# Patient Record
Sex: Female | Born: 1935 | Race: White | Hispanic: No | Marital: Married | Smoking: Never smoker
Health system: Southern US, Community
[De-identification: ages and names within clinical notes are randomized; demographics above are authoritative.]

## PROBLEM LIST (undated history)

## (undated) DIAGNOSIS — I4891 Unspecified atrial fibrillation: Secondary | ICD-10-CM

## (undated) DIAGNOSIS — I10 Essential (primary) hypertension: Secondary | ICD-10-CM

## (undated) DIAGNOSIS — C50919 Malignant neoplasm of unspecified site of unspecified female breast: Secondary | ICD-10-CM

## (undated) DIAGNOSIS — S32010A Wedge compression fracture of first lumbar vertebra, initial encounter for closed fracture: Secondary | ICD-10-CM

## (undated) DIAGNOSIS — G8929 Other chronic pain: Secondary | ICD-10-CM

## (undated) DIAGNOSIS — M5430 Sciatica, unspecified side: Secondary | ICD-10-CM

## (undated) DIAGNOSIS — M1990 Unspecified osteoarthritis, unspecified site: Secondary | ICD-10-CM

## (undated) DIAGNOSIS — M549 Dorsalgia, unspecified: Secondary | ICD-10-CM

## (undated) HISTORY — DX: Malignant neoplasm of unspecified site of unspecified female breast: C50.919

## (undated) HISTORY — PX: APPENDECTOMY: SHX54

## (undated) HISTORY — DX: Unspecified osteoarthritis, unspecified site: M19.90

## (undated) HISTORY — DX: Essential (primary) hypertension: I10

---

## 2002-11-17 DIAGNOSIS — S32010A Wedge compression fracture of first lumbar vertebra, initial encounter for closed fracture: Secondary | ICD-10-CM

## 2002-11-17 HISTORY — DX: Wedge compression fracture of first lumbar vertebra, initial encounter for closed fracture: S32.010A

## 2003-06-13 ENCOUNTER — Emergency Department (HOSPITAL_COMMUNITY): Admission: AD | Admit: 2003-06-13 | Discharge: 2003-06-13 | Payer: Self-pay | Admitting: Emergency Medicine

## 2003-06-13 ENCOUNTER — Encounter: Payer: Self-pay | Admitting: Emergency Medicine

## 2003-08-14 ENCOUNTER — Ambulatory Visit (HOSPITAL_COMMUNITY): Admission: RE | Admit: 2003-08-14 | Discharge: 2003-08-14 | Payer: Self-pay | Admitting: Unknown Physician Specialty

## 2003-08-14 ENCOUNTER — Encounter: Payer: Self-pay | Admitting: Unknown Physician Specialty

## 2004-09-26 ENCOUNTER — Ambulatory Visit: Payer: Self-pay | Admitting: Family Medicine

## 2005-01-07 ENCOUNTER — Encounter: Admission: RE | Admit: 2005-01-07 | Discharge: 2005-01-07 | Payer: Self-pay | Admitting: Rheumatology

## 2005-01-28 ENCOUNTER — Ambulatory Visit: Payer: Self-pay | Admitting: Family Medicine

## 2005-04-09 ENCOUNTER — Ambulatory Visit: Payer: Self-pay | Admitting: Family Medicine

## 2005-08-22 ENCOUNTER — Ambulatory Visit: Payer: Self-pay | Admitting: Family Medicine

## 2005-09-09 ENCOUNTER — Ambulatory Visit: Payer: Self-pay | Admitting: Family Medicine

## 2005-09-15 ENCOUNTER — Ambulatory Visit: Payer: Self-pay | Admitting: Family Medicine

## 2006-01-07 ENCOUNTER — Ambulatory Visit: Payer: Self-pay | Admitting: Family Medicine

## 2006-05-14 ENCOUNTER — Ambulatory Visit: Payer: Self-pay | Admitting: Family Medicine

## 2006-07-02 ENCOUNTER — Ambulatory Visit: Payer: Self-pay | Admitting: Family Medicine

## 2006-08-13 ENCOUNTER — Ambulatory Visit (HOSPITAL_COMMUNITY): Admission: RE | Admit: 2006-08-13 | Discharge: 2006-08-13 | Payer: Self-pay | Admitting: Ophthalmology

## 2006-09-11 ENCOUNTER — Ambulatory Visit: Payer: Self-pay | Admitting: Family Medicine

## 2006-11-02 ENCOUNTER — Ambulatory Visit: Payer: Self-pay | Admitting: Family Medicine

## 2007-01-22 ENCOUNTER — Ambulatory Visit: Payer: Self-pay | Admitting: Family Medicine

## 2007-01-26 ENCOUNTER — Ambulatory Visit: Payer: Self-pay | Admitting: Family Medicine

## 2007-01-29 ENCOUNTER — Ambulatory Visit: Payer: Self-pay | Admitting: Family Medicine

## 2007-02-02 ENCOUNTER — Ambulatory Visit: Payer: Self-pay | Admitting: Family Medicine

## 2007-04-15 ENCOUNTER — Ambulatory Visit (HOSPITAL_COMMUNITY): Admission: RE | Admit: 2007-04-15 | Discharge: 2007-04-15 | Payer: Self-pay | Admitting: Ophthalmology

## 2007-05-11 ENCOUNTER — Ambulatory Visit: Payer: Self-pay | Admitting: Family Medicine

## 2008-08-15 ENCOUNTER — Encounter: Admission: RE | Admit: 2008-08-15 | Discharge: 2008-08-15 | Payer: Self-pay | Admitting: Rheumatology

## 2008-09-01 ENCOUNTER — Encounter: Admission: RE | Admit: 2008-09-01 | Discharge: 2008-09-01 | Payer: Self-pay | Admitting: Rheumatology

## 2008-09-13 ENCOUNTER — Encounter: Admission: RE | Admit: 2008-09-13 | Discharge: 2008-09-13 | Payer: Self-pay | Admitting: Rheumatology

## 2008-11-08 ENCOUNTER — Ambulatory Visit (HOSPITAL_COMMUNITY): Admission: RE | Admit: 2008-11-08 | Discharge: 2008-11-08 | Payer: Self-pay | Admitting: Neurosurgery

## 2010-02-28 ENCOUNTER — Encounter: Admission: RE | Admit: 2010-02-28 | Discharge: 2010-02-28 | Payer: Self-pay | Admitting: Rheumatology

## 2010-03-04 ENCOUNTER — Encounter: Admission: RE | Admit: 2010-03-04 | Discharge: 2010-03-04 | Payer: Self-pay | Admitting: Rheumatology

## 2010-03-20 ENCOUNTER — Encounter: Admission: RE | Admit: 2010-03-20 | Discharge: 2010-03-20 | Payer: Self-pay | Admitting: Rheumatology

## 2010-04-11 ENCOUNTER — Encounter: Admission: RE | Admit: 2010-04-11 | Discharge: 2010-04-11 | Payer: Self-pay | Admitting: Rheumatology

## 2010-06-13 ENCOUNTER — Encounter (HOSPITAL_COMMUNITY): Admission: RE | Admit: 2010-06-13 | Discharge: 2010-08-08 | Payer: Self-pay | Admitting: Neurosurgery

## 2010-09-04 ENCOUNTER — Encounter: Admission: RE | Admit: 2010-09-04 | Discharge: 2010-09-04 | Payer: Self-pay | Admitting: Neurosurgery

## 2011-04-04 NOTE — Op Note (Signed)
NAME:  Betty Carpenter, Betty Carpenter                   ACCOUNT NO.:  [id number]   MEDICAL RECORD NO.:  [id number]          PATIENT TYPE:  AMB   LOCATION:  DAY                           FACILITY:  [facility]   PHYSICIAN:  Susanne Greenhouse, MD       DATE OF BIRTH:  06-14-1925   DATE OF PROCEDURE:  08/13/2006  DATE OF DISCHARGE:  08/13/2006                                 OPERATIVE REPORT   PREOPERATIVE DIAGNOSES:  Nuclear cataract left eye.   POSTOPERATIVE DIAGNOSES:  Nuclear cataract left eye.   OPERATIVE PROCEDURE:  Phacoemulsification, intraocular lens implantation  left eye.   SURGEON:  Susanne Greenhouse, MD   ANESTHESIA:  Topical with monitored anesthesia care.   OPERATIVE SUMMARY:  In the preoperative holding area, dilating drops and 2%  viscous lidocaine were placed into the left eye.  The patient was brought to  the operating room where the eye was prepped and draped.  A super sharp  blade was used to make a paracentesis port at the surgeon's 2 o'clock  position.  The anterior chamber was filled with 1% non-preserved lidocaine  solution followed by Amvisc plus.  A 2.85 mm keratome blade was used to make  a clear corneal incision at the supratemporal limbus.  A cystotome needle  was used to create a continuous tear capsulotomy.  Hydrodissection was  performed using balanced salt solution on a fine cannula.  The lens nucleus  was then removed using phacoemulsification in a cracking technique.  Residual cortex was removed with irrigation and aspiration.  The capsular  bag and anterior chamber were refilled with Amvisc plus.  A posterior  chamber intraocular lens was placed into the capsular bag using a Monarch  lens injecting system.  The Amvisc plus was then removed from the capsular  bag and anterior chamber with irrigation and aspiration.  Stromal hydration  of the main incision and paracentesis ports were performed with balanced  salt solution on a fine cannula.  The wound was tested for leak which was  negative.  No sutures were placed.  There were no operative complications.  The patient tolerated the procedure well and was returned to the recovery  area in satisfactory condition.  Prosthetic devices and Alcon AcrySof  posterior chamber intraocular lens model [id number], power [id number].           ______________________________  Susanne Greenhouse, MD     KEH/MEDQ  D:  08/13/2006  T:  08/15/2006  Job:  [id number]

## 2012-01-07 ENCOUNTER — Telehealth: Payer: Self-pay | Admitting: *Deleted

## 2012-01-07 NOTE — Telephone Encounter (Signed)
Patient's daughter called me back and I confirmed 01/28/12 appt w/ Nathanial Rancher.  Mailed before appt letter & packet to pt.

## 2012-01-28 ENCOUNTER — Ambulatory Visit: Payer: Medicare Other

## 2012-01-28 ENCOUNTER — Ambulatory Visit (HOSPITAL_BASED_OUTPATIENT_CLINIC_OR_DEPARTMENT_OTHER): Payer: Medicare Other | Admitting: Oncology

## 2012-01-28 ENCOUNTER — Encounter: Payer: Self-pay | Admitting: Oncology

## 2012-01-28 ENCOUNTER — Other Ambulatory Visit: Payer: Medicare Other

## 2012-01-28 VITALS — BP 144/70 | HR 75 | Temp 98.1°F | Ht 71.0 in | Wt 137.3 lb

## 2012-01-28 DIAGNOSIS — C50919 Malignant neoplasm of unspecified site of unspecified female breast: Secondary | ICD-10-CM

## 2012-01-28 DIAGNOSIS — Z171 Estrogen receptor negative status [ER-]: Secondary | ICD-10-CM

## 2012-01-28 NOTE — Patient Instructions (Signed)
1. You are scheduled to see Dr. Deatra Ina in [place] for discussion of breast surgery  2. You will see a medical oncologist in [place] for medical oncology services. I have discussed your case with Dr. Normand Sloop and his office will call you to set up an appointment

## 2012-01-28 NOTE — Progress Notes (Signed)
Did inform patient about financial application patient was interested so I gave patient EPP Application 01/28/12.

## 2012-01-29 ENCOUNTER — Telehealth: Payer: Self-pay | Admitting: Oncology

## 2012-01-29 NOTE — Progress Notes (Signed)
OFFICE PROGRESS NOTE  CC Dr. 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  CURRENT THERAPY:patient is undergoing evaluation and discussion of treatment options. She was seen as a new consultation.  INTERVAL HISTORY: Nathalia Wismer Buck 78 y.o. female with multiple medical problems including hypertension chronic back pain. The patient recently was seen in [place] for her annual screening mammogram. She was found to have a 1.4 cm right breast mass. She went on to have an ultrasound performed that showed a 1.4 cm hypoechoic mass. She does have a needle core biopsy performed in [place]. The pathology showed a invasive ductal carcinoma that was ER ER negative PR negative HER-2/neu negative (to triple negative). She was then referred to medical oncology for discussion of treatment options.  MEDICAL HISTORY: Past Medical History  Diagnosis Date  . Breast cancer   . Arthritis   . Hypertension     ALLERGIES:  is allergic to cymbalta.  MEDICATIONS:  Current Outpatient Prescriptions  Medication Sig Dispense Refill  . amLODipine (NORVASC) 5 MG tablet Take 5 mg by mouth.      Marland Kitchen aspirin 81 MG tablet Take 81 mg by mouth daily.      . Calcium Citrate-Vitamin D (CITRACAL + D PO) Take by mouth.      . fish oil-omega-3 fatty acids 1000 MG capsule Take 2 g by mouth daily.      Marland Kitchen lidocaine (LIDODERM) 5 % Place 1 patch onto the skin daily. Remove & Discard patch within 12 hours or as directed by MD      . LORazepam (ATIVAN) 0.5 MG tablet Take 0.5 mg by mouth every 8 (eight) hours as needed.      . magnesium 30 MG tablet Take 30 mg by mouth 3 (three) times daily.      . Multiple Vitamins-Minerals (CENTRUM SILVER PO) Take by mouth.      . oxyCODONE-acetaminophen (PERCOCET) 10-325 MG per tablet Take 1 tablet by mouth every 6 (six) hours as needed.        SURGICAL HISTORY:  Past  Surgical History  Procedure Date  . Appendectomy     REVIEW OF SYSTEMS:  Constitutional: positive for anorexia, fatigue and malaise Ears, nose, mouth, throat, and face: positive for patient has poor dentition but no difficulty in swallowing she has no hearing problems she has not noticed any sores in her mouth. Respiratory: positive for minimal shortness of breath on exertion Cardiovascular: negative Gastrointestinal: negative Integument/breast: positive for breast lump and breast tenderness Hematologic/lymphatic: negative Musculoskeletal:positive for arthralgias, back pain, bone pain, muscle weakness, myalgias and stiff joints Neurological: positive for coordination problems, gait problems, tremors and weakness   PHYSICAL EXAMINATION: General appearance: alert, cooperative, appears older than stated age, cachectic, moderate distress, pale and slowed mentation Neck: no adenopathy, no carotid bruit, no JVD, supple, symmetrical, trachea midline and thyroid not enlarged, symmetric, no tenderness/mass/nodules Lymph nodes: Cervical, supraclavicular, and axillary nodes normal. Resp: diminished breath sounds bilaterally Back: patient has back pain on percussion Cardio: regular rate and rhythm GI: soft, non-tender; bowel sounds normal; no masses,  no organomegaly Extremities: extremities normal, atraumatic, no cyanosis or edema Neurologic: Grossly normal Bilateral breast examination: right breast reveals a small palpable mass with no other associated masses no nipple discharge or inversion or retraction. ECOG PERFORMANCE STATUS: 2 - Symptomatic, <50% confined to bed  Blood pressure 144/70, pulse 75, temperature 98.1  F (36.7 C), temperature source Oral, height 5\' 11"  (1.803 m), weight 137 lb 4.8 oz (62.279 kg).  LABORATORY DATA: No results found for this basename: WBC, HGB, HCT, MCV, PLT      Chemistry   No results found for this basename: NA, K, CL, CO2, BUN, CREATININE, GLU   No results  found for this basename: CALCIUM, ALKPHOS, AST, ALT, BILITOT       RADIOGRAPHIC STUDIES:  No results found.  ASSESSMENT: 78 year old female with new diagnosis of triple-negative breast cancer. Patient has not been seen by surgery. I discussed the diagnosis with the patient and her daughter. He understands that she has a invasive ductal carcinoma the tumor is estrogen receptor progesterone receptor and HER-2/neu negative negative. They understand that she does need surgery up front. They understand that she is not a candidate for antiestrogen therapy due to the fact that her tumor is triple negative.   PLAN: I discussed extensively patient's pathology report with them. I also recommended that she be seen by a surgeon. Since patient lives in [place] my recommendation is that she be seen in [place] by one of the surgeons. An appointment with Dr. Olegario Messier was made for her for March 20. I also recommended the patient be seen and [place] at the cancer [facility]. I spoke to one of my partners regarding this patient. And he will have his office to see and call the patient in the next few days for a appointment for a full medical oncology evaluation. No recommendations for any kind of adjuvant treatments were made for the patient as I will be for this to my partners.   All questions were answered. The patient knows to call the clinic with any problems, questions or concerns. We can certainly see the patient much sooner if necessary.  I spent 40 minutes counseling the patient face to face. The total time spent in the appointment was 40 minutes.    Drue Second, MD Medical/Oncology [facility] [phone] (beeper) [phone] (Office)  01/29/2012, 6:26 PM

## 2012-01-29 NOTE — Telephone Encounter (Signed)
Please refer to dr Milta Deiters notes in epic regarding this pt transferring her care of serviice in [place] with dr Deatra Ina

## 2012-02-09 ENCOUNTER — Encounter: Payer: Self-pay | Admitting: *Deleted

## 2012-02-09 NOTE — Progress Notes (Signed)
Mailed after appt letter to pt. 

## 2012-04-01 ENCOUNTER — Encounter: Payer: Medicare Other | Admitting: Internal Medicine

## 2012-04-01 DIAGNOSIS — C50919 Malignant neoplasm of unspecified site of unspecified female breast: Secondary | ICD-10-CM

## 2012-10-28 ENCOUNTER — Encounter: Payer: Medicare Other | Admitting: Internal Medicine

## 2012-11-30 ENCOUNTER — Encounter: Payer: Medicare Other | Admitting: Internal Medicine

## 2012-11-30 DIAGNOSIS — C50919 Malignant neoplasm of unspecified site of unspecified female breast: Secondary | ICD-10-CM

## 2013-05-31 DIAGNOSIS — C50919 Malignant neoplasm of unspecified site of unspecified female breast: Secondary | ICD-10-CM

## 2014-11-28 DIAGNOSIS — N3946 Mixed incontinence: Secondary | ICD-10-CM | POA: Insufficient documentation

## 2015-02-13 DIAGNOSIS — H1132 Conjunctival hemorrhage, left eye: Secondary | ICD-10-CM | POA: Insufficient documentation

## 2015-09-17 DIAGNOSIS — Z9011 Acquired absence of right breast and nipple: Secondary | ICD-10-CM | POA: Insufficient documentation

## 2016-01-15 DIAGNOSIS — E785 Hyperlipidemia, unspecified: Secondary | ICD-10-CM | POA: Insufficient documentation

## 2016-05-13 DIAGNOSIS — D510 Vitamin B12 deficiency anemia due to intrinsic factor deficiency: Secondary | ICD-10-CM | POA: Insufficient documentation

## 2016-05-13 DIAGNOSIS — R5382 Chronic fatigue, unspecified: Secondary | ICD-10-CM | POA: Insufficient documentation

## 2016-08-18 DIAGNOSIS — D649 Anemia, unspecified: Secondary | ICD-10-CM | POA: Insufficient documentation

## 2017-10-22 ENCOUNTER — Inpatient Hospital Stay (HOSPITAL_COMMUNITY)
Admission: AD | Admit: 2017-10-22 | Discharge: 2017-10-27 | DRG: 871 | Disposition: A | Discharge status: Skilled Nursing Facility | Payer: Medicare Other | Source: Other Acute Inpatient Hospital | Attending: Internal Medicine | Admitting: Internal Medicine

## 2017-10-22 DIAGNOSIS — G9341 Metabolic encephalopathy: Secondary | ICD-10-CM | POA: Diagnosis present

## 2017-10-22 DIAGNOSIS — I10 Essential (primary) hypertension: Secondary | ICD-10-CM | POA: Diagnosis present

## 2017-10-22 DIAGNOSIS — I160 Hypertensive urgency: Secondary | ICD-10-CM | POA: Diagnosis present

## 2017-10-22 DIAGNOSIS — Z66 Do not resuscitate: Secondary | ICD-10-CM | POA: Diagnosis present

## 2017-10-22 DIAGNOSIS — Z853 Personal history of malignant neoplasm of breast: Secondary | ICD-10-CM

## 2017-10-22 DIAGNOSIS — A419 Sepsis, unspecified organism: Secondary | ICD-10-CM | POA: Diagnosis present

## 2017-10-22 DIAGNOSIS — K529 Noninfective gastroenteritis and colitis, unspecified: Secondary | ICD-10-CM | POA: Diagnosis present

## 2017-10-22 DIAGNOSIS — Z86718 Personal history of other venous thrombosis and embolism: Secondary | ICD-10-CM | POA: Diagnosis not present

## 2017-10-22 DIAGNOSIS — K8030 Calculus of bile duct with cholangitis, unspecified, without obstruction: Secondary | ICD-10-CM | POA: Diagnosis present

## 2017-10-22 DIAGNOSIS — F419 Anxiety disorder, unspecified: Secondary | ICD-10-CM | POA: Diagnosis present

## 2017-10-22 DIAGNOSIS — Z9010 Acquired absence of unspecified breast and nipple: Secondary | ICD-10-CM | POA: Diagnosis not present

## 2017-10-22 DIAGNOSIS — E876 Hypokalemia: Secondary | ICD-10-CM | POA: Diagnosis present

## 2017-10-22 DIAGNOSIS — R748 Abnormal levels of other serum enzymes: Secondary | ICD-10-CM | POA: Diagnosis present

## 2017-10-22 DIAGNOSIS — F17290 Nicotine dependence, other tobacco product, uncomplicated: Secondary | ICD-10-CM | POA: Diagnosis present

## 2017-10-22 DIAGNOSIS — Z7982 Long term (current) use of aspirin: Secondary | ICD-10-CM

## 2017-10-22 DIAGNOSIS — K8689 Other specified diseases of pancreas: Secondary | ICD-10-CM | POA: Diagnosis present

## 2017-10-22 DIAGNOSIS — M549 Dorsalgia, unspecified: Secondary | ICD-10-CM

## 2017-10-22 DIAGNOSIS — G8929 Other chronic pain: Secondary | ICD-10-CM | POA: Diagnosis present

## 2017-10-22 DIAGNOSIS — Z79891 Long term (current) use of opiate analgesic: Secondary | ICD-10-CM | POA: Diagnosis not present

## 2017-10-22 DIAGNOSIS — M545 Low back pain: Secondary | ICD-10-CM | POA: Diagnosis not present

## 2017-10-22 DIAGNOSIS — F1190 Opioid use, unspecified, uncomplicated: Secondary | ICD-10-CM | POA: Diagnosis not present

## 2017-10-22 LAB — CBC WITH DIFFERENTIAL/PLATELET
BASOS ABS: 0 10*3/uL (ref 0.0–0.1)
BASOS PCT: 0 %
EOS PCT: 0 %
Eosinophils Absolute: 0 10*3/uL (ref 0.0–0.7)
HCT: 40.3 % (ref 36.0–46.0)
Hemoglobin: 14.5 g/dL (ref 12.0–15.0)
Lymphocytes Relative: 3 %
Lymphs Abs: 0.5 10*3/uL — ABNORMAL LOW (ref 0.7–4.0)
MCH: 30 pg (ref 26.0–34.0)
MCHC: 36 g/dL (ref 30.0–36.0)
MCV: 83.3 fL (ref 78.0–100.0)
MONO ABS: 1.1 10*3/uL — AB (ref 0.1–1.0)
Monocytes Relative: 6 %
Neutro Abs: 16.5 10*3/uL — ABNORMAL HIGH (ref 1.7–7.7)
Neutrophils Relative %: 91 %
PLATELETS: 156 10*3/uL (ref 150–400)
RBC: 4.84 MIL/uL (ref 3.87–5.11)
RDW: 14 % (ref 11.5–15.5)
WBC: 18.1 10*3/uL — ABNORMAL HIGH (ref 4.0–10.5)

## 2017-10-22 LAB — COMPREHENSIVE METABOLIC PANEL
ALBUMIN: 3.5 g/dL (ref 3.5–5.0)
ALT: 175 U/L — ABNORMAL HIGH (ref 14–54)
ANION GAP: 12 (ref 5–15)
AST: 118 U/L — AB (ref 15–41)
Alkaline Phosphatase: 127 U/L — ABNORMAL HIGH (ref 38–126)
BUN: 26 mg/dL — AB (ref 6–20)
CHLORIDE: 101 mmol/L (ref 101–111)
CO2: 23 mmol/L (ref 22–32)
Calcium: 8.9 mg/dL (ref 8.9–10.3)
Creatinine, Ser: 0.64 mg/dL (ref 0.44–1.00)
GFR calc Af Amer: >60 mL/min (ref >60)
Glucose, Bld: 126 mg/dL — ABNORMAL HIGH (ref 65–99)
POTASSIUM: 2.8 mmol/L — AB (ref 3.5–5.1)
Sodium: 136 mmol/L (ref 135–145)
Total Bilirubin: 2.1 mg/dL — ABNORMAL HIGH (ref 0.3–1.2)
Total Protein: 6.8 g/dL (ref 6.5–8.1)

## 2017-10-22 LAB — LACTIC ACID, PLASMA
LACTIC ACID, VENOUS: 1.4 mmol/L (ref 0.5–1.9)
LACTIC ACID, VENOUS: 0.9 mmol/L (ref 0.5–1.9)

## 2017-10-22 LAB — TYPE AND SCREEN
ABO/RH(D): A POS
ANTIBODY SCREEN: NEGATIVE

## 2017-10-22 LAB — ABO/RH: ABO/RH(D): A POS

## 2017-10-22 MED ORDER — METRONIDAZOLE IN NACL 5-0.79 MG/ML-% IV SOLN
500.0000 mg | Freq: Three times a day (TID) | INTRAVENOUS | Status: DC
  Administered 2017-10-22 – 2017-10-23 (×2): 500 mg via INTRAVENOUS
  Filled 2017-10-22 (×3): qty 100

## 2017-10-22 MED ORDER — HYDROMORPHONE HCL 1 MG/ML IJ SOLN
0.5000 mg | INTRAMUSCULAR | Status: AC
  Administered 2017-10-22: 0.5 mg via INTRAVENOUS
  Filled 2017-10-22: qty 0.5

## 2017-10-22 MED ORDER — ACETAMINOPHEN 325 MG PO TABS
650.0000 mg | ORAL_TABLET | Freq: Four times a day (QID) | ORAL | Status: DC | PRN
  Administered 2017-10-24 – 2017-10-27 (×4): 650 mg via ORAL
  Filled 2017-10-22 (×4): qty 2

## 2017-10-22 MED ORDER — PIPERACILLIN-TAZOBACTAM 3.375 G IVPB 30 MIN
3.3750 g | Freq: Once | INTRAVENOUS | Status: AC
  Administered 2017-10-22: 3.375 g via INTRAVENOUS
  Filled 2017-10-22: qty 50

## 2017-10-22 MED ORDER — ONDANSETRON HCL 4 MG PO TABS: 4.0000 mg | ORAL_TABLET | Freq: Four times a day (QID) | ORAL | Status: DC | PRN

## 2017-10-22 MED ORDER — LABETALOL HCL 5 MG/ML IV SOLN
5.0000 mg | INTRAVENOUS | Status: DC | PRN
  Administered 2017-10-22 – 2017-10-24 (×3): 5 mg via INTRAVENOUS
  Filled 2017-10-22 (×3): qty 4

## 2017-10-22 MED ORDER — LORAZEPAM 2 MG/ML IJ SOLN
0.5000 mg | Freq: Two times a day (BID) | INTRAMUSCULAR | Status: DC | PRN
  Administered 2017-10-23: 0.5 mg via INTRAVENOUS
  Filled 2017-10-22: qty 1

## 2017-10-22 MED ORDER — ONDANSETRON HCL 4 MG/2ML IJ SOLN: 4.0000 mg | Freq: Four times a day (QID) | INTRAMUSCULAR | Status: DC | PRN

## 2017-10-22 MED ORDER — ACETAMINOPHEN 650 MG RE SUPP: 650.0000 mg | Freq: Four times a day (QID) | RECTAL | Status: DC | PRN

## 2017-10-22 MED ORDER — IPRATROPIUM-ALBUTEROL 0.5-2.5 (3) MG/3ML IN SOLN: 3.0000 mL | Freq: Four times a day (QID) | RESPIRATORY_TRACT | Status: DC | PRN

## 2017-10-22 MED ORDER — HYDROMORPHONE HCL 1 MG/ML IJ SOLN
0.5000 mg | INTRAMUSCULAR | Status: DC | PRN
  Administered 2017-10-22 – 2017-10-24 (×7): 0.5 mg via INTRAVENOUS
  Filled 2017-10-22 (×8): qty 0.5

## 2017-10-22 MED ORDER — SODIUM CHLORIDE 0.9 % IV SOLN
INTRAVENOUS | Status: DC
  Administered 2017-10-22: 21:00:00 via INTRAVENOUS

## 2017-10-22 MED ORDER — PIPERACILLIN-TAZOBACTAM 3.375 G IVPB
3.3750 g | Freq: Three times a day (TID) | INTRAVENOUS | Status: DC
  Administered 2017-10-23 – 2017-10-27 (×14): 3.375 g via INTRAVENOUS
  Filled 2017-10-22 (×17): qty 50

## 2017-10-22 NOTE — Progress Notes (Signed)
Pharmacy Antibiotic Note  Betty Carpenter is a 83 y.o. female admitted on 10/22/2017 with intra-abdominal infection.  Pharmacy has been consulted for zosyn dosing.  Plan: Zosyn 3.375mg  IV q8h EI Flagyl 500mg  IV q8h per MD F/u renal function     Temp (24hrs), Avg:98 F (36.7 C), Min:98 F (36.7 C), Max:98 F (36.7 C)  No results for input(s): WBC, CREATININE, LATICACIDVEN, VANCOTROUGH, VANCOPEAK, VANCORANDOM, GENTTROUGH, GENTPEAK, GENTRANDOM, TOBRATROUGH, TOBRAPEAK, TOBRARND, AMIKACINPEAK, AMIKACINTROU, AMIKACIN in the last 168 hours.  CrCl cannot be calculated (No order found.).    Allergies  Allergen Reactions  . Cymbalta [Duloxetine Hcl]     " couldn't make water"- unable to urinate    Antimicrobials this admission: 12/6 zosyn >> 12/6 flagyl >>  Microbiology results: 12/6 BCx: sent   Thank you for allowing pharmacy to be a part of this patient's care.  Dolly Rias RPh 10/22/2017, 9:01 PM Pager [phone]

## 2017-10-22 NOTE — H&P (Signed)
History and Physical    Betty Carpenter NGE:[id number] DOB: [birth year] DOA: 10/22/2017  Referring MD/NP/PA: Dr. Levora Angel PCP: Betty Carpenter, No Pcp Per  Betty Carpenter coming from: Assisted living facility to [facility] transfer to [facility]  Chief Complaint: Abdominal pain  I have personally briefly reviewed Betty Carpenter's old medical records and those records sent with her from [facility].   HPI: Betty Carpenter is a 83 y.o. female with medical history significant of HTN, chronic back pain, chronic opioid use, DVT on anticoagulation of Xarelto, and breast cancer s/p mastectomy; who initially presented to [facility] on Tuesday October 20, 2017 with complaints of severe abdominal pain with nausea and vomiting.  Betty Carpenter vomited several times prior to arrival and it was noted to be nonbloody in appearance.  Associated symptoms included at least one episode of loose stools, and back pain which is chronic.  Prior to onset of symptoms Betty Carpenter had been in her normal state of health, and is a resident of [facility] in and ambulates utilizing a walker.   After initial evaluation Betty Carpenter was noted to have a lab work on 12/4 showed lactic acid of 2.7 with a repeat trending upward to 3.1.White blood cell count count was initially noted to be normal at 6. CMP with GFR was noted to be relatively within normal limits, except anion gap 19, total bilirubin 2.2, AST 398.7, ALT 220, alkaline phosphatase 110, albumin 4.9.  Previous records noted that these levels have been previously normal. Urinalysis showed signs of trace protein, small amount of blood, urobilinogen high at 4, and trace leukocyte esterase.   CT scan of the abdomen showed diffuse colonic wall thickening and pericolonic fat stranding compatible with mild colitis and severe intrahepatic biliary duct dilatation with common bile duct dilatation of 15 mm and main pancreatic duct dilatation to 7 mm progressed from 2015, and a new 7 mm cystic  lesion in the pancreatic body.  Blood and urine cultures were obtained and Betty Carpenter was started on empiric antibiotics of Zosyn.   Due to persistent nausea and vomiting NG tube ultimately had to be placed to low intermittent suction.  Family notes general surgery was consulted and recommended MRCP. MRCP was obtained on hospital day 2, but did not show any clear signs of a calculus within the distal common bile duct.   there was persistent dilatation of the common bile duct and intrahepatic ducts which was mildly progressed and periampullary duodenal diverticulum noted.  Labs from 12/6 revealed WBC 19.5, hemoglobin 16, platelets 192, INR 1.3, sodium 136, potassium 3.4, chloride 96, CO2 21.5, BUN 25, creatinine 0.71, total bilirubin 4.2, direct bilirubin 3, indirect 1.2, AST 225.8, ALT 274, alkaline phosphatase 180, lipase 19.  General surgery recommended GI consultation, but those services were not available at the facility.  [facility] called to transfer the Betty [facility] to [facility] for need of ERCP and GI consultative services.   ED Course: as seen above  Review of Systems  Constitutional: Positive for chills, malaise/fatigue and weight loss.  HENT: Negative for ear discharge and nosebleeds.   Eyes: Negative for pain and discharge.  Respiratory: Negative for hemoptysis and sputum production.   Cardiovascular: Negative for chest pain and leg swelling.  Gastrointestinal: Positive for abdominal pain, diarrhea, nausea and vomiting.  Genitourinary: Negative for dysuria and urgency.  Musculoskeletal: Positive for back pain.  Skin: Negative for itching.  Neurological: Negative for focal weakness and loss of consciousness.    Past Medical History:  Diagnosis  Date  . Arthritis   . Breast cancer   . Hypertension     Past Surgical History:  Procedure Laterality Date  . APPENDECTOMY       reports that  has never smoked. she has never used smokeless tobacco. Her alcohol and drug histories are not on  file.  Allergies  Allergen Reactions  . Cymbalta [Duloxetine Hcl]     " couldn't make water"- unable to urinate    Family History  Problem Relation Age of Onset  . Cancer Maternal Grandfather     Prior to Admission medications   Medication Sig Start Date End Date Taking? Authorizing Provider  amLODipine (NORVASC) 5 MG tablet Take 5 mg by mouth.    [provider]  aspirin 81 MG tablet Take 81 mg by mouth daily.    [provider]  Calcium Citrate-Vitamin D (CITRACAL + D PO) Take by mouth.    [provider]  fish oil-omega-3 fatty acids 1000 MG capsule Take 2 g by mouth daily.    [provider]  lidocaine (LIDODERM) 5 % Place 1 patch onto the skin daily. Remove & Discard patch within 12 hours or as directed by MD    [provider]  LORazepam (ATIVAN) 0.5 MG tablet Take 0.5 mg by mouth every 8 (eight) hours as needed.    [provider]  magnesium 30 MG tablet Take 30 mg by mouth 3 (three) times daily.    [provider]  Multiple Vitamins-Minerals (CENTRUM SILVER PO) Take by mouth.    [provider]  oxyCODONE-acetaminophen (PERCOCET) 10-325 MG per tablet Take 1 tablet by mouth every 6 (six) hours as needed.    [provider]    Physical Exam:  Constitutional: frail elderly female in mild discomfort.  Vitals:   10/22/17 1915  BP: (!) (P) 183/98  Pulse: (!) (P) 115  Temp: (P) 98 F (36.7 C)  TempSrc: (P) Oral  SpO2: (P) 98%   Eyes: PERRL, lids and conjunctivae normal ENMT: Mucous membranes are dry. NGT in place. Posterior pharynx clear of any exudate or lesions. Neck: normal, supple, no masses, no thyromegaly Respiratory: clear to auscultation bilaterally, no wheezing, no crackles. Normal respiratory effort. No accessory muscle use.  Cardiovascular: Tachycardic, no murmurs / rubs / gallops. No extremity edema. 2+ pedal pulses. No carotid bruits.  Abdomen: no tenderness, no masses palpated.  No hepatosplenomegaly. Bowel sounds positive.  Musculoskeletal: no clubbing / cyanosis. No joint deformity upper and lower extremities. Good ROM, no contractures. Normal muscle tone.  Skin: no rashes, lesions, ulcers. No induration Neurologic: CN 2-12 grossly intact. Sensation intact, DTR normal. Strength 5/5 in all 4.  Psychiatric: Normal judgment and insight. Alert and oriented x 3. Normal mood.     Labs on Admission: I have personally reviewed following labs and imaging studies  CBC: No results for input(s): WBC, NEUTROABS, HGB, HCT, MCV, PLT in the last 168 hours. Basic Metabolic Panel: No results for input(s): NA, K, CL, CO2, GLUCOSE, BUN, CREATININE, CALCIUM, MG, PHOS in the last 168 hours. GFR: CrCl cannot be calculated (No order found.). Liver Function Tests: No results for input(s): AST, ALT, ALKPHOS, BILITOT, PROT, ALBUMIN in the last 168 hours. No results for input(s): LIPASE, AMYLASE in the last 168 hours. No results for input(s): AMMONIA in the last 168 hours. Coagulation Profile: No results for input(s): INR, PROTIME in the last 168 hours. Cardiac Enzymes: No results for input(s): CKTOTAL, CKMB, CKMBINDEX, TROPONINI in the last 168  hours. BNP (last 3 results) No results for input(s): PROBNP in the last 8760 hours. HbA1C: No results for input(s): HGBA1C in the last 72 hours. CBG: No results for input(s): GLUCAP in the last 168 hours. Lipid Profile: No results for input(s): CHOL, HDL, LDLCALC, TRIG, CHOLHDL, LDLDIRECT in the last 72 hours. Thyroid Function Tests: No results for input(s): TSH, T4TOTAL, FREET4, T3FREE, THYROIDAB in the last 72 hours. Anemia Panel: No results for input(s): VITAMINB12, FOLATE, FERRITIN, TIBC, IRON, RETICCTPCT in the last 72 hours. Urine analysis: No results found for: COLORURINE, APPEARANCEUR, LABSPEC, PHURINE, GLUCOSEU, HGBUR, BILIRUBINUR, KETONESUR, PROTEINUR, UROBILINOGEN, NITRITE, LEUKOCYTESUR Sepsis Labs: No results found for this  or any previous visit (from the past 240 hour(s)).   Radiological Exams on Admission: No results found.  EKG: Independently reviewed.  Normal sinus rhythm  Assessment/Plan Sepsis 2/2suspected ascending cholangitis, abdominal pain,N/V: Acute.  Betty Carpenter presented with symptoms of acute onset of abdominal pain with nausea and vomiting.    Betty Carpenter met sepsis criteria with tachycardia, WBC elevated up to 19.5, and lactic acid seen as high as 3.1 at the outside facility. Workup showed significant dilatation of the biliary and pancreatic ducts, but no stones seen on MRCP. Transferred to our facility for need of GI consultative services and possible ERCP. - Admit to telemetry bed - NPO - Continue NGT to intermittent suction - Antiemetics prn N/V - recheck blood cultures - [facility] with [facility] regarding urine/blood culture results - Stat lactic acid level  - Continue Zosyn per pharmacy  - Consulted Dr. Percell Miller of [facility], recommended addition of metronidazole antibiotics and plan for ERCP in a.m.  Acute encephalopathy: Betty Carpenter intermittently confused.  Question of symptoms secondary to acute infection. Unclear if there is baseline dementia and this could possibly be sundowning. - Neurochecks   Hypertensive urgency: Initial blood pressures noted systolics as high as 244. - IV labetalol 5 mg  prn >sbp180 or >dBP 110  Anxiety - Ativan IV prn Anxiety  Chronic back pain with chronic opioid use:  Betty Carpenter suffers from low back pain history of bulging disks.  Previously taking 30 mg of morphine sulfate twice daily along with 20 mg of oxycodone prn q4hr prn pain. - IV Dilaudid prn pain - possibly consider need of PCA pump   History of DVT: Betty Carpenter off Xarelto since 10/21/2017.  Currently holding for possible need of ERCP in a.m. - Restart anticoagulation when able  History of breast cancer: Betty Carpenter status post mastectomy and reportedly did not require chemoradiation.  Tobacco abuse:  Daughter notes that the Betty Carpenter dips snuff. - May consider nicotine patch  DVT prophylaxis: SCDs Code Status: DNR/DNI Family Communication: Discussed plan of care with Betty Carpenter and family present at bedside Disposition Plan: TBD Consults called: GI consult Admission status: Inpatient   Norval Morton MD [facility] Pager [phone]   If 7PM-7AM, please contact night-coverage [email or web address] Password [email or web address]  10/22/2017, 8:02 PM

## 2017-10-23 ENCOUNTER — Inpatient Hospital Stay (HOSPITAL_COMMUNITY): Payer: Medicare Other

## 2017-10-23 ENCOUNTER — Encounter (HOSPITAL_COMMUNITY): Payer: Self-pay

## 2017-10-23 ENCOUNTER — Other Ambulatory Visit: Payer: Self-pay

## 2017-10-23 DIAGNOSIS — I10 Essential (primary) hypertension: Secondary | ICD-10-CM | POA: Insufficient documentation

## 2017-10-23 DIAGNOSIS — Z86718 Personal history of other venous thrombosis and embolism: Secondary | ICD-10-CM

## 2017-10-23 DIAGNOSIS — Z853 Personal history of malignant neoplasm of breast: Secondary | ICD-10-CM

## 2017-10-23 DIAGNOSIS — I160 Hypertensive urgency: Secondary | ICD-10-CM | POA: Diagnosis present

## 2017-10-23 DIAGNOSIS — F1190 Opioid use, unspecified, uncomplicated: Secondary | ICD-10-CM | POA: Diagnosis present

## 2017-10-23 DIAGNOSIS — M549 Dorsalgia, unspecified: Secondary | ICD-10-CM

## 2017-10-23 DIAGNOSIS — G8929 Other chronic pain: Secondary | ICD-10-CM | POA: Diagnosis present

## 2017-10-23 LAB — CBC WITH DIFFERENTIAL/PLATELET
Basophils Absolute: 0 10*3/uL (ref 0.0–0.1)
Basophils Relative: 0 %
EOS PCT: 0 %
Eosinophils Absolute: 0 10*3/uL (ref 0.0–0.7)
HCT: 42.3 % (ref 36.0–46.0)
Hemoglobin: 14.7 g/dL (ref 12.0–15.0)
LYMPHS ABS: 0.8 10*3/uL (ref 0.7–4.0)
LYMPHS PCT: 5 %
MCH: 29.2 pg (ref 26.0–34.0)
MCHC: 34.8 g/dL (ref 30.0–36.0)
MCV: 83.9 fL (ref 78.0–100.0)
MONO ABS: 0.3 10*3/uL (ref 0.1–1.0)
MONOS PCT: 2 %
Neutro Abs: 13.9 10*3/uL — ABNORMAL HIGH (ref 1.7–7.7)
Neutrophils Relative %: 93 %
PLATELETS: 144 10*3/uL — AB (ref 150–400)
RBC: 5.04 MIL/uL (ref 3.87–5.11)
RDW: 14 % (ref 11.5–15.5)
WBC: 15 10*3/uL — AB (ref 4.0–10.5)

## 2017-10-23 LAB — MRSA PCR SCREENING: MRSA BY PCR: NEGATIVE

## 2017-10-23 LAB — COMPREHENSIVE METABOLIC PANEL
ALK PHOS: 124 U/L (ref 38–126)
ALT: 151 U/L — AB (ref 14–54)
ANION GAP: 10 (ref 5–15)
AST: 98 U/L — ABNORMAL HIGH (ref 15–41)
Albumin: 3.7 g/dL (ref 3.5–5.0)
BUN: 26 mg/dL — ABNORMAL HIGH (ref 6–20)
CALCIUM: 8.8 mg/dL — AB (ref 8.9–10.3)
CHLORIDE: 103 mmol/L (ref 101–111)
CO2: 24 mmol/L (ref 22–32)
CREATININE: 0.72 mg/dL (ref 0.44–1.00)
Glucose, Bld: 124 mg/dL — ABNORMAL HIGH (ref 65–99)
Potassium: 3.1 mmol/L — ABNORMAL LOW (ref 3.5–5.1)
Sodium: 137 mmol/L (ref 135–145)
Total Bilirubin: 1.5 mg/dL — ABNORMAL HIGH (ref 0.3–1.2)
Total Protein: 6.8 g/dL (ref 6.5–8.1)

## 2017-10-23 LAB — MAGNESIUM: Magnesium: 2.4 mg/dL (ref 1.7–2.4)

## 2017-10-23 MED ORDER — AMLODIPINE BESYLATE 10 MG PO TABS
10.0000 mg | ORAL_TABLET | Freq: Every day | ORAL | Status: DC
  Administered 2017-10-23 – 2017-10-27 (×5): 10 mg via ORAL
  Filled 2017-10-23 (×5): qty 1

## 2017-10-23 MED ORDER — ALBUMIN HUMAN 25 % IV SOLN
12.5000 g | INTRAVENOUS | Status: AC
  Administered 2017-10-23: 12.5 g via INTRAVENOUS
  Filled 2017-10-23: qty 50

## 2017-10-23 MED ORDER — POTASSIUM CHLORIDE IN NACL 40-0.9 MEQ/L-% IV SOLN
INTRAVENOUS | Status: DC
  Administered 2017-10-23 – 2017-10-24 (×2): 75 mL/h via INTRAVENOUS
  Filled 2017-10-23 (×2): qty 1000

## 2017-10-23 MED ORDER — POTASSIUM CHLORIDE 10 MEQ/100ML IV SOLN
10.0000 meq | INTRAVENOUS | Status: AC
  Administered 2017-10-23 (×3): 10 meq via INTRAVENOUS
  Filled 2017-10-23 (×3): qty 100

## 2017-10-23 MED ORDER — POTASSIUM CHLORIDE IN NACL 40-0.9 MEQ/L-% IV SOLN
INTRAVENOUS | Status: DC
  Administered 2017-10-23: 100 mL/h via INTRAVENOUS
  Filled 2017-10-23 (×2): qty 1000

## 2017-10-23 MED ORDER — MAGNESIUM SULFATE 2 GM/50ML IV SOLN
2.0000 g | Freq: Once | INTRAVENOUS | Status: AC
  Administered 2017-10-23: 2 g via INTRAVENOUS
  Filled 2017-10-23: qty 50

## 2017-10-23 MED ORDER — SODIUM CHLORIDE 0.9 % IV SOLN: INTRAVENOUS | Status: DC

## 2017-10-23 NOTE — Progress Notes (Signed)
[facility] PROGRESS NOTE  Betty Carpenter DXA:[id number] DOB: [birth year] DOA: 10/22/2017 PCP: Patient, No Pcp Per  Brief summary   83 y.o. female with medical history significant of HTN, chronic back pain, chronic opioid use, DVT on anticoagulation of Xarelto, and breast cancer s/p mastectomy; who initially presented to [facility] on Tuesday October 20, 2017 with complaints of severe abdominal pain with nausea and vomiting.  Patient vomited several times prior to arrival and it was noted to be nonbloody in appearance.    After initial evaluation patient was noted to have a lab work on 12/4 showed lactic acid of 2.7 with a repeat trending upward to 3.1.White blood cell count count was initially noted to be normal at 6. CMP with GFR was noted to be relatively within normal limits, except anion gap 19, total bilirubin 2.2, AST 398.7, ALT 220, alkaline phosphatase 110, albumin 4.9.  Previous records noted that these levels have been previously normal. Urinalysis showed signs of trace protein, small amount of blood, urobilinogen high at 4, and trace leukocyte esterase.   CT scan of the abdomen showed diffuse colonic wall thickening and pericolonic fat stranding compatible with mild colitis and severe intrahepatic biliary duct dilatation with common bile duct dilatation of 15 mm and main pancreatic duct dilatation to 7 mm progressed from 2015, and a new 7 mm cystic lesion in the pancreatic body.  Blood and urine cultures were obtained and patient was started on empiric antibiotics of Zosyn.   Due to persistent nausea and vomiting NG tube ultimately had to be placed to low intermittent suction.  Family notes general surgery was consulted and recommended MRCP. MRCP was obtained on hospital day 2, but did not show any clear signs of a calculus within the distal common bile duct.   there was persistent dilatation of the common bile duct and intrahepatic ducts which was mildly progressed and  periampullary duodenal diverticulum noted.  Labs from 12/6 revealed WBC 19.5, hemoglobin 16, platelets 192, INR 1.3, sodium 136, potassium 3.4, chloride 96, CO2 21.5, BUN 25, creatinine 0.71, total bilirubin 4.2, direct bilirubin 3, indirect 1.2, AST 225.8, ALT 274, alkaline phosphatase 180, lipase 19.  General surgery recommended GI consultation, but those services were not available at the facility.  [facility] called to transfer the patient to [facility] for need of ERCP and GI consultative services    Assessment/Plan:  Possible sepsis with cholangitis, abdominal pain, N/V: Acute. Patient presented with symptoms of acute onset of abdominal pain with nausea and vomiting. Patient met sepsis criteria with tachycardia, WBC elevated up to 19.5, and lactic acid seen as high as 3.1 at the outside facility. Workup showed significant dilatation of the biliary and pancreatic ducts, but no stones seen on MRCP.  -LFTs are improving, no abdominal pains, clinically improving with iv antibiotics, pend cultures. Cont iv fluids, supportive care,. GI plans no ERCP, recommended conservative treatment    Acute encephalopathy: possible due to infection. Neuro exam is non focal. Improving  Hypertensive urgency: Initial blood pressures noted systolics as high as 209. Resume amlodipine/increased. May need low dose BB. Cont IV labetalol 5 mg prn >sbp180 or >dBP 110   Anxiety.  Ativan IV prn Anxiety   Chronic back pain with chronic opioid use: Patient suffers from low back pain history of bulging disks. Previously taking 30 mg of morphine sulfate twice daily along with 20 mg of oxycodone prn q4hr prn pain. Cont monitor on opioids. Stable   History of DVT: Patient  off Xarelto since 10/21/2017. Family reports single episode of DVT in the past. Will recheck legs. Recommended to discuss with her PCP to discontinue  xarelto if no recurrent DVT or risks    History of breast cancer: Patient status post mastectomy and reportedly did  not require chemoradiation.   Code Status: DNR Family Communication: d/w patient, her family. RN (indicate person spoken with, relationship, and if by phone, the number) Disposition Plan:  24-48 hrs. ILF   Consultants:  GI  Procedures:  none  Antibiotics: Anti-infectives (From admission, onward)   Start     Dose/Rate Route Frequency Ordered Stop   10/23/17 0400  piperacillin-tazobactam (ZOSYN) IVPB 3.375 g     3.375 g 12.5 mL/hr over 240 Minutes Intravenous Every 8 hours 10/22/17 2102     10/22/17 2200  metroNIDAZOLE (FLAGYL) IVPB 500 mg  Status:  Discontinued     500 mg 100 mL/hr over 60 Minutes Intravenous Every 8 hours 10/22/17 2014 10/23/17 1256   10/22/17 2100  piperacillin-tazobactam (ZOSYN) IVPB 3.375 g     3.375 g 100 mL/hr over 30 Minutes Intravenous  Once 10/22/17 2014 10/22/17 2120        (indicate start date, and stop date if known)  HPI/Subjective: Alert. No distress. Reports feeling better   Objective: Vitals:   10/23/17 0715 10/23/17 0755  BP: (!) 203/116 (!) 186/95  Pulse:  72  Resp:  18  Temp:    SpO2:  98%    Intake/Output Summary (Last 24 hours) at 10/23/2017 1243 Last data filed at 10/23/2017 0645 Gross per 24 hour  Intake 1135 ml  Output 810 ml  Net 325 ml   Filed Weights   10/23/17 0700  Weight: 55.9 kg (123 lb 3.8 oz)    Exam:   General:  No distress   Cardiovascular: s1,s2 rrr  Respiratory: CTA BL  Abdomen: soft, nt, nd   Musculoskeletal: no leg edema    Data Reviewed: Basic Metabolic Panel: Recent Labs  Lab 10/22/17 2309 10/23/17 0801  NA 136 137  K 2.8* 3.1*  CL 101 103  CO2 23 24  GLUCOSE 126* 124*  BUN 26* 26*  CREATININE 0.64 0.72  CALCIUM 8.9 8.8*  MG  --  2.4   Liver Function Tests: Recent Labs  Lab 10/22/17 2309 10/23/17 0801  AST 118* 98*  ALT 175* 151*  ALKPHOS 127* 124  BILITOT 2.1* 1.5*  PROT 6.8 6.8  ALBUMIN 3.5 3.7   No results for input(s): LIPASE, AMYLASE in the last 168 hours. No  results for input(s): AMMONIA in the last 168 hours. CBC: Recent Labs  Lab 10/22/17 2309 10/23/17 0801  WBC 18.1* 15.0*  NEUTROABS 16.5* 13.9*  HGB 14.5 14.7  HCT 40.3 42.3  MCV 83.3 83.9  PLT 156 144*   Cardiac Enzymes: No results for input(s): CKTOTAL, CKMB, CKMBINDEX, TROPONINI in the last 168 hours. BNP (last 3 results) No results for input(s): BNP in the last 8760 hours.  ProBNP (last 3 results) No results for input(s): PROBNP in the last 8760 hours.  CBG: No results for input(s): GLUCAP in the last 168 hours.  No results found for this or any previous visit (from the past 240 hour(s)).   Studies: No results found.  Scheduled Meds: Continuous Infusions: . sodium chloride Stopped (10/23/17 0230)  . 0.9 % NaCl with KCl 40 mEq / L 100 mL/hr (10/23/17 0235)  . metronidazole Stopped (10/23/17 0741)  . piperacillin-tazobactam (ZOSYN)  IV 3.375 g (10/23/17 0935)      Principal Problem:   Sepsis (HCC) Active Problems:   History of breast cancer   History of DVT (deep vein thrombosis)   Chronic back pain   Hypertensive urgency   Chronic, continuous use of opioids    Time spent: >35 minutes     Suezette Lafave N  [facility] Pager [phone]. If 7PM-7AM, please contact night-coverage at [email or web address], password [email or web address] 10/23/2017, 12:43 PM  LOS: 1 day             

## 2017-10-23 NOTE — Care Management Note (Signed)
Case Management Note  Patient Details  Name: Betty Carpenter MRN: [id number] Date of Birth: 02/10/1925  Subjective/Objective:                  colitis  Action/Plan: Date: October 23, 2017 Velva Harman, BSN, [place], [place] Chart and notes review for patient progress and needs. Will follow for case management and discharge needs. Next review date: [id number] Expected Discharge Date:                  Expected Discharge Plan:  Assisted Living / Rest Home  In-House Referral:  Clinical Social Work  Discharge planning Services  CM Consult  Post Acute Care Choice:    Choice offered to:     DME Arranged:    DME Agency:     HH Arranged:    HH Agency:     Status of Service:  In process, will continue to follow  If discussed at Long Length of Stay Meetings, dates discussed:    Additional Comments:  Leeroy Cha, RN 10/23/2017, 8:34 AM

## 2017-10-23 NOTE — Progress Notes (Signed)
LE venous duplex prelim: negative for DVT. Betty Carpenter Eunice, RDMS, RVT  

## 2017-10-23 NOTE — Consult Note (Signed)
Referring Provider: Dr. Daleen Bo Primary Care Physician:  Patient, No Pcp Per Primary Gastroenterologist:  Althia Forts  Reason for Consultation:  Abdominal pain; Elevated liver enzymes  HPI: Betty Carpenter is a 83 y.o. female with multiple medical problems on Xarelto for DVT transferred from [facility] due to lack of GI coverage with abdominal pain, nausea, and vomiting with elevated LFTs and concern for possible cholangitis by the outside hospital. She has been having severe upper quadrant abdominal pain that started earlier this week. TB 2.2, ALP 110, AST 398, ALT 220, Lipase 19 at outside hospital. CT showed CBD dilation to 15 mm and pancreatic ductal dilation (to 7 mm that has progressed since 2015) and a new 7 mm cystic lesion in the pancreatic body. MRCP negative for CBD stone. Persistent dilation of CBD and intrahepatic ducts noted. Currently, patient is lying in bed in no acute distress complaining of back pain that she has chronically. Denies abdominal pain. Afebrile. WBC 18.1 yesterday. On empiric Zosyn. No family in room. Nurse at bedside.  Past Medical History:  Diagnosis Date  . Arthritis   . Breast cancer ([place])   . Hypertension     Past Surgical History:  Procedure Laterality Date  . APPENDECTOMY      Prior to Admission medications   Medication Sig Start Date End Date Taking? Authorizing Provider  acetaminophen (TYLENOL) 500 MG tablet Take 1,000 mg by mouth every 6 (six) hours as needed for pain.   Yes [provider]  celecoxib (CELEBREX) 200 MG capsule  10/06/17  Yes [provider]  docusate sodium (COLACE) 100 MG capsule Take by mouth.   Yes [provider]  LORazepam (ATIVAN) 0.5 MG tablet Take 0.5 mg by mouth every 8 (eight) hours as needed.   Yes [provider]  lubiprostone (AMITIZA) 24 MCG capsule Take 24 mcg by mouth 2 (two) times daily.   Yes [provider]  magnesium 30 MG tablet Take 30 mg by mouth 3 (three)  times daily.   Yes [provider]  morphine (MS CONTIN) 30 MG 12 hr tablet Take 1 tablet by mouth 2 (two) times daily. 03/17/17  Yes [provider]  ondansetron (ZOFRAN-ODT) 4 MG disintegrating tablet Take 4 mg by mouth every 6 (six) hours. 07/23/17  Yes [provider]  Oxycodone HCl 20 MG TABS Take 1 tablet by mouth every 6 (six) hours as needed for pain. 08/19/17  Yes [provider]  tiZANidine (ZANAFLEX) 4 MG tablet Take 4 mg by mouth 3 (three) times daily as needed. 10/06/17  Yes [provider]  Vitamin D, Ergocalciferol, (DRISDOL) 50000 units CAPS capsule Take by mouth. 08/21/16  Yes [provider]  XARELTO 20 MG TABS tablet  10/06/17  Yes [provider]  amLODipine (NORVASC) 5 MG tablet Take 5 mg by mouth.    [provider]  aspirin 81 MG tablet Take 81 mg by mouth daily.    [provider]  Calcium Citrate-Vitamin D (CITRACAL + D PO) Take by mouth.    [provider]  fish oil-omega-3 fatty acids 1000 MG capsule Take 2 g by mouth daily.    [provider]  lidocaine (LIDODERM) 5 % Place 1 patch onto the skin daily. Remove & Discard patch within 12 hours or as directed by MD    [provider]  Multiple Vitamins-Minerals (CENTRUM SILVER PO) Take by mouth.    [provider]  oxyCODONE-acetaminophen (PERCOCET) 10-325 MG per tablet Take  1 tablet by mouth every 6 (six) hours as needed.    [provider]    Scheduled Meds: Continuous Infusions: . sodium chloride Stopped (10/23/17 0230)  . 0.9 % NaCl with KCl 40 mEq / L 100 mL/hr (10/23/17 0235)  . metronidazole Stopped (10/23/17 0741)  . piperacillin-tazobactam (ZOSYN)  IV 3.375 g (10/23/17 0935)   PRN Meds:.acetaminophen **OR** acetaminophen, HYDROmorphone (DILAUDID) injection, ipratropium-albuterol, labetalol, LORazepam, ondansetron **OR** ondansetron (ZOFRAN) IV  Allergies as of 10/22/2017 - Review Complete  10/22/2017  Allergen Reaction Noted  . Cymbalta [duloxetine hcl]  01/28/2012    Family History  Problem Relation Age of Onset  . Cancer Maternal Grandfather     Social History   Socioeconomic History  . Marital status: Married    Spouse name: Not on file  . Number of children: Not on file  . Years of education: Not on file  . Highest education level: Not on file  Social Needs  . Financial resource strain: Not on file  . Food insecurity - worry: Not on file  . Food insecurity - inability: Not on file  . Transportation needs - medical: Not on file  . Transportation needs - non-medical: Not on file  Occupational History  . Not on file  Tobacco Use  . Smoking status: Never Smoker  . Smokeless tobacco: Never Used  Substance and Sexual Activity  . Alcohol use: Not on file  . Drug use: Not on file  . Sexual activity: No  Other Topics Concern  . Not on file  Social History Narrative  . Not on file    Review of Systems: Unable to obtain from patient  Physical Exam: Vital signs: Vitals:   10/23/17 0715 10/23/17 0755  BP: (!) 203/116 (!) 186/95  Pulse:  72  Resp:  18  Temp:    SpO2:  98%  T 98.5   General:   Somnolent, elderly, cachetic, frail, no acute distress  Head: normocephalic, atraumatic Eyes: anicteric sclera ENT: oropharynx clear Neck: supple, nontender Lungs:  Clear throughout to auscultation.   No wheezes, crackles, or rhonchi. No acute distress. Heart:  Regular rate and rhythm; no murmurs, clicks, rubs,  or gallops. Abdomen: diffuse tenderness with voluntary guarding, soft, nondistended, +BS   Rectal:  Deferred Ext: no edema  GI:  Lab Results: Recent Labs    10/22/17 2309 10/23/17 0801  WBC 18.1* 15.0*  HGB 14.5 14.7  HCT 40.3 42.3  PLT 156 144*   BMET Recent Labs    10/22/17 2309 10/23/17 0801  NA 136 137  K 2.8* 3.1*  CL 101 103  CO2 23 24  GLUCOSE 126* 124*  BUN 26* 26*  CREATININE 0.64 0.72  CALCIUM 8.9 8.8*   LFT Recent  Labs    10/23/17 0801  PROT 6.8  ALBUMIN 3.7  AST 98*  ALT 151*  ALKPHOS 124  BILITOT 1.5*   PT/INR No results for input(s): LABPROT, INR in the last 72 hours.   Studies/Results: No results found.  Impression/Plan: 83 yo with abdominal pain, N/V and elevated transaminases with dilated biliary tree. Her LFTs are improving and she is hemodyamically stable. Xarelto on hold (history of DVT). Clinically, I do NOT think she has ascending cholangitis and does not need an emergent/urgent ERCP. Would manage conservatively with IV Abx and IVFs. Clear liquid diet ok. If her functional status improves may need an EUS to further evaluate biliary tree but she does not need that during this hospitalization, if at all. Dr.  Magod to f/u from our group this weekend.    LOS: 1 day   [place] C.  10/23/2017, 9:45 AM  Pager [phone]  AFTER 5 pm or on weekends please call [phone]

## 2017-10-24 ENCOUNTER — Encounter (HOSPITAL_COMMUNITY): Payer: Self-pay | Admitting: *Deleted

## 2017-10-24 LAB — COMPREHENSIVE METABOLIC PANEL
ALBUMIN: 4.1 g/dL (ref 3.5–5.0)
ALK PHOS: 131 U/L — AB (ref 38–126)
ALT: 116 U/L — ABNORMAL HIGH (ref 14–54)
ANION GAP: 10 (ref 5–15)
AST: 88 U/L — ABNORMAL HIGH (ref 15–41)
BILIRUBIN TOTAL: 2.3 mg/dL — AB (ref 0.3–1.2)
BUN: 22 mg/dL — ABNORMAL HIGH (ref 6–20)
CALCIUM: 9.1 mg/dL (ref 8.9–10.3)
CO2: 27 mmol/L (ref 22–32)
Chloride: 102 mmol/L (ref 101–111)
Creatinine, Ser: 0.63 mg/dL (ref 0.44–1.00)
GFR calc Af Amer: >60 mL/min (ref >60)
GLUCOSE: 134 mg/dL — AB (ref 65–99)
Potassium: 3.2 mmol/L — ABNORMAL LOW (ref 3.5–5.1)
Sodium: 139 mmol/L (ref 135–145)
TOTAL PROTEIN: 7.1 g/dL (ref 6.5–8.1)

## 2017-10-24 MED ORDER — HYDROMORPHONE HCL 1 MG/ML IJ SOLN
0.5000 mg | INTRAMUSCULAR | Status: DC | PRN
  Administered 2017-10-24 – 2017-10-27 (×4): 0.5 mg via INTRAVENOUS
  Filled 2017-10-24 (×4): qty 0.5

## 2017-10-24 MED ORDER — MORPHINE SULFATE ER 30 MG PO TBCR
30.0000 mg | EXTENDED_RELEASE_TABLET | Freq: Two times a day (BID) | ORAL | Status: DC
  Administered 2017-10-24 – 2017-10-27 (×7): 30 mg via ORAL
  Filled 2017-10-24 (×6): qty 1

## 2017-10-24 MED ORDER — HYDRALAZINE HCL 25 MG PO TABS
25.0000 mg | ORAL_TABLET | Freq: Three times a day (TID) | ORAL | Status: DC
  Administered 2017-10-24 – 2017-10-27 (×10): 25 mg via ORAL
  Filled 2017-10-24 (×10): qty 1

## 2017-10-24 MED ORDER — POTASSIUM CHLORIDE IN NACL 40-0.9 MEQ/L-% IV SOLN
INTRAVENOUS | Status: AC
  Administered 2017-10-25: 75 mL/h via INTRAVENOUS
  Filled 2017-10-24 (×2): qty 1000

## 2017-10-24 NOTE — Progress Notes (Signed)
[facility] PROGRESS NOTE  Betty Carpenter QQV:[id number] DOB: 1925/07/20 DOA: 10/22/2017 PCP: Patient, No Pcp Per  Brief summary   83 y.o. female with medical history significant of HTN, chronic back pain, chronic opioid use, DVT on anticoagulation of Xarelto, and breast cancer s/p mastectomy; who initially presented to [facility] on Tuesday October 20, 2017 with complaints of severe abdominal pain with nausea and vomiting.  Patient vomited several times prior to arrival and it was noted to be nonbloody in appearance.    After initial evaluation patient was noted to have a lab work on 12/4 showed lactic acid of 2.7 with a repeat trending upward to 3.1.White blood cell count count was initially noted to be normal at 6. CMP with GFR was noted to be relatively within normal limits, except anion gap 19, total bilirubin 2.2, AST 398.7, ALT 220, alkaline phosphatase 110, albumin 4.9.  Previous records noted that these levels have been previously normal. Urinalysis showed signs of trace protein, small amount of blood, urobilinogen high at 4, and trace leukocyte esterase.   CT scan of the abdomen showed diffuse colonic wall thickening and pericolonic fat stranding compatible with mild colitis and severe intrahepatic biliary duct dilatation with common bile duct dilatation of 15 mm and main pancreatic duct dilatation to 7 mm progressed from 2015, and a new 7 mm cystic lesion in the pancreatic body.  Blood and urine cultures were obtained and patient was started on empiric antibiotics of Zosyn.   Due to persistent nausea and vomiting NG tube ultimately had to be placed to low intermittent suction.  Family notes general surgery was consulted and recommended MRCP. MRCP was obtained on hospital day 2, but did not show any clear signs of a calculus within the distal common bile duct.   there was persistent dilatation of the common bile duct and intrahepatic ducts which was mildly progressed and  periampullary duodenal diverticulum noted.  Labs from 12/6 revealed WBC 19.5, hemoglobin 16, platelets 192, INR 1.3, sodium 136, potassium 3.4, chloride 96, CO2 21.5, BUN 25, creatinine 0.71, total bilirubin 4.2, direct bilirubin 3, indirect 1.2, AST 225.8, ALT 274, alkaline phosphatase 180, lipase 19.  General surgery recommended GI consultation, but those services were not available at the facility.  [facility] called to transfer the patient to [facility] for need of ERCP and GI consultative services    Assessment/Plan:  Possible sepsis with cholangitis, abdominal pain on admission, N/V: Acute. Patient presented with symptoms of acute onset of abdominal pain with nausea and vomiting. Patient met sepsis criteria with tachycardia, WBC elevated up to 19.5, and lactic acid seen as high as 3.1 at the outside facility. Workup showed significant dilatation of the biliary and pancreatic ducts, but no stones seen on MRCP.  -LFTs are improving, no further abdominal pains, clinically improving with iv antibiotics, pend cultures. Cont iv fluids, supportive care. GI plans no ERCP, recommended conservative treatment at this time. F/u LFTs, bli tomorrow    Acute encephalopathy: possible due to infection. Neuro exam is non focal. Improved  Hypertensive urgency: Initial blood pressures noted systolics as high as 332. Resumed amlodipine/increased. Added hydrazine. Cont IV labetalol 5 mg prn >sbp180 or >dBP 110   Anxiety.  Ativan IV prn Anxiety   Chronic back pain with chronic opioid use: Patient suffers from low back pain history of bulging disks. Previously taking 30 mg of morphine sulfate twice daily along with 20 mg of oxycodone prn q4hr prn pain. Resumed home MS contin.  Cont monitor on opioids. Stable   History of DVT: Patient off Xarelto since 10/21/2017. Family reports single episode of DVT in the past. Will recheck legs. Recommended to discuss with her PCP to discontinue  xarelto if no recurrent DVT or risks     History of breast cancer: Patient status post mastectomy and reportedly did not require chemoradiation.   Code Status: DNR Family Communication: d/w patient, her family. RN (indicate person spoken with, relationship, and if by phone, the number) Disposition Plan:  24-48 hrs. ILF   Consultants:  GI  Procedures:  none  Antibiotics: Anti-infectives (From admission, onward)   Start     Dose/Rate Route Frequency Ordered Stop   10/23/17 0400  piperacillin-tazobactam (ZOSYN) IVPB 3.375 g     3.375 g 12.5 mL/hr over 240 Minutes Intravenous Every 8 hours 10/22/17 2102     10/22/17 2200  metroNIDAZOLE (FLAGYL) IVPB 500 mg  Status:  Discontinued     500 mg 100 mL/hr over 60 Minutes Intravenous Every 8 hours 10/22/17 2014 10/23/17 1256   10/22/17 2100  piperacillin-tazobactam (ZOSYN) IVPB 3.375 g     3.375 g 100 mL/hr over 30 Minutes Intravenous  Once 10/22/17 2014 10/22/17 2120       (indicate start date, and stop date if known)  HPI/Subjective: Alert. No distress. Reports feeling better   Objective: Vitals:   10/24/17 0749 10/24/17 0929  BP: (!) 187/114 (!) 166/97  Pulse: 80 77  Resp: 20 20  Temp:    SpO2: 99% 100%    Intake/Output Summary (Last 24 hours) at 10/24/2017 1128 Last data filed at 10/24/2017 0920 Gross per 24 hour  Intake 1763.75 ml  Output -  Net 1763.75 ml   Filed Weights   10/23/17 0700  Weight: 55.9 kg (123 lb 3.8 oz)    Exam:   General:  No distress   Cardiovascular: s1,s2 rrr  Respiratory: CTA BL  Abdomen: soft, nt, nd   Musculoskeletal: no leg edema    Data Reviewed: Basic Metabolic Panel: Recent Labs  Lab 10/22/17 2309 10/23/17 0801 10/24/17 0624  NA 136 137 139  K 2.8* 3.1* 3.2*  CL 101 103 102  CO2 _0 GLUCOSE 126* 124* 134*  BUN 26* 26* 22*  CREATININE 0.64 0.72 0.63  CALCIUM 8.9 8.8* 9.1  MG  --  2.4  --    Liver Function Tests: Recent Labs  Lab 10/22/17 2309 10/23/17 0801 10/24/17 0624  AST 118* 98*  88*  ALT 175* 151* 116*  ALKPHOS 127* 124 131*  BILITOT 2.1* 1.5* 2.3*  PROT 6.8 6.8 7.1  ALBUMIN 3.5 3.7 4.1   No results for input(s): LIPASE, AMYLASE in the last 168 hours. No results for input(s): AMMONIA in the last 168 hours. CBC: Recent Labs  Lab 10/22/17 2309 10/23/17 0801  WBC 18.1* 15.0*  NEUTROABS 16.5* 13.9*  HGB 14.5 14.7  HCT 40.3 42.3  MCV 83.3 83.9  PLT 156 144*   Cardiac Enzymes: No results for input(s): CKTOTAL, CKMB, CKMBINDEX, TROPONINI in the last 168 hours. BNP (last 3 results) No results for input(s): BNP in the last 8760 hours.  ProBNP (last 3 results) No results for input(s): PROBNP in the last 8760 hours.  CBG: No results for input(s): GLUCAP in the last 168 hours.  Recent Results (from the past 240 hour(s))  MRSA PCR Screening     Status: None   Collection Time: 10/23/17  9:39 AM  Result Value Ref Range Status  MRSA by PCR NEGATIVE NEGATIVE Final    Comment:        The GeneXpert MRSA Assay (FDA approved for NASAL specimens only), is one component of a comprehensive MRSA colonization surveillance program. It is not intended to diagnose MRSA infection nor to guide or monitor treatment for MRSA infections.      Studies: No results found.  Scheduled Meds: . amLODipine  10 mg Oral Daily  . morphine  30 mg Oral Q12H   Continuous Infusions: . 0.9 % NaCl with KCl 40 mEq / L 75 mL/hr (10/24/17 0445)  . piperacillin-tazobactam (ZOSYN)  IV Stopped (10/24/17 0900)    Principal Problem:   Sepsis ([place]) Active Problems:   History of breast cancer   History of DVT (deep vein thrombosis)   Chronic back pain   Hypertensive urgency   Chronic, continuous use of opioids    Time spent: >35 minutes     Kinnie Feil  [facility] Pager [phone]. If 7PM-7AM, please contact night-coverage at [email or web address], password [email or web address] 10/24/2017, 11:28 AM  LOS: 2 days

## 2017-10-24 NOTE — Plan of Care (Signed)
  Pain Managment: General experience of comfort will improve 10/24/2017 0806 - Progressing by Dorene Sorrow, RN   Elimination: Will not experience complications related to bowel motility 10/24/2017 0806 - Progressing by Dorene Sorrow, RN   Nutrition: Adequate nutrition will be maintained 10/24/2017 0806 - Progressing by Dorene Sorrow, RN

## 2017-10-24 NOTE — Progress Notes (Signed)
Betty Carpenter 10:07 AM  Subjective: Patient seen and examined and discussed with my partner and hospital computer chart reviewed and her main complaint is her back and she is tolerating clear liquids and she denies abdominal complaints currently  Objective: Vital signs stable afebrile no acute distress abdomen is soft nontender no new white count LFTs about the same  Assessment: Elevated LFTs questionable etiology  Plan: Will continue to follow okay to slowly advance diet probably EUS at some point  [facility] E  Pager [phone] After 5PM or if no answer call [phone]

## 2017-10-25 DIAGNOSIS — A419 Sepsis, unspecified organism: Principal | ICD-10-CM

## 2017-10-25 LAB — COMPREHENSIVE METABOLIC PANEL
ALK PHOS: 90 U/L (ref 38–126)
ALT: 77 U/L — AB (ref 14–54)
AST: 58 U/L — AB (ref 15–41)
Albumin: 3.2 g/dL — ABNORMAL LOW (ref 3.5–5.0)
Anion gap: 6 (ref 5–15)
BILIRUBIN TOTAL: 1.6 mg/dL — AB (ref 0.3–1.2)
BUN: 21 mg/dL — AB (ref 6–20)
CALCIUM: 8.5 mg/dL — AB (ref 8.9–10.3)
CO2: 26 mmol/L (ref 22–32)
CREATININE: 0.69 mg/dL (ref 0.44–1.00)
Chloride: 107 mmol/L (ref 101–111)
GFR calc Af Amer: >60 mL/min (ref >60)
Glucose, Bld: 95 mg/dL (ref 65–99)
POTASSIUM: 3.9 mmol/L (ref 3.5–5.1)
Sodium: 139 mmol/L (ref 135–145)
TOTAL PROTEIN: 5.6 g/dL — AB (ref 6.5–8.1)

## 2017-10-25 LAB — CBC
HCT: 38.3 % (ref 36.0–46.0)
Hemoglobin: 12.6 g/dL (ref 12.0–15.0)
MCH: 28.6 pg (ref 26.0–34.0)
MCHC: 32.9 g/dL (ref 30.0–36.0)
MCV: 87 fL (ref 78.0–100.0)
PLATELETS: 126 10*3/uL — AB (ref 150–400)
RBC: 4.4 MIL/uL (ref 3.87–5.11)
RDW: 14 % (ref 11.5–15.5)
WBC: 6.9 10*3/uL (ref 4.0–10.5)

## 2017-10-25 NOTE — Progress Notes (Signed)
Pharmacy Antibiotic Note  Betty Carpenter is a 83 y.o. female admitted on 10/22/2017 with intra-abdominal infection.  Pharmacy consulted for zosyn dosing.  Plan: Zosyn 3.375mg  IV q8h EI  Height: 5\' 10"  (177.8 cm) Weight: 123 lb 3.8 oz (55.9 kg) IBW/kg (Calculated) : 68.5  Temp (24hrs), Avg:98.6 F (37 C), Min:98.1 F (36.7 C), Max:99.1 F (37.3 C)  Recent Labs  Lab 10/22/17 2019 10/22/17 2309 10/23/17 0801 10/24/17 0624 10/25/17 0443  WBC  --  18.1* 15.0*  --  6.9  CREATININE  --  0.64 0.72 0.63 0.69  LATICACIDVEN 1.4 0.9  --   --   --     Estimated Creatinine Clearance: 41.2 mL/min (by C-G formula based on SCr of 0.69 mg/dL).    Allergies  Allergen Reactions  . Cymbalta [Duloxetine Hcl]     " couldn't make water"- unable to urinate   Antimicrobials this admission: 12/6 zosyn >> 12/6 flagyl >> 12/7  Microbiology results: 12/6 BCx: ngtd 12/7 MRSA PCR: neg  Thank you for allowing pharmacy to be a part of this patient's care.  Minda Ditto PharmD Pager [phone] 10/25/2017, 8:23 AM

## 2017-10-25 NOTE — Progress Notes (Signed)
[facility]  PROGRESS NOTE  Betty Carpenter QAS:[id number] DOB: 09/29/1925 DOA: 10/22/2017   PCP: Patient, No Pcp Per  Brief summary  83 y.o. female with medical history significant of HTN, chronic back pain, chronic opioid use, DVT on anticoagulation of Xarelto, and breast cancer s/p mastectomy; who initially presented to [facility] on Tuesday October 20, 2017 with complaints of severe abdominal pain with nausea and vomiting.  Patient vomited several times prior to arrival and it was noted to be nonbloody in appearance.    After initial evaluation patient was noted to have a lab work on 12/4 showed lactic acid of 2.7 with a repeat trending upward to 3.1.White blood cell count count was initially noted to be normal at 6. CMP with GFR was noted to be relatively within normal limits, except anion gap 19, total bilirubin 2.2, AST 398.7, ALT 220, alkaline phosphatase 110, albumin 4.9.  Previous records noted that these levels have been previously normal. Urinalysis showed signs of trace protein, small amount of blood, urobilinogen high at 4, and trace leukocyte esterase.   CT scan of the abdomen showed diffuse colonic wall thickening and pericolonic fat stranding compatible with mild colitis and severe intrahepatic biliary duct dilatation with common bile duct dilatation of 15 mm and main pancreatic duct dilatation to 7 mm progressed from 2015, and a new 7 mm cystic lesion in the pancreatic body.  Blood and urine cultures were obtained and patient was started on empiric antibiotics of Zosyn.   Due to persistent nausea and vomiting NG tube ultimately had to be placed to low intermittent suction.  Family notes general surgery was consulted and recommended MRCP. MRCP was obtained on hospital day 2, but did not show any clear signs of a calculus within the distal common bile duct.   there was persistent dilatation of the common bile duct and intrahepatic ducts which was mildly progressed and  periampullary duodenal diverticulum noted.  Labs from 12/6 revealed WBC 19.5, hemoglobin 16, platelets 192, INR 1.3, sodium 136, potassium 3.4, chloride 96, CO2 21.5, BUN 25, creatinine 0.71, total bilirubin 4.2, direct bilirubin 3, indirect 1.2, AST 225.8, ALT 274, alkaline phosphatase 180, lipase 19.  General surgery recommended GI consultation, but those services were not available at the facility.  [facility] called to transfer the [facility] to [facility] for need of ERCP and GI consultative services.  Assessment/Plan:  Possible sepsis with cholangitis, abdominal pain on admission, N/V: Acute. - initial work up with significant dilatation of the biliary and pancreatic ducts, no stones on MRCP - pt denies abd this AM and wants to try to eat solids, no N/V reported - GI team has been consulted, recommended slow diet advancement - pt was advised if solids not tolerated will have to go back to clears - continue analgesia as needed  - continue ABX - LFT's trending down, WBC is WNL - repeat CMET in AM  Acute encephalopathy: possible due to infection - resolved   Hypertensive urgency: Initial blood pressures noted systolics as high as 798 - no reasonably stable   Anxiety - ok to use ativan prn   Chronic back pain with chronic opioid use:  - Patient suffers from low back pain history of bulging disks. - continue home medical regimen   Hypokalemia - supplemented and WNL  History of DVT: - Patient off Xarelto since 10/21/2017. Family reports single episode of DVT in the past. Will recheck legs.  - Recommended to discuss with her PCP to  discontinue  xarelto if no recurrent DVT or risks    History of breast cancer: Patient status post mastectomy and reportedly did not require chemoradiation.  Code Status: DNR Family Communication: pt updated at bedside  Disposition Plan:  In 23-48 hours depending on clinical progress     Consultants:  GI  Procedures:  none  Antibiotics: Anti-infectives (From admission, onward)   Start     Dose/Rate Route Frequency Ordered Stop   10/23/17 0400  piperacillin-tazobactam (ZOSYN) IVPB 3.375 g     3.375 g 12.5 mL/hr over 240 Minutes Intravenous Every 8 hours 10/22/17 2102     10/22/17 2200  metroNIDAZOLE (FLAGYL) IVPB 500 mg  Status:  Discontinued     500 mg 100 mL/hr over 60 Minutes Intravenous Every 8 hours 10/22/17 2014 10/23/17 1256   10/22/17 2100  piperacillin-tazobactam (ZOSYN) IVPB 3.375 g     3.375 g 100 mL/hr over 30 Minutes Intravenous  Once 10/22/17 2014 10/22/17 2120      HPI/Subjective: Pt reports feeling better. Wants to eat.   Objective: Vitals:   10/24/17 2049 10/25/17 0619  BP: 136/70 138/89  Pulse: (!) 125 74  Resp: 20 20  Temp: 99.1 F (37.3 C) 98.1 F (36.7 C)  SpO2: 100% 99%    Intake/Output Summary (Last 24 hours) at 10/25/2017 1051 Last data filed at 10/25/2017 0510 Gross per 24 hour  Intake 2361.25 ml  Output 600 ml  Net 1761.25 ml   Filed Weights   10/23/17 0700  Weight: 55.9 kg (123 lb 3.8 oz)   Physical Exam  Constitutional: Appears calm, NAD CVS: RRR, S1/S2 +, no murmurs, no gallops, no carotid bruit.  Pulmonary: Effort and breath sounds normal, no stridor, rhonchi, wheezes, rales.  Abdominal: Soft. BS +,  no distension, tenderness, rebound or guarding.   Data Reviewed: Basic Metabolic Panel: Recent Labs  Lab 10/22/17 2309 10/23/17 0801 10/24/17 0624 10/25/17 0443  NA 136 137 139 139  K 2.8* 3.1* 3.2* 3.9  CL 101 103 102 107  CO2 23 24 27 26   GLUCOSE 126* 124* 134* 95  BUN 26* 26* 22* 21*  CREATININE 0.64 0.72 0.63 0.69  CALCIUM 8.9 8.8* 9.1 8.5*  MG  --  2.4  --   --    Liver Function Tests: Recent Labs  Lab 10/22/17 2309 10/23/17 0801 10/24/17 0624 10/25/17 0443  AST 118* 98* 88* 58*  ALT 175* 151* 116* 77*  ALKPHOS 127* 124 131* 90  BILITOT 2.1* 1.5* 2.3* 1.6*  PROT 6.8 6.8 7.1 5.6*   ALBUMIN 3.5 3.7 4.1 3.2*   CBC: Recent Labs  Lab 10/22/17 2309 10/23/17 0801 10/25/17 0443  WBC 18.1* 15.0* 6.9  NEUTROABS 16.5* 13.9*  --   HGB 14.5 14.7 12.6  HCT 40.3 42.3 38.3  MCV 83.3 83.9 87.0  PLT 156 144* 126*    Recent Results (from the past 240 hour(s))  Culture, blood (x 2)     Status: None (Preliminary result)   Collection Time: 10/22/17  8:18 PM  Result Value Ref Range Status   Specimen Description BLOOD LEFT HAND  Final   Special Requests IN PEDIATRIC BOTTLE Blood Culture adequate volume  Final   Culture   Final    NO GROWTH 1 DAY Performed at [facility], [address] [place], [place] [address]    Report Status PENDING  Incomplete  Culture, blood (x 2)     Status: None (Preliminary result)   Collection Time: 10/22/17  8:19  PM  Result Value Ref Range Status   Specimen Description BLOOD LEFT ARM  Final   Special Requests IN PEDIATRIC BOTTLE Blood Culture adequate volume  Final   Culture   Final    NO GROWTH 1 DAY Performed at [facility], [address], [place], [place] [address]    Report Status PENDING  Incomplete  MRSA PCR Screening     Status: None   Collection Time: 10/23/17  9:39 AM  Result Value Ref Range Status   MRSA by PCR NEGATIVE NEGATIVE Final    Comment:        The GeneXpert MRSA Assay (FDA approved for NASAL specimens only), is one component of a comprehensive MRSA colonization surveillance program. It is not intended to diagnose MRSA infection nor to guide or monitor treatment for MRSA infections.      Studies: No results found.  Scheduled Meds: . amLODipine  10 mg Oral Daily  . hydrALAZINE  25 mg Oral Q8H  . morphine  30 mg Oral Q12H   Continuous Infusions: . 0.9 % NaCl with KCl 40 mEq / L 75 mL/hr (10/25/17 0956)  . piperacillin-tazobactam (ZOSYN)  IV Stopped (10/25/17 6568)    Time spent: 25 minutes   [facility] Pager [phone]. If 7PM-7AM, please contact  night-coverage at [email or web address], password [facility] 10/25/2017, 10:51 AM  LOS: 3 days

## 2017-10-25 NOTE — Progress Notes (Signed)
Betty Carpenter 10:56 AM  Subjective: Patient doing better from a GI standpoint and wants to eat and still complains of back pain which is unchanged and has no new complaints  Objective: Vital signs stable afebrile no acute distress abdomen is sore occasional bowel sounds no guarding or rebound liver tests improved white count normal  Assessment: Improved probable passed CBD stone  Plan: Perfect world patient would have an EUS at some point and my partner Dr. Paulita Fujita as happy to see as an outpatient to determine whether he is willing to do this or not otherwise call him Monday if he thought and needed to be done as an inpatient and please call us back if we can be of any further assistance with this hospital stay  [facility]  Pager [phone] After 5PM or if no answer call [phone]

## 2017-10-26 LAB — CBC
HEMATOCRIT: 34.8 % — AB (ref 36.0–46.0)
HEMOGLOBIN: 11.4 g/dL — AB (ref 12.0–15.0)
MCH: 28.4 pg (ref 26.0–34.0)
MCHC: 32.8 g/dL (ref 30.0–36.0)
MCV: 86.8 fL (ref 78.0–100.0)
Platelets: 136 10*3/uL — ABNORMAL LOW (ref 150–400)
RBC: 4.01 MIL/uL (ref 3.87–5.11)
RDW: 14.1 % (ref 11.5–15.5)
WBC: 7 10*3/uL (ref 4.0–10.5)

## 2017-10-26 LAB — COMPREHENSIVE METABOLIC PANEL
ALBUMIN: 3 g/dL — AB (ref 3.5–5.0)
ALT: 76 U/L — ABNORMAL HIGH (ref 14–54)
ANION GAP: 5 (ref 5–15)
AST: 62 U/L — AB (ref 15–41)
Alkaline Phosphatase: 83 U/L (ref 38–126)
BUN: 20 mg/dL (ref 6–20)
CHLORIDE: 105 mmol/L (ref 101–111)
CO2: 26 mmol/L (ref 22–32)
Calcium: 8.4 mg/dL — ABNORMAL LOW (ref 8.9–10.3)
Creatinine, Ser: 0.73 mg/dL (ref 0.44–1.00)
GFR calc Af Amer: >60 mL/min (ref >60)
GFR calc non Af Amer: >60 mL/min (ref >60)
GLUCOSE: 99 mg/dL (ref 65–99)
POTASSIUM: 3.9 mmol/L (ref 3.5–5.1)
SODIUM: 136 mmol/L (ref 135–145)
Total Bilirubin: 1 mg/dL (ref 0.3–1.2)
Total Protein: 5.4 g/dL — ABNORMAL LOW (ref 6.5–8.1)

## 2017-10-26 NOTE — Progress Notes (Signed)
[facility]  PROGRESS NOTE  Betty Carpenter DTO:[id number] DOB: 20-Nov-1924 DOA: 10/22/2017   PCP: Patient, No Pcp Per  Brief summary  83 y.o. female with medical history significant of HTN, chronic back pain, chronic opioid use, DVT on anticoagulation of Xarelto, and breast cancer s/p mastectomy; who initially presented to [facility] on Tuesday October 20, 2017 with complaints of severe abdominal pain with nausea and vomiting.  Patient vomited several times prior to arrival and it was noted to be nonbloody in appearance.    After initial evaluation patient was noted to have a lab work on 12/4 showed lactic acid of 2.7 with a repeat trending upward to 3.1.White blood cell count count was initially noted to be normal at 6. CMP with GFR was noted to be relatively within normal limits, except anion gap 19, total bilirubin 2.2, AST 398.7, ALT 220, alkaline phosphatase 110, albumin 4.9.  Previous records noted that these levels have been previously normal. Urinalysis showed signs of trace protein, small amount of blood, urobilinogen high at 4, and trace leukocyte esterase.   CT scan of the abdomen showed diffuse colonic wall thickening and pericolonic fat stranding compatible with mild colitis and severe intrahepatic biliary duct dilatation with common bile duct dilatation of 15 mm and main pancreatic duct dilatation to 7 mm progressed from 2015, and a new 7 mm cystic lesion in the pancreatic body.  Blood and urine cultures were obtained and patient was started on empiric antibiotics of Zosyn.   Due to persistent nausea and vomiting NG tube ultimately had to be placed to low intermittent suction.  Family notes general surgery was consulted and recommended MRCP. MRCP was obtained on hospital day 2, but did not show any clear signs of a calculus within the distal common bile duct.   there was persistent dilatation of the common bile duct and intrahepatic ducts which was mildly progressed and  periampullary duodenal diverticulum noted.  Labs from 12/6 revealed WBC 19.5, hemoglobin 16, platelets 192, INR 1.3, sodium 136, potassium 3.4, chloride 96, CO2 21.5, BUN 25, creatinine 0.71, total bilirubin 4.2, direct bilirubin 3, indirect 1.2, AST 225.8, ALT 274, alkaline phosphatase 180, lipase 19.  General surgery recommended GI consultation, but those services were not available at the facility.  [facility] called to transfer the patient [facility] for need of ERCP and GI consultative services.  Assessment/Plan:  Possible sepsis with cholangitis, abdominal pain on admission, N/V: Acute. - initial work up with significant dilatation of the biliary and pancreatic ducts, no stones on MRCP - diet has been advanced and pt has tolerated well - GI team has been consulted, recommended slow diet advancement - continue analgesia as needed  - continue ABX and plan to narrow down in AM - LFT's trending down but still elevated, WBC has remained WNL - repeat CMET in AM  Acute encephalopathy: possible due to infection - resolved   Hypertensive urgency: Initial blood pressures noted systolics as high as 983 - overall reasonably stable   Anxiety - ok to use ativan prn  - stable   Chronic back pain with chronic opioid use:  - Patient suffers from low back pain history of bulging disks. - continue home medical regimen   Hypokalemia - supplemented and WNL  History of DVT: - Patient off Xarelto since 10/21/2017. Family reports single episode of DVT in the past. Will recheck legs.  - Recommended to discuss with her PCP to discontinue  xarelto if no recurrent DVT  or risks   History of breast cancer: Patient status post mastectomy and reportedly did not require chemoradiation.  Code Status: DNR Family Communication: pt updated at bedside  Disposition Plan:  In 24 hours if stable    Consultants:  GI  Procedures:  none  Antibiotics: Anti-infectives (From admission, onward)   Start      Dose/Rate Route Frequency Ordered Stop   10/23/17 0400  piperacillin-tazobactam (ZOSYN) IVPB 3.375 g     3.375 g 12.5 mL/hr over 240 Minutes Intravenous Every 8 hours 10/22/17 2102     10/22/17 2200  metroNIDAZOLE (FLAGYL) IVPB 500 mg  Status:  Discontinued     500 mg 100 mL/hr over 60 Minutes Intravenous Every 8 hours 10/22/17 2014 10/23/17 1256   10/22/17 2100  piperacillin-tazobactam (ZOSYN) IVPB 3.375 g     3.375 g 100 mL/hr over 30 Minutes Intravenous  Once 10/22/17 2014 10/22/17 2120      HPI/Subjective: No events overnight.   Objective: Vitals:   10/25/17 2254 10/26/17 0529  BP: 137/62 128/66  Pulse: 71 81  Resp: 16 17  Temp: 98.5 F (36.9 C) 98.6 F (37 C)  SpO2: 99% 99%    Intake/Output Summary (Last 24 hours) at 10/26/2017 0835 Last data filed at 10/26/2017 0749 Gross per 24 hour  Intake 410 ml  Output -  Net 410 ml   Filed Weights   10/23/17 0700  Weight: 55.9 kg (123 lb 3.8 oz)   Physical Exam  Constitutional: Appears calm, NAD CVS: RRR, S1/S2 +, no murmurs, no gallops, no carotid bruit.  Pulmonary: Effort and breath sounds normal, no stridor, rhonchi, wheezes, rales.  Abdominal: Soft. BS +,  no distension, tenderness, rebound or guarding.  Musculoskeletal: Normal range of motion. No edema and no tenderness.   Data Reviewed: Basic Metabolic Panel: Recent Labs  Lab 10/22/17 2309 10/23/17 0801 10/24/17 0624 10/25/17 0443 10/26/17 0521  NA 136 137 139 139 136  K 2.8* 3.1* 3.2* 3.9 3.9  CL 101 103 102 107 105  CO2 23 24 27 26 26   GLUCOSE 126* 124* 134* 95 99  BUN 26* 26* 22* 21* 20  CREATININE 0.64 0.72 0.63 0.69 0.73  CALCIUM 8.9 8.8* 9.1 8.5* 8.4*  MG  --  2.4  --   --   --    Liver Function Tests: Recent Labs  Lab 10/22/17 2309 10/23/17 0801 10/24/17 0624 10/25/17 0443 10/26/17 0521  AST 118* 98* 88* 58* 62*  ALT 175* 151* 116* 77* 76*  ALKPHOS 127* 124 131* 90 83  BILITOT 2.1* 1.5* 2.3* 1.6* 1.0  PROT 6.8 6.8 7.1 5.6* 5.4*   ALBUMIN 3.5 3.7 4.1 3.2* 3.0*   CBC: Recent Labs  Lab 10/22/17 2309 10/23/17 0801 10/25/17 0443 10/26/17 0521  WBC 18.1* 15.0* 6.9 7.0  NEUTROABS 16.5* 13.9*  --   --   HGB 14.5 14.7 12.6 11.4*  HCT 40.3 42.3 38.3 34.8*  MCV 83.3 83.9 87.0 86.8  PLT 156 144* 126* 136*    Recent Results (from the past 240 hour(s))  Culture, blood (x 2)     Status: None (Preliminary result)   Collection Time: 10/22/17  8:18 PM  Result Value Ref Range Status   Specimen Description BLOOD LEFT HAND  Final   Special Requests IN PEDIATRIC BOTTLE Blood Culture adequate volume  Final   Culture   Final    NO GROWTH 2 DAYS Performed at [facility], [address] [place], [place] [address]  Report Status PENDING  Incomplete  Culture, blood (x 2)     Status: None (Preliminary result)   Collection Time: 10/22/17  8:19 PM  Result Value Ref Range Status   Specimen Description BLOOD LEFT ARM  Final   Special Requests IN PEDIATRIC BOTTLE Blood Culture adequate volume  Final   Culture   Final    NO GROWTH 2 DAYS Performed at [facility], [address] [place], [place] [address]    Report Status PENDING  Incomplete  MRSA PCR Screening     Status: None   Collection Time: 10/23/17  9:39 AM  Result Value Ref Range Status   MRSA by PCR NEGATIVE NEGATIVE Final    Comment:        The GeneXpert MRSA Assay (FDA approved for NASAL specimens only), is one component of a comprehensive MRSA colonization surveillance program. It is not intended to diagnose MRSA infection nor to guide or monitor treatment for MRSA infections.      Studies: No results found.  Scheduled Meds: . amLODipine  10 mg Oral Daily  . hydrALAZINE  25 mg Oral Q8H  . morphine  30 mg Oral Q12H   Continuous Infusions: . piperacillin-tazobactam (ZOSYN)  IV 3.375 g (10/26/17 0454)   Time spent: 25 minutes   [facility] Pager [phone]. If 7PM-7AM, please contact night-coverage at  [email or web address], password [facility] 10/26/2017, 8:35 AM  LOS: 4 days

## 2017-10-26 NOTE — Evaluation (Signed)
Physical Therapy Evaluation Patient Details Name: Betty Carpenter MRN: [id number] DOB: 1925-05-15 Today's Date: 10/26/2017   History of Present Illness  83 y.o. female with medical history significant of HTN, chronic back pain, chronic opioid use, DVT on anticoagulation of Xarelto, and breast cancer s/p mastectomy; presents with complaints of severe abdominal pain with nausea and vomiting and admitted for Possible sepsis with cholangitis  Clinical Impression  Pt admitted with above diagnosis. Pt currently with functional limitations due to the deficits listed below (see PT Problem List).  Pt will benefit from skilled PT to increase their independence and safety with mobility to allow discharge to the venue listed below.   Pt assisted with ambulating short distance in hallway and requiring steadying assist.  Pt from ALF per chart, however pt reports being from home with spouse.  Nursing staff report pt confusion today, uncertain of baseline.     Follow Up Recommendations SNF;Supervision/Assistance - 24 hour    Equipment Recommendations  Rolling walker with 5" wheels    Recommendations for Other Services       Precautions / Restrictions Precautions Precautions: Fall      Mobility  Bed Mobility Overal bed mobility: Needs Assistance Bed Mobility: Supine to Sit     Supine to sit: Supervision;HOB elevated        Transfers Overall transfer level: Needs assistance Equipment used: 1 person hand held assist Transfers: Sit to/from Stand Sit to Stand: Min assist         General transfer comment: assist to rise and steady  Ambulation/Gait Ambulation/Gait assistance: Min assist Ambulation Distance (Feet): 90 Feet Assistive device: 1 person hand held assist Gait Pattern/deviations: Step-through pattern;Decreased stride length;Narrow base of support Gait velocity: decr   General Gait Details: pt pushed IV pole and provided with HHA, typically uses walker per pt, requiring steady  assist, distance to tolerance  Stairs            Wheelchair Mobility    Modified Rankin (Stroke Patients Only)       Balance Overall balance assessment: Needs assistance         Standing balance support: Bilateral upper extremity supported Standing balance-Leahy Scale: Poor                               Pertinent Vitals/Pain Pain Assessment: No/denies pain    Home Living Family/patient expects to be discharged to:: Assisted living                 Additional Comments: from ALF per chart, pt stating she is home with her husband, confused per staff    Prior Function Level of Independence: Independent with assistive device(s)         Comments: pt reports using walker however decreased cognition, uncertain of accuracy     Hand Dominance        Extremity/Trunk Assessment        Lower Extremity Assessment Lower Extremity Assessment: Generalized weakness       Communication   Communication: No difficulties  Cognition Arousal/Alertness: Awake/alert Behavior During Therapy: WFL for tasks assessed/performed Overall Cognitive Status: No family/caregiver present to determine baseline cognitive functioning                                 General Comments: pt confused per staff, no hx of dementia in chart  General Comments      Exercises     Assessment/Plan    PT Assessment Patient needs continued PT services  PT Problem List Decreased strength;Decreased mobility;Decreased activity tolerance;Decreased balance;Decreased knowledge of use of DME;Decreased cognition       PT Treatment Interventions DME instruction;Therapeutic exercise;Gait training;Therapeutic activities;Patient/family education;Balance training;Functional mobility training    PT Goals (Current goals can be found in the Care Plan section)  Acute Rehab PT Goals PT Goal Formulation: With patient Time For Goal Achievement: 11/02/17 Potential to  Achieve Goals: Good    Frequency Min 3X/week   Barriers to discharge        Co-evaluation               AM-PAC PT "6 Clicks" Daily Activity  Outcome Measure Difficulty turning over in bed (including adjusting bedclothes, sheets and blankets)?: A Little Difficulty moving from lying on back to sitting on the side of the bed? : A Little Difficulty sitting down on and standing up from a chair with arms (e.g., wheelchair, bedside commode, etc,.)?: Unable Help needed moving to and from a bed to chair (including a wheelchair)?: A Little Help needed walking in hospital room?: A Little Help needed climbing 3-5 steps with a railing? : A Lot 6 Click Score: 15    End of Session Equipment Utilized During Treatment: Gait belt Activity Tolerance: Patient tolerated treatment well Patient left: in chair;with chair alarm set;with call bell/phone within reach Nurse Communication: Mobility status PT Visit Diagnosis: Difficulty in walking, not elsewhere classified (R26.2);Unsteadiness on feet (R26.81)    Time: 4098-1191 PT Time Calculation (min) (ACUTE ONLY): 13 min   Charges:   PT Evaluation $PT Eval Low Complexity: 1 Low     PT G Codes:        Carmelia Bake, PT, DPT 10/26/2017 Pager: [phone]  York Ram E 10/26/2017, 1:45 PM

## 2017-10-27 LAB — CBC
HCT: 34.6 % — ABNORMAL LOW (ref 36.0–46.0)
HEMOGLOBIN: 11.3 g/dL — AB (ref 12.0–15.0)
MCH: 28.4 pg (ref 26.0–34.0)
MCHC: 32.7 g/dL (ref 30.0–36.0)
MCV: 86.9 fL (ref 78.0–100.0)
Platelets: 160 10*3/uL (ref 150–400)
RBC: 3.98 MIL/uL (ref 3.87–5.11)
RDW: 13.9 % (ref 11.5–15.5)
WBC: 6.5 10*3/uL (ref 4.0–10.5)

## 2017-10-27 LAB — BASIC METABOLIC PANEL
Anion gap: 7 (ref 5–15)
BUN: 22 mg/dL — AB (ref 6–20)
CHLORIDE: 101 mmol/L (ref 101–111)
CO2: 29 mmol/L (ref 22–32)
Calcium: 8.7 mg/dL — ABNORMAL LOW (ref 8.9–10.3)
Creatinine, Ser: 0.77 mg/dL (ref 0.44–1.00)
GFR calc Af Amer: >60 mL/min (ref >60)
GFR calc non Af Amer: >60 mL/min (ref >60)
GLUCOSE: 103 mg/dL — AB (ref 65–99)
POTASSIUM: 3.9 mmol/L (ref 3.5–5.1)
Sodium: 137 mmol/L (ref 135–145)

## 2017-10-27 MED ORDER — CIPROFLOXACIN HCL 500 MG PO TABS: 500.0000 mg | ORAL_TABLET | Freq: Two times a day (BID) | ORAL | 0 refills | Status: DC

## 2017-10-27 MED ORDER — TIZANIDINE HCL 4 MG PO TABS: 4.0000 mg | ORAL_TABLET | Freq: Three times a day (TID) | ORAL | 0 refills | Status: DC | PRN

## 2017-10-27 MED ORDER — AMLODIPINE BESYLATE 5 MG PO TABS: 5.0000 mg | ORAL_TABLET | Freq: Two times a day (BID) | ORAL | 0 refills | Status: DC

## 2017-10-27 MED ORDER — LORAZEPAM 0.5 MG PO TABS: 0.5000 mg | ORAL_TABLET | Freq: Two times a day (BID) | ORAL | 0 refills | Status: DC | PRN

## 2017-10-27 MED ORDER — MORPHINE SULFATE ER 30 MG PO TBCR: 30.0000 mg | EXTENDED_RELEASE_TABLET | Freq: Two times a day (BID) | ORAL | 0 refills | Status: DC

## 2017-10-27 NOTE — Clinical Social Work Placement (Signed)
   3:35 PM Patient and family chose bed at [facility]. Patient will transport by [facility] LCSW confirmed bed with facility Faxed dc docs via hub RN report # [phone]  [place]  NOTE  Date:  10/27/2017  Patient Details  Name: Betty Carpenter MRN: [id number] Date of Birth: May 14, 1925  Clinical Social Work is seeking post-discharge placement for this patient at the [place] level of care (*CSW will initial, date and re-position this form in  chart as items are completed):  Yes   Patient/family provided with [place] Work Department's list of facilities offering this level of care within the geographic area requested by the patient (or if unable, by the patient's family).  Yes   Patient/family informed of their freedom to choose among providers that offer the needed level of care, that participate in Medicare, Medicaid or managed care program needed by the patient, have an available bed and are willing to accept the patient.  Yes   Patient/family informed of [place]'s ownership interest in [facility] and [facility], as well as of the fact that they are under no obligation to receive care at these facilities.  PASRR submitted to EDS on       PASRR number received on       Existing PASRR number confirmed on       FL2 transmitted to all facilities in geographic area requested by pt/family on 10/27/17     FL2 transmitted to all facilities within larger geographic area on       Patient informed that his/her managed care company has contracts with or will negotiate with certain facilities, including the following:        Yes   Patient/family informed of bed offers received.  Patient chooses bed at [facility]     Physician recommends and patient chooses bed at [facility]    Patient to be transferred to [facility] on 10/27/17.  Patient to be transferred to facility by EMS      Patient family notified on 10/27/17 of transfer.  Name of family member notified:  [name], Daughter     PHYSICIAN       Additional Comment:    _______________________________________________ Servando Snare, LCSW 10/27/2017, 3:35 PM

## 2017-10-27 NOTE — Discharge Instructions (Signed)

## 2017-10-27 NOTE — Discharge Summary (Signed)
Physician Discharge Summary  Betty Carpenter:[id number] DOB: Sep 17, 1925 DOA: 10/22/2017  PCP: Patient, No Pcp Per  Admit date: 10/22/2017 Discharge date: 10/27/2017  Recommendations for Outpatient Follow-up:  1. Pt will need to follow up with PCP in 2-3 weeks post discharge 2. Please obtain BMP to evaluate electrolytes and kidney function 3. Please also check CBC to evaluate Hg and Hct levels 4. Please note that pt is to continue Cipro for 5 more days post discharge  5. Per GI doctor Dr. Watt Climes, pt to call GI office Dr. Paulita Fujita [phone] to scheduled outpatient follow up for consideration of EUS 6. Xarelto was on the medication list, pt thinks she is still taking it, please consider repeat US to rule out DVT and perhaps discontinuation of anticoagulation   Discharge Diagnoses:  Principal Problem:   Sepsis ([place]) Active Problems:   History of breast cancer   History of DVT (deep vein thrombosis)   Chronic back pain   Hypertensive urgency   Chronic, continuous use of opioids  Discharge Condition: Stable  Diet recommendation: soft diet  History of present illness:  83 y.o.femalewith medical history significant ofHTN, chronic back pain, chronic opioid use, DVT on anticoagulation of Xarelto, and breast cancers/pmastectomy;who initially presented to [facility] on Tuesday October 20, 2017 with complaints of severe abdominal pain with nausea and vomiting. Patient vomited several times prior to arrival and it was noted to be nonbloody in appearance.   After initial evaluation patient was noted to have a lab work on 12/4 showed lactic acid of 2.7 with a repeat trending upward to 3.1.White blood cell count count was initially noted to be normal at 6.CMP with GFR was noted to be relatively within normal limits, except anion gap 19, total bilirubin 2.2, AST 398.7, ALT 220, alkaline phosphatase 110, albumin 4.9. Previous records noted that these levels have been  previously normal. Urinalysis showed signs of trace protein, small amount of blood, urobilinogen high at 4, andtrace leukocyte esterase.CT scan of the abdomen showed diffuse colonic wall thickening and pericolonic fat stranding compatible with mild colitis and severe intrahepatic biliary duct dilatation with common bile duct dilatation of 15 mm and main pancreatic duct dilatation to 7 mm progressed from 2015, and anew 7 mm cystic lesion in the pancreatic body. Blood and urine cultures were obtained and patient was started on empiric antibiotics of Zosyn.   Due to persistent nausea and vomiting NG tube ultimately had to be placed to low intermittent suction. Family notes generalsurgery was consulted and recommendedMRCP.MRCP was obtained on hospital day 2,but did not show any clear signs of a calculus within the distal common bile duct. there was persistent dilatation of the common bile duct and intrahepatic ducts which was mildly progressed and periampullary duodenal diverticulum noted. Labs from 12/6revealed WBC 19.5, hemoglobin 16, platelets 192, INR 1.3, sodium 136, potassium 3.4, chloride 96, CO2 21.5, BUN 25, creatinine 0.71, total bilirubin 4.2, direct bilirubin 3, indirect 1.2, AST 225.8, ALT 274, alkaline phosphatase 180, lipase 19. General surgery recommended GI consultation,but those services were not available at the facility. [facility] called to transfer the patient to [facility] for need of ERCP and GI consultative services.  Assessment/Plan:  Possible sepsis with cholangitis, abdominal pain on admission, N/V: Acute. - initial work up with significant dilatation of the biliary and pancreatic ducts, no stones on MRCP - diet has been advanced and pt has tolerated well - GI team has been consulted, recommended slow diet advancement with no interventions planned inpatient  -  continue analgesia as needed  - has been on Zosyn since admission but will change to oral Cipro to complete  therapy for 5 more days  - LFT's trending down, WBC has remained WNL  Acute encephalopathy: possible due to infection - resolved   Hypertensive urgency: Initial blood pressures noted systolics as high as 062 - overall reasonably stable   Anxiety - stable - resume home medical regimen    Chronic back pain with chronic opioid use:  - Patient suffers from low back pain history of bulging disks. - continue home medical regimen   Hypokalemia - supplemented   History of DVT: - Patient off Xarelto since 10/21/2017. Family reports single episode of DVT in the past. - Recommended to discuss with her PCP to discontinue  xarelto if no recurrent DVT or risks  - for now on Xarelto   History of breast cancer: Patient status post mastectomy and reportedly did not require chemoradiation.  Code Status: DNR Family Communication: pt updated at bedside  Disposition Plan:  SNF   Consultants:  GI   Discharge Exam: Vitals:   10/26/17 2018 10/27/17 0500  BP: 133/66 (!) 152/79  Pulse:  70  Resp:  20  Temp:  98.4 F (36.9 C)  SpO2:  99%   Vitals:   10/26/17 0529 10/26/17 1321 10/26/17 2018 10/27/17 0500  BP: 128/66 (!) 121/58 133/66 (!) 152/79  Pulse: 81 78  70  Resp: 17 18  20   Temp: 98.6 F (37 C) 98.6 F (37 C)  98.4 F (36.9 C)  TempSrc: Oral Oral  Oral  SpO2: 99% 98%  99%  Weight:      Height:        General: Pt is alert, follows commands appropriately, not in acute distress Cardiovascular: Regular rate and rhythm, S1/S2 +,  no rubs, no gallops Respiratory: Clear to auscultation bilaterally, no wheezing, no crackles, no rhonchi Abdominal: Soft, non tender, non distended, bowel sounds +, no guarding Extremities: no edema, no cyanosis, pulses palpable bilaterally DP and PT  Discharge Instructions   Allergies as of 10/27/2017      Reactions   Cymbalta [duloxetine Hcl]    " couldn't make water"- unable to urinate      Medication List    STOP taking these  medications   docusate sodium 100 MG capsule Commonly known as:  COLACE   loperamide 2 MG capsule Commonly known as:  IMODIUM   magnesium hydroxide 400 MG/5ML suspension Commonly known as:  MILK OF MAGNESIA   Oxycodone HCl 20 MG Tabs     TAKE these medications   acetaminophen 500 MG tablet Commonly known as:  TYLENOL Take 1,000 mg by mouth every 6 (six) hours as needed for mild pain or headache.   amLODipine 5 MG tablet Commonly known as:  NORVASC Take 1 tablet (5 mg total) by mouth 2 (two) times daily. Hold if SBP <150 and DBP <70 What changed:    medication strength  how much to take   celecoxib 200 MG capsule Commonly known as:  CELEBREX Take 200 mg by mouth 2 (two) times daily.   ciprofloxacin 500 MG tablet Commonly known as:  CIPRO Take 1 tablet (500 mg total) by mouth 2 (two) times daily.   LORazepam 0.5 MG tablet Commonly known as:  ATIVAN Take 1 tablet (0.5 mg total) by mouth 2 (two) times daily as needed for anxiety. What changed:    when to take this  reasons to take this   lubiprostone  24 MCG capsule Commonly known as:  AMITIZA Take 24 mcg by mouth 2 (two) times daily.   morphine 30 MG 12 hr tablet Commonly known as:  MS CONTIN Take 1 tablet (30 mg total) by mouth 2 (two) times daily. Hold if BP <110/60   ondansetron 4 MG disintegrating tablet Commonly known as:  ZOFRAN-ODT Take 4 mg by mouth every 6 (six) hours as needed for nausea or vomiting.   tiZANidine 4 MG tablet Commonly known as:  ZANAFLEX Take 1 tablet (4 mg total) by mouth every 8 (eight) hours as needed for muscle spasms. 4mg  three times a day. Also 4mg  every 8 hours as needed for pain. What changed:    when to take this  reasons to take this   Vitamin D (Ergocalciferol) 50000 units Caps capsule Commonly known as:  DRISDOL Take 50,000 Units by mouth See admin instructions. Twice a week. Days not specified on NH MAR.   XARELTO 20 MG Tabs tablet Generic drug:   rivaroxaban Take 20 mg by mouth daily.       Follow-up Information    Theodis Blaze, MD Follow up.   Specialty:  Internal Medicine Contact information: [address] [place] [address] [phone]            The results of significant diagnostics from this hospitalization (including imaging, microbiology, ancillary and laboratory) are listed below for reference.     Microbiology: Recent Results (from the past 240 hour(s))  Culture, blood (x 2)     Status: None (Preliminary result)   Collection Time: 10/22/17  8:18 PM  Result Value Ref Range Status   Specimen Description BLOOD LEFT HAND  Final   Special Requests IN PEDIATRIC BOTTLE Blood Culture adequate volume  Final   Culture   Final    NO GROWTH 3 DAYS Performed at [facility], [address], [place], [place] [address]    Report Status PENDING  Incomplete  Culture, blood (x 2)     Status: None (Preliminary result)   Collection Time: 10/22/17  8:19 PM  Result Value Ref Range Status   Specimen Description BLOOD LEFT ARM  Final   Special Requests IN PEDIATRIC BOTTLE Blood Culture adequate volume  Final   Culture   Final    NO GROWTH 3 DAYS Performed at [facility], [place] [address], [place], [place] [address]    Report Status PENDING  Incomplete  MRSA PCR Screening     Status: None   Collection Time: 10/23/17  9:39 AM  Result Value Ref Range Status   MRSA by PCR NEGATIVE NEGATIVE Final    Comment:        The GeneXpert MRSA Assay (FDA approved for NASAL specimens only), is one component of a comprehensive MRSA colonization surveillance program. It is not intended to diagnose MRSA infection nor to guide or monitor treatment for MRSA infections.      Labs: Basic Metabolic Panel: Recent Labs  Lab 10/23/17 0801 10/24/17 0624 10/25/17 0443 10/26/17 0521 10/27/17 0602  NA 137 139 139 136 137  K 3.1* 3.2* 3.9 3.9 3.9  CL 103 102 107 105 101  CO2 24 27 26 26 29    GLUCOSE 124* 134* 95 99 103*  BUN 26* 22* 21* 20 22*  CREATININE 0.72 0.63 0.69 0.73 0.77  CALCIUM 8.8* 9.1 8.5* 8.4* 8.7*  MG 2.4  --   --   --   --    Liver  Function Tests: Recent Labs  Lab 10/22/17 2309 10/23/17 0801 10/24/17 0624 10/25/17 0443 10/26/17 0521  AST 118* 98* 88* 58* 62*  ALT 175* 151* 116* 77* 76*  ALKPHOS 127* 124 131* 90 83  BILITOT 2.1* 1.5* 2.3* 1.6* 1.0  PROT 6.8 6.8 7.1 5.6* 5.4*  ALBUMIN 3.5 3.7 4.1 3.2* 3.0*   No results for input(s): LIPASE, AMYLASE in the last 168 hours. No results for input(s): AMMONIA in the last 168 hours. CBC: Recent Labs  Lab 10/22/17 2309 10/23/17 0801 10/25/17 0443 10/26/17 0521 10/27/17 0602  WBC 18.1* 15.0* 6.9 7.0 6.5  NEUTROABS 16.5* 13.9*  --   --   --   HGB 14.5 14.7 12.6 11.4* 11.3*  HCT 40.3 42.3 38.3 34.8* 34.6*  MCV 83.3 83.9 87.0 86.8 86.9  PLT 156 144* 126* 136* 160    SIGNED: Time coordinating discharge: 60 minutes  Faye Ramsay, MD  [facility] 10/27/2017, 11:14 AM Pager [phone]  If 7PM-7AM, please contact night-coverage [email or web address] Password [email or web address]

## 2017-10-27 NOTE — Progress Notes (Signed)
Patient and family at bedside, requesting to go to [facility] rehab when discharged.  Attempted to call social work, will retry later.

## 2017-10-27 NOTE — Clinical Social Work Note (Signed)
Clinical Social Work Assessment  Patient Details  Name: Betty Carpenter MRN: [id number] Date of Birth: 1924/12/24  Date of referral:  10/27/17               Reason for consult:  Facility Placement, Discharge Planning                Permission sought to share information with:  Case Manager, Facility Sport and exercise psychologist, Family Supports Permission granted to share information::     Name::     [name],   Agency::     Relationship::  Daughter  Contact Information:     Housing/Transportation Living arrangements for the past 2 months:  Assisted Living Facility([facility] ) Source of Information:  Adult Children Patient Interpreter Needed:  None Criminal Activity/Legal Involvement Pertinent to Current Situation/Hospitalization:  No - Comment as needed Significant Relationships:  Adult Children Lives with:  Facility Resident Do you feel safe going back to the place where you live?  Yes Need for family participation in patient care:  Yes (Comment)  Care giving concerns:  No care giving concerns at the time of assessment.    Social Worker assessment / plan:  LCSW following for SNF placement.  Patient admitted to the hospital for Sepsis.   Patient has a medical history significant of HTN, chronic back pain, chronic opioid use, DVT on anticoagulation of Xarelto, and breast cancer s/p mastectomy.  LCSW met with patient at bedside. Daughter who is POA was present.   Daughter reports that patient is a resident at [facility]. At the facility it reported that patient ambulates with a walker.   Patient and daughter are agreeable to SNF. Prefers [facility] in [place].   PLAN: Patient will go to SNF at DC.    Employment status:  Retired Forensic scientist:  Medicare PT Recommendations:  [place], [place] / Referral to community resources:     Patient/Family's Response to care:  Not  assessed.  Patient/Family's Understanding of and Emotional Response to Diagnosis, Current Treatment, and Prognosis:  Patient and daughter are understanding of current diagnosis and agreeable to treatment plan.  Emotional Assessment Appearance:  Appears younger than stated age Attitude/Demeanor/Rapport:  Unable to Assess Affect (typically observed):  Calm Orientation:  Oriented to Self, Oriented to Place Alcohol / Substance use:  Not Applicable Psych involvement (Current and /or in the community):  No (Comment)  Discharge Needs  Concerns to be addressed:  No discharge needs identified Readmission within the last 30 days:  No Current discharge risk:  None Barriers to Discharge:  No SNF bed   Servando Snare, LCSW 10/27/2017, 2:04 PM

## 2017-10-27 NOTE — Care Management Important Message (Signed)
Important Message  Patient Details  Name: Betty Carpenter MRN: [id number] Date of Birth: [birth year]   Medicare Important Message Given:  Yes    Kerin Salen 10/27/2017, 12:50 [place] Message  Patient Details  Name: Betty Carpenter MRN: [id number] Date of Birth: 1924-12-28   Medicare Important Message Given:  Yes    Kerin Salen 10/27/2017, 12:50 PM

## 2017-10-27 NOTE — Progress Notes (Signed)
Report given to [facility] at [facility].

## 2017-10-27 NOTE — Plan of Care (Signed)
Patient appears very unsteady going to bathroom frequently overnight.  One loose stool in bed.  Vitals remained stable.

## 2017-10-27 NOTE — Progress Notes (Signed)
[facility] arrived [facility].

## 2017-10-27 NOTE — [place] FL2 (Signed)
[place] MEDICAID FL2 LEVEL OF CARE SCREENING TOOL     IDENTIFICATION  Patient Name: TRENACE COUGHLIN Birthdate: [birth year] Sex: female Admission Date (Current Location): 10/22/2017  [facility] and [place] Number:  Herbalist and Address:  [facility],  [place] [address], [place]      Provider Number: [id number]  Attending Physician Name and Address:  Theodis Blaze, MD  Relative Name and Phone Number:       Current Level of Care: Hospital Recommended Level of Care: [place] Prior Approval Number:    Date Approved/Denied:   PASRR Number:    Discharge Plan: SNF    Current Diagnoses: Patient Active Problem List   Diagnosis Date Noted  . History of breast cancer 10/23/2017  . History of DVT (deep vein thrombosis) 10/23/2017  . Chronic back pain 10/23/2017  . Benign essential HTN 10/23/2017  . Hypertensive urgency 10/23/2017  . Chronic, continuous use of opioids 10/23/2017  . Sepsis ([place]) 10/22/2017  . Breast cancer, stage 1 ([place]) 01/28/2012    Orientation RESPIRATION BLADDER Height & Weight     Self, Place  Normal Continent Weight: 123 lb 3.8 oz (55.9 kg) Height:  5\' 10"  (177.8 cm)  BEHAVIORAL SYMPTOMS/MOOD NEUROLOGICAL BOWEL NUTRITION STATUS      Continent Diet(See DC summary)  AMBULATORY STATUS COMMUNICATION OF NEEDS Skin   Extensive Assist Verbally Normal                       Personal Care Assistance Level of Assistance  Bathing, Feeding, Dressing Bathing Assistance: Limited assistance Feeding assistance: Independent Dressing Assistance: Limited assistance     Functional Limitations Info  Sight, Hearing, Speech Sight Info: Adequate Hearing Info: Adequate Speech Info: Adequate    SPECIAL CARE FACTORS FREQUENCY  PT (By licensed PT), OT (By licensed OT)     PT Frequency: 5x/week OT Frequency: 5x/week            Contractures Contractures Info: Not present    Additional Factors Info  Code  Status, Allergies Code Status Info: DNR Allergies Info: Cymbalta Duloxetine Hcl           Current Medications (10/27/2017):  This is the current hospital active medication list Current Facility-Administered Medications  Medication Dose Route Frequency Provider Last Rate Last Dose  . acetaminophen (TYLENOL) tablet 650 mg  650 mg Oral Q6H PRN Fuller Plan A, MD   650 mg at 10/27/17 1425   Or  . acetaminophen (TYLENOL) suppository 650 mg  650 mg Rectal Q6H PRN Fuller Plan A, MD      . amLODipine (NORVASC) tablet 10 mg  10 mg Oral Daily Kinnie Feil, MD   10 mg at 10/27/17 0924  . hydrALAZINE (APRESOLINE) tablet 25 mg  25 mg Oral Q8H Buriev, Arie Sabina, MD   25 mg at 10/27/17 1424  . HYDROmorphone (DILAUDID) injection 0.5 mg  0.5 mg Intravenous Q4H PRN Kinnie Feil, MD   0.5 mg at 10/27/17 1120  . ipratropium-albuterol (DUONEB) 0.5-2.5 (3) MG/3ML nebulizer solution 3 mL  3 mL Nebulization Q6H PRN Smith, Rondell A, MD      . labetalol (NORMODYNE,TRANDATE) injection 5 mg  5 mg Intravenous Q2H PRN Fuller Plan A, MD   5 mg at 10/24/17 0752  . LORazepam (ATIVAN) injection 0.5 mg  0.5 mg Intravenous BID PRN Fuller Plan A, MD   0.5 mg at 10/23/17 0000  . morphine (MS CONTIN) 12 hr tablet  30 mg  30 mg Oral Q12H Kinnie Feil, MD   30 mg at 10/27/17 3524  . ondansetron (ZOFRAN) tablet 4 mg  4 mg Oral Q6H PRN Fuller Plan A, MD       Or  . ondansetron (ZOFRAN) injection 4 mg  4 mg Intravenous Q6H PRN Smith, Rondell A, MD      . piperacillin-tazobactam (ZOSYN) IVPB 3.375 g  3.375 g Intravenous Q8H Smith, Rondell A, MD 12.5 mL/hr at 10/27/17 1245 3.375 g at 10/27/17 1245     Discharge Medications: Please see discharge summary for a list of discharge medications.  Relevant Imaging Results:  Relevant Lab Results:   Additional Information ssn: [id number]  Servando Snare, LCSW

## 2017-10-28 LAB — CULTURE, BLOOD (ROUTINE X 2)
CULTURE: NO GROWTH
CULTURE: NO GROWTH
SPECIAL REQUESTS: ADEQUATE
Special Requests: ADEQUATE

## 2018-02-26 ENCOUNTER — Emergency Department (HOSPITAL_COMMUNITY): Payer: Medicare Other

## 2018-02-26 ENCOUNTER — Encounter (HOSPITAL_COMMUNITY): Payer: Self-pay | Admitting: Emergency Medicine

## 2018-02-26 ENCOUNTER — Other Ambulatory Visit: Payer: Self-pay

## 2018-02-26 ENCOUNTER — Emergency Department (HOSPITAL_COMMUNITY)
Admission: EM | Admit: 2018-02-26 | Discharge: 2018-02-26 | Disposition: A | Discharge status: Home / Self Care | Payer: Medicare Other | Attending: Emergency Medicine | Admitting: Emergency Medicine

## 2018-02-26 DIAGNOSIS — S22080A Wedge compression fracture of T11-T12 vertebra, initial encounter for closed fracture: Secondary | ICD-10-CM | POA: Insufficient documentation

## 2018-02-26 DIAGNOSIS — R011 Cardiac murmur, unspecified: Secondary | ICD-10-CM | POA: Insufficient documentation

## 2018-02-26 DIAGNOSIS — Y999 Unspecified external cause status: Secondary | ICD-10-CM | POA: Diagnosis not present

## 2018-02-26 DIAGNOSIS — Z853 Personal history of malignant neoplasm of breast: Secondary | ICD-10-CM | POA: Insufficient documentation

## 2018-02-26 DIAGNOSIS — M5136 Other intervertebral disc degeneration, lumbar region: Secondary | ICD-10-CM | POA: Insufficient documentation

## 2018-02-26 DIAGNOSIS — Z7901 Long term (current) use of anticoagulants: Secondary | ICD-10-CM | POA: Diagnosis not present

## 2018-02-26 DIAGNOSIS — I10 Essential (primary) hypertension: Secondary | ICD-10-CM | POA: Diagnosis not present

## 2018-02-26 DIAGNOSIS — Y92129 Unspecified place in nursing home as the place of occurrence of the external cause: Secondary | ICD-10-CM | POA: Diagnosis not present

## 2018-02-26 DIAGNOSIS — Y939 Activity, unspecified: Secondary | ICD-10-CM | POA: Insufficient documentation

## 2018-02-26 DIAGNOSIS — Y33XXXA Other specified events, undetermined intent, initial encounter: Secondary | ICD-10-CM | POA: Insufficient documentation

## 2018-02-26 DIAGNOSIS — G8929 Other chronic pain: Secondary | ICD-10-CM | POA: Insufficient documentation

## 2018-02-26 DIAGNOSIS — M549 Dorsalgia, unspecified: Secondary | ICD-10-CM | POA: Diagnosis present

## 2018-02-26 DIAGNOSIS — I499 Cardiac arrhythmia, unspecified: Secondary | ICD-10-CM | POA: Insufficient documentation

## 2018-02-26 HISTORY — DX: Wedge compression fracture of first lumbar vertebra, initial encounter for closed fracture: S32.010A

## 2018-02-26 HISTORY — DX: Sciatica, unspecified side: M54.30

## 2018-02-26 HISTORY — DX: Other chronic pain: G89.29

## 2018-02-26 HISTORY — DX: Dorsalgia, unspecified: M54.9

## 2018-02-26 LAB — URINALYSIS, ROUTINE W REFLEX MICROSCOPIC
BILIRUBIN URINE: NEGATIVE
Bacteria, UA: NONE SEEN
Glucose, UA: NEGATIVE mg/dL
Ketones, ur: 5 mg/dL — AB
LEUKOCYTES UA: NEGATIVE
NITRITE: NEGATIVE
PH: 8 (ref 5.0–8.0)
Protein, ur: NEGATIVE mg/dL
SPECIFIC GRAVITY, URINE: 1.008 (ref 1.005–1.030)

## 2018-02-26 MED ORDER — ONDANSETRON HCL 4 MG/2ML IJ SOLN
4.0000 mg | Freq: Once | INTRAMUSCULAR | Status: AC
  Administered 2018-02-26: 4 mg via INTRAVENOUS
  Filled 2018-02-26: qty 2

## 2018-02-26 MED ORDER — HYDROMORPHONE HCL 1 MG/ML IJ SOLN
0.5000 mg | Freq: Once | INTRAMUSCULAR | Status: AC
  Administered 2018-02-26: 0.5 mg via INTRAVENOUS
  Filled 2018-02-26: qty 1

## 2018-02-26 NOTE — Discharge Instructions (Addendum)
The ultrasound of your aorta is negative for an aneurysm.  The CT scan of your lower back suggest a possible new compression fracture at the T12 area.  This is quite possibly what is been causing the increase in your pain.  Please rest her back is much as possible.  Please resume your pain medications.  Please discuss these findings with Dr. Edrick Oh.

## 2018-02-26 NOTE — ED Provider Notes (Signed)
[facility] EMERGENCY DEPARTMENT Provider Note   CSN: [id number] Arrival date & time: 02/26/18  0757     History   Chief Complaint Chief Complaint  Patient presents with  . Back Pain    HPI Betty Carpenter is a 84 y.o. female.  Patient is a 84 year old female who presents to the emergency department by EMS from an assisted living facility because of back pain.  The information is obtained from the patient as well as from the nursing home records and EMS.  The patient states that she has been having back pain for several months.  She is on medication for these pains, but states that she is not getting relief from her pain.  She states that particularly on last night the pain got worse and she was brought to the emergency department for assistance with this pain.  She was given morphine tablet during the early morning, but states this has not relieved her pain.  She denies any recent falls or injuries.  She has not had any recent operations or procedures.  Review of previous imaging and records reveals a previous compression fracture at the L2 area.  Multiple areas of spondylosis and disc bulging, some with nerve root irritation.  The history is provided by the patient and the nursing home.  Back Pain   Pertinent negatives include no chest pain, no abdominal pain and no dysuria.    Past Medical History:  Diagnosis Date  . Arthritis   . Breast cancer ([place])   . Chronic back pain   . Hypertension   . Sciatica     Patient Active Problem List   Diagnosis Date Noted  . History of breast cancer 10/23/2017  . History of DVT (deep vein thrombosis) 10/23/2017  . Chronic back pain 10/23/2017  . Benign essential HTN 10/23/2017  . Hypertensive urgency 10/23/2017  . Chronic, continuous use of opioids 10/23/2017  . Sepsis ([place]) 10/22/2017  . Breast cancer, stage 1 ([place]) 01/28/2012    Past Surgical History:  Procedure Laterality Date  . APPENDECTOMY       OB History   None       Home Medications    Prior to Admission medications   Medication Sig Start Date End Date Taking? Authorizing Provider  acetaminophen (TYLENOL) 500 MG tablet Take 1,000 mg by mouth every 6 (six) hours as needed for mild pain or headache.     [provider]  amLODipine (NORVASC) 5 MG tablet Take 1 tablet (5 mg total) by mouth 2 (two) times daily. Hold if SBP <150 and DBP <70 10/27/17   Theodis Blaze, MD  celecoxib (CELEBREX) 200 MG capsule Take 200 mg by mouth 2 (two) times daily.  10/06/17   [provider]  ciprofloxacin (CIPRO) 500 MG tablet Take 1 tablet (500 mg total) by mouth 2 (two) times daily. 10/27/17   Theodis Blaze, MD  LORazepam (ATIVAN) 0.5 MG tablet Take 1 tablet (0.5 mg total) by mouth 2 (two) times daily as needed for anxiety. 10/27/17   Theodis Blaze, MD  lubiprostone (AMITIZA) 24 MCG capsule Take 24 mcg by mouth 2 (two) times daily.    [provider]  morphine (MS CONTIN) 30 MG 12 hr tablet Take 1 tablet (30 mg total) by mouth 2 (two) times daily. Hold if BP <110/60 10/27/17   Theodis Blaze, MD  ondansetron (ZOFRAN-ODT) 4 MG disintegrating tablet Take 4 mg by mouth every 6 (six) hours as needed for nausea  or vomiting.  07/23/17   [provider]  tiZANidine (ZANAFLEX) 4 MG tablet Take 1 tablet (4 mg total) by mouth every 8 (eight) hours as needed for muscle spasms. 4mg  three times a day. Also 4mg  every 8 hours as needed for pain. 10/27/17   Theodis Blaze, MD  Vitamin D, Ergocalciferol, (DRISDOL) 50000 units CAPS capsule Take 50,000 Units by mouth See admin instructions. Twice a week. Days not specified on NH MAR. 08/21/16   [provider]  XARELTO 20 MG TABS tablet Take 20 mg by mouth daily.  10/06/17   [provider]    Family History Family History  Problem Relation Age of Onset  . Cancer Maternal Grandfather     Social History Social History   Tobacco Use  . Smoking status: Never Smoker  . Smokeless  tobacco: Never Used  Substance Use Topics  . Alcohol use: Not on file  . Drug use: Not on file     Allergies   Cymbalta [duloxetine hcl]   Review of Systems Review of Systems  Constitutional: Positive for activity change.       All ROS Neg except as noted in HPI  HENT: Negative for nosebleeds.   Eyes: Negative for photophobia and discharge.  Respiratory: Negative for cough, shortness of breath and wheezing.   Cardiovascular: Negative for chest pain and palpitations.  Gastrointestinal: Negative for abdominal pain and blood in stool.  Genitourinary: Negative for dysuria, frequency and hematuria.  Musculoskeletal: Positive for arthralgias and back pain. Negative for neck pain.  Skin: Negative.   Neurological: Negative for dizziness, seizures and speech difficulty.  Psychiatric/Behavioral: Negative for confusion and hallucinations.     Physical Exam Updated Vital Signs BP (!) 154/88 (BP Location: Left Arm)   Pulse 65   Temp 97.9 F (36.6 C) (Oral)   Resp 18   Ht 5\' 10"  (1.778 m)   Wt 60.3 kg (133 lb)   SpO2 100%   BMI 19.08 kg/m   Physical Exam  Constitutional: She is oriented to person, place, and time. She appears well-developed and well-nourished.  Non-toxic appearance.  HENT:  Head: Normocephalic.  Right Ear: Tympanic membrane and external ear normal.  Left Ear: Tympanic membrane and external ear normal.  Eyes: Pupils are equal, round, and reactive to light. EOM and lids are normal.  Neck: Normal range of motion. Neck supple. Carotid bruit is not present.  Cardiovascular: Normal rate, intact distal pulses and normal pulses. An irregularly irregular rhythm present.  Murmur heard.  Systolic murmur is present with a grade of 2/6. Pulmonary/Chest: Breath sounds normal. No respiratory distress.  Abdominal: Soft. Bowel sounds are normal. There is no tenderness. There is no guarding.  Abdomen is nontender.  No mass, no pulsating mass appreciated.  Musculoskeletal:  Normal range of motion. She exhibits tenderness.       Lumbar back: She exhibits bony tenderness and pain.  Right and left radial pulses are 2+.  Capillary refill is less than 2 seconds.  Right and left dorsalis pedis pulses are 2+, capillary refill is less than 2 seconds.  There is no edema of the lower extremity.  There are no temperature changes of the right versus left upper or lower extremities.  Lymphadenopathy:       Head (right side): No submandibular adenopathy present.       Head (left side): No submandibular adenopathy present.    She has no cervical adenopathy.  Neurological: She is alert and oriented to person, place,  and time. She has normal strength. No cranial nerve deficit or sensory deficit.  Skin: Skin is warm and dry.  Psychiatric: She has a normal mood and affect. Her speech is normal.  Nursing note and vitals reviewed.    ED Treatments / Results  Labs (all labs ordered are listed, but only abnormal results are displayed) Labs Reviewed  URINALYSIS, ROUTINE W REFLEX MICROSCOPIC - Abnormal; Notable for the following components:      Result Value   Color, Urine STRAW (*)    Hgb urine dipstick SMALL (*)    Ketones, ur 5 (*)    Squamous Epithelial / LPF 0-5 (*)    All other components within normal limits    EKG None  Radiology No results found.  Procedures Procedures (including critical care time)  Medications Ordered in ED Medications  HYDROmorphone (DILAUDID) injection 0.5 mg (has no administration in time range)  ondansetron (ZOFRAN) injection 4 mg (has no administration in time range)     Initial Impression / Assessment and Plan / ED Course  I have reviewed the triage vital signs and the nursing notes.  Pertinent labs & imaging results that were available during my care of the patient were reviewed by me and considered in my medical decision making (see chart for details).       Final Clinical Impressions(s) / ED Diagnoses MDM  Vital signs  within normal limits.  Pulse oximetry is 100% on room air.  Patient moans in pain.  IV access requested.  Will use IV medication for pain for now.  Urine analysis shows a clear straw-colored urine with a specific gravity 1.008.  There is a small amount of blood on the dipstick, but only 0-5 red cells, and 0-5 white blood cells per high-powered field. Will obtain An abdominal ultrasound to evaluate the aorta as a source of this increasing back pain.  We will also obtain a CT scan to rule out any new compression fractures or other new abnormality.  Recheck.  Back pain improving significantly.  Ultrasound of the order is negative for an abdominal aortic aneurysm.  Patient has minimal aneurysm at the iliac area.  CT scan of the lumbar spine shows acute or subacute T12 compression fracture with minimal vertebral body height loss and no retropulsion.  Again is seen the chronic L1 compression fracture.  Also there is noted progression of the degenerative disc disease involving the lumbar spine.  I discussed these findings with the patient as well as with the patient's family in terms which they understand.  I have asked him to rest the back is much as possible.  To resume the pain medications at the nursing facility.  They will make Dr. Edrick Oh aware of the new findings, and arrange follow-up.   Final diagnoses:  T12 compression fracture ([place])  DDD (degenerative disc disease), lumbar    ED Discharge Orders    None       Lily Kocher, PA-C 02/26/18 [place], [place], DO 02/27/18 [phone]

## 2018-02-26 NOTE — ED Triage Notes (Signed)
Pt brought in from assisted living for lower back pain since last night.  Pt states she normally has this pain but her pain medications are not helping anymore.  Given Hydrocodone and Morphine with no relief, but unsure of time.

## 2018-02-26 NOTE — ED Notes (Signed)
Attempted to call [facility] again with no answer.

## 2018-02-26 NOTE — ED Notes (Signed)
Attempted to call [facility] again, no answer. Family member taking d/c paperwork and pt back to facility.

## 2018-02-26 NOTE — ED Notes (Signed)
Family member will transport pt back to [facility]. Made family aware this RN has attempted multiple times to call report but no one has answered the phone.

## 2018-02-26 NOTE — ED Notes (Signed)
Attempted to call [facility] twice with continuous ringing.

## 2018-03-15 DIAGNOSIS — S22000A Wedge compression fracture of unspecified thoracic vertebra, initial encounter for closed fracture: Secondary | ICD-10-CM | POA: Insufficient documentation

## 2018-11-02 DIAGNOSIS — I890 Lymphedema, not elsewhere classified: Secondary | ICD-10-CM | POA: Insufficient documentation

## 2018-11-02 DIAGNOSIS — L97909 Non-pressure chronic ulcer of unspecified part of unspecified lower leg with unspecified severity: Secondary | ICD-10-CM | POA: Insufficient documentation

## 2019-03-17 ENCOUNTER — Emergency Department (HOSPITAL_COMMUNITY): Payer: Medicare Other

## 2019-03-17 ENCOUNTER — Inpatient Hospital Stay (HOSPITAL_COMMUNITY): Payer: Medicare Other

## 2019-03-17 ENCOUNTER — Inpatient Hospital Stay (HOSPITAL_COMMUNITY)
Admission: EM | Admit: 2019-03-17 | Discharge: 2019-03-22 | DRG: 481 | Disposition: A | Discharge status: Skilled Nursing Facility | Payer: Medicare Other | Source: Skilled Nursing Facility | Attending: Internal Medicine | Admitting: Internal Medicine

## 2019-03-17 ENCOUNTER — Other Ambulatory Visit: Payer: Self-pay

## 2019-03-17 ENCOUNTER — Encounter (HOSPITAL_COMMUNITY): Payer: Self-pay | Admitting: Emergency Medicine

## 2019-03-17 DIAGNOSIS — Z86718 Personal history of other venous thrombosis and embolism: Secondary | ICD-10-CM

## 2019-03-17 DIAGNOSIS — Y92098 Other place in other non-institutional residence as the place of occurrence of the external cause: Secondary | ICD-10-CM | POA: Diagnosis not present

## 2019-03-17 DIAGNOSIS — Z853 Personal history of malignant neoplasm of breast: Secondary | ICD-10-CM

## 2019-03-17 DIAGNOSIS — F419 Anxiety disorder, unspecified: Secondary | ICD-10-CM

## 2019-03-17 DIAGNOSIS — Z79891 Long term (current) use of opiate analgesic: Secondary | ICD-10-CM | POA: Diagnosis not present

## 2019-03-17 DIAGNOSIS — Z8781 Personal history of (healed) traumatic fracture: Secondary | ICD-10-CM | POA: Diagnosis not present

## 2019-03-17 DIAGNOSIS — S72001D Fracture of unspecified part of neck of right femur, subsequent encounter for closed fracture with routine healing: Secondary | ICD-10-CM | POA: Diagnosis not present

## 2019-03-17 DIAGNOSIS — C50919 Malignant neoplasm of unspecified site of unspecified female breast: Secondary | ICD-10-CM | POA: Diagnosis present

## 2019-03-17 DIAGNOSIS — W1830XA Fall on same level, unspecified, initial encounter: Secondary | ICD-10-CM | POA: Diagnosis present

## 2019-03-17 DIAGNOSIS — Z7901 Long term (current) use of anticoagulants: Secondary | ICD-10-CM

## 2019-03-17 DIAGNOSIS — S72001A Fracture of unspecified part of neck of right femur, initial encounter for closed fracture: Secondary | ICD-10-CM | POA: Diagnosis present

## 2019-03-17 DIAGNOSIS — W19XXXA Unspecified fall, initial encounter: Secondary | ICD-10-CM

## 2019-03-17 DIAGNOSIS — G8929 Other chronic pain: Secondary | ICD-10-CM | POA: Diagnosis present

## 2019-03-17 DIAGNOSIS — R000 Tachycardia, unspecified: Secondary | ICD-10-CM

## 2019-03-17 DIAGNOSIS — Z681 Body mass index (BMI) 19 or less, adult: Secondary | ICD-10-CM

## 2019-03-17 DIAGNOSIS — F0390 Unspecified dementia without behavioral disturbance: Secondary | ICD-10-CM | POA: Diagnosis present

## 2019-03-17 DIAGNOSIS — Z1159 Encounter for screening for other viral diseases: Secondary | ICD-10-CM | POA: Diagnosis not present

## 2019-03-17 DIAGNOSIS — I10 Essential (primary) hypertension: Secondary | ICD-10-CM | POA: Diagnosis present

## 2019-03-17 DIAGNOSIS — E440 Moderate protein-calorie malnutrition: Secondary | ICD-10-CM | POA: Diagnosis present

## 2019-03-17 DIAGNOSIS — F1190 Opioid use, unspecified, uncomplicated: Secondary | ICD-10-CM | POA: Diagnosis present

## 2019-03-17 DIAGNOSIS — S72011A Unspecified intracapsular fracture of right femur, initial encounter for closed fracture: Secondary | ICD-10-CM | POA: Diagnosis present

## 2019-03-17 DIAGNOSIS — Z01818 Encounter for other preprocedural examination: Secondary | ICD-10-CM | POA: Diagnosis present

## 2019-03-17 DIAGNOSIS — M545 Low back pain: Secondary | ICD-10-CM | POA: Diagnosis not present

## 2019-03-17 DIAGNOSIS — M549 Dorsalgia, unspecified: Secondary | ICD-10-CM | POA: Diagnosis present

## 2019-03-17 DIAGNOSIS — L8990 Pressure ulcer of unspecified site, unspecified stage: Secondary | ICD-10-CM

## 2019-03-17 DIAGNOSIS — Z419 Encounter for procedure for purposes other than remedying health state, unspecified: Secondary | ICD-10-CM

## 2019-03-17 LAB — CBC WITH DIFFERENTIAL/PLATELET
Abs Immature Granulocytes: 0.03 10*3/uL (ref 0.00–0.07)
Basophils Absolute: 0 10*3/uL (ref 0.0–0.1)
Basophils Relative: 1 %
Eosinophils Absolute: 0 10*3/uL (ref 0.0–0.5)
Eosinophils Relative: 0 %
HCT: 38.5 % (ref 36.0–46.0)
Hemoglobin: 12 g/dL (ref 12.0–15.0)
Immature Granulocytes: 1 %
Lymphocytes Relative: 9 %
Lymphs Abs: 0.5 10*3/uL — ABNORMAL LOW (ref 0.7–4.0)
MCH: 29.1 pg (ref 26.0–34.0)
MCHC: 31.2 g/dL (ref 30.0–36.0)
MCV: 93.2 fL (ref 80.0–100.0)
Monocytes Absolute: 0.4 10*3/uL (ref 0.1–1.0)
Monocytes Relative: 8 %
Neutro Abs: 4.2 10*3/uL (ref 1.7–7.7)
Neutrophils Relative %: 81 %
Platelets: 189 10*3/uL (ref 150–400)
RBC: 4.13 MIL/uL (ref 3.87–5.11)
RDW: 13.6 % (ref 11.5–15.5)
WBC: 5.1 10*3/uL (ref 4.0–10.5)
nRBC: 0 % (ref 0.0–0.2)

## 2019-03-17 LAB — BASIC METABOLIC PANEL
Anion gap: 11 (ref 5–15)
BUN: 21 mg/dL (ref 8–23)
CO2: 26 mmol/L (ref 22–32)
Calcium: 9 mg/dL (ref 8.9–10.3)
Chloride: 102 mmol/L (ref 98–111)
Creatinine, Ser: 0.83 mg/dL (ref 0.44–1.00)
GFR calc Af Amer: >60 mL/min (ref >60)
GFR calc non Af Amer: >60 mL/min (ref >60)
Glucose, Bld: 116 mg/dL — ABNORMAL HIGH (ref 70–99)
Potassium: 3.8 mmol/L (ref 3.5–5.1)
Sodium: 139 mmol/L (ref 135–145)

## 2019-03-17 LAB — PROTIME-INR
INR: 1 (ref 0.8–1.2)
Prothrombin Time: 12.8 seconds (ref 11.4–15.2)

## 2019-03-17 LAB — TYPE AND SCREEN
ABO/RH(D): A POS
Antibody Screen: NEGATIVE

## 2019-03-17 MED ORDER — LORAZEPAM 0.5 MG PO TABS
0.5000 mg | ORAL_TABLET | Freq: Two times a day (BID) | ORAL | Status: DC | PRN
  Administered 2019-03-18 – 2019-03-21 (×3): 0.5 mg via ORAL
  Filled 2019-03-17 (×4): qty 1

## 2019-03-17 MED ORDER — VITAMIN D (ERGOCALCIFEROL) 1.25 MG (50000 UNIT) PO CAPS
50000.0000 [IU] | ORAL_CAPSULE | ORAL | Status: DC
  Administered 2019-03-21: 50000 [IU] via ORAL
  Filled 2019-03-17: qty 1

## 2019-03-17 MED ORDER — AMLODIPINE BESYLATE 5 MG PO TABS
5.0000 mg | ORAL_TABLET | Freq: Two times a day (BID) | ORAL | Status: DC
  Administered 2019-03-17 – 2019-03-19 (×5): 5 mg via ORAL
  Filled 2019-03-17 (×6): qty 1

## 2019-03-17 MED ORDER — DOCUSATE SODIUM 100 MG PO CAPS
100.0000 mg | ORAL_CAPSULE | Freq: Two times a day (BID) | ORAL | Status: DC
  Administered 2019-03-17 – 2019-03-22 (×9): 100 mg via ORAL
  Filled 2019-03-17 (×9): qty 1

## 2019-03-17 MED ORDER — MORPHINE SULFATE ER 30 MG PO TBCR
30.0000 mg | EXTENDED_RELEASE_TABLET | Freq: Two times a day (BID) | ORAL | Status: DC
  Administered 2019-03-17 – 2019-03-22 (×10): 30 mg via ORAL
  Filled 2019-03-17 (×10): qty 1

## 2019-03-17 MED ORDER — CELECOXIB 100 MG PO CAPS
200.0000 mg | ORAL_CAPSULE | Freq: Two times a day (BID) | ORAL | Status: DC
  Administered 2019-03-17 – 2019-03-22 (×9): 200 mg via ORAL
  Filled 2019-03-17 (×9): qty 2

## 2019-03-17 MED ORDER — METHOCARBAMOL 500 MG PO TABS
500.0000 mg | ORAL_TABLET | Freq: Four times a day (QID) | ORAL | Status: DC | PRN
  Administered 2019-03-19 – 2019-03-22 (×6): 500 mg via ORAL
  Filled 2019-03-17 (×6): qty 1

## 2019-03-17 MED ORDER — METHOCARBAMOL 1000 MG/10ML IJ SOLN
500.0000 mg | Freq: Four times a day (QID) | INTRAVENOUS | Status: DC | PRN
  Filled 2019-03-17: qty 5

## 2019-03-17 MED ORDER — MUPIROCIN 2 % EX OINT
1.0000 "application " | TOPICAL_OINTMENT | Freq: Two times a day (BID) | CUTANEOUS | Status: DC
  Administered 2019-03-18 – 2019-03-22 (×9): 1 via NASAL
  Filled 2019-03-17: qty 22

## 2019-03-17 MED ORDER — MORPHINE SULFATE (PF) 2 MG/ML IV SOLN: 1.0000 mg | INTRAVENOUS | Status: DC | PRN

## 2019-03-17 MED ORDER — HYDROCODONE-ACETAMINOPHEN 5-325 MG PO TABS
1.0000 | ORAL_TABLET | Freq: Four times a day (QID) | ORAL | Status: DC | PRN
  Administered 2019-03-17: 2 via ORAL
  Filled 2019-03-17: qty 2

## 2019-03-17 MED ORDER — HEPARIN SODIUM (PORCINE) 5000 UNIT/ML IJ SOLN
5000.0000 [IU] | Freq: Three times a day (TID) | INTRAMUSCULAR | Status: DC
  Administered 2019-03-17: 5000 [IU] via SUBCUTANEOUS
  Filled 2019-03-17 (×2): qty 1

## 2019-03-17 MED ORDER — FENTANYL CITRATE (PF) 100 MCG/2ML IJ SOLN
50.0000 ug | INTRAMUSCULAR | Status: AC | PRN
  Administered 2019-03-17 (×2): 50 ug via INTRAVENOUS
  Filled 2019-03-17 (×2): qty 2

## 2019-03-17 MED ORDER — LUBIPROSTONE 24 MCG PO CAPS
24.0000 ug | ORAL_CAPSULE | Freq: Two times a day (BID) | ORAL | Status: DC
  Administered 2019-03-17 – 2019-03-22 (×9): 24 ug via ORAL
  Filled 2019-03-17 (×9): qty 1

## 2019-03-17 MED ORDER — SODIUM CHLORIDE 0.9 % IV SOLN
INTRAVENOUS | Status: DC
  Administered 2019-03-17: 18:00:00 via INTRAVENOUS

## 2019-03-17 MED ORDER — SODIUM CHLORIDE 0.9 % IV SOLN
INTRAVENOUS | Status: DC
  Administered 2019-03-17 – 2019-03-18 (×2): via INTRAVENOUS

## 2019-03-17 MED ORDER — MORPHINE SULFATE (PF) 2 MG/ML IV SOLN
1.0000 mg | INTRAVENOUS | Status: DC | PRN
  Administered 2019-03-17 – 2019-03-22 (×12): 1 mg via INTRAVENOUS
  Filled 2019-03-17 (×13): qty 1

## 2019-03-17 MED ORDER — ACETAMINOPHEN 500 MG PO TABS: 1000.0000 mg | ORAL_TABLET | Freq: Four times a day (QID) | ORAL | Status: DC | PRN

## 2019-03-17 MED ORDER — ONDANSETRON HCL 4 MG/2ML IJ SOLN
4.0000 mg | Freq: Once | INTRAMUSCULAR | Status: AC
  Administered 2019-03-17: 4 mg via INTRAVENOUS
  Filled 2019-03-17: qty 2

## 2019-03-17 NOTE — TOC Initial Note (Signed)
Transition of Care [facility]) - Initial/Assessment Note    Patient Details  Name: Betty Carpenter MRN: [id number] Date of Birth: 1925-08-08  Transition of [facility]) CM/SW Contact:    Naraya Stoneberg Dimitri Ped, LCSW Phone Number: 03/17/2019, 9:39 PM  Clinical Narrative:     CSW visited pt at bedside to address consult. CSW conducted [facility] assessment to assist with discharge planning.   Pt reported that prior to being admitted into the ED she resided at home with her husband. Pt added that she receives minimal assistance in a home setting and usually functions independently. Pt added that she already has a standard walker to assist with ambulation. Pt explained that today she had her first fall and is unsure about SNF referral.   CSW obtained permission from Pt to contact her daughter [name].   CSW contacted Pt's daughter ph: [phone]. Pt's daughter is receptive to SNF referral and is familiar with [facility] and [facility]. Enid Derry reports that Pt resides at a ALF [facility]) along with her husband. Pt has been residing at ALF for 6 years and lives only 1 mile away from her daughter.   CSW will continue to follow for D/C needs.  [place] Transitions of Care  Clinical Social Worker  Ph: [phone]  Expected Discharge Plan: Skilled Nursing Facility Barriers to Discharge: No Barriers Identified   Patient Goals and CMS Choice Patient states their goals for this hospitalization and ongoing recovery are:: Pt is unsure of her discharge goals but family is receptive to SNF recommendations       Expected Discharge Plan and Services Expected Discharge Plan: [place] In-house Referral: Clinical Social Work     Living arrangements for the past 2 months: [place]                                      Prior Living Arrangements/Services Living arrangements for the past 2 months: [place] Lives with:: Spouse Patient  language and need for interpreter reviewed:: Yes Do you feel safe going back to the place where you live?: Yes      Need for Family Participation in Patient Care: Yes (Comment) Care giver support system in place?: Yes (comment)   Criminal Activity/Legal Involvement Pertinent to Current Situation/Hospitalization: No - Comment as needed  Activities of Daily Living      Permission Sought/Granted Permission sought to share information with : Case Manager, Customer service manager, Family Supports Permission granted to share information with : Yes, Verbal Permission Granted  Share Information with NAME: [name]           Emotional Assessment Appearance:: Appears stated age Attitude/Demeanor/Rapport: Engaged Affect (typically observed): Accepting, Calm Orientation: : Oriented to Self, Oriented to  Time, Oriented to Place, Oriented to Situation   Psych Involvement: No (comment)  Admission diagnosis:  Fall Patient Active Problem List   Diagnosis Date Noted  . Closed right hip fracture ([place]) 03/17/2019  . Sinus tachycardia 03/17/2019  . History of breast cancer 10/23/2017  . History of DVT (deep vein thrombosis) 10/23/2017  . Chronic back pain 10/23/2017  . Benign essential HTN 10/23/2017  . Hypertensive urgency 10/23/2017  . Chronic, continuous use of opioids 10/23/2017  . Sepsis ([place]) 10/22/2017  . Breast cancer, stage 1 ([place]) 01/28/2012   PCP:  Dione Housekeeper, MD Pharmacy:  No Pharmacies Listed    Social  Determinants of Health (SDOH) Interventions    Readmission Risk Interventions No flowsheet data found.

## 2019-03-17 NOTE — ED Notes (Signed)
Reached Dr Aline Brochure by Cell to Dr Eulis Foster

## 2019-03-17 NOTE — ED Provider Notes (Signed)
[facility] EMERGENCY DEPARTMENT Provider Note   CSN: [id number] Arrival date & time: 03/17/19  1653    History   Chief Complaint Chief Complaint  Patient presents with  . Hip Pain    HPI Betty Carpenter is a 85 y.o. female.     HPI   Patient fell at her nursing care facility.  She is unsure what happened.  She is unable to give history.  Level 5 caveat-dementia  Past Medical History:  Diagnosis Date  . Arthritis   . Breast cancer ([place])   . Chronic back pain   . Closed wedge compression fracture of L1 vertebra ([place]) 2004  . Hypertension   . Sciatica     Patient Active Problem List   Diagnosis Date Noted  . History of breast cancer 10/23/2017  . History of DVT (deep vein thrombosis) 10/23/2017  . Chronic back pain 10/23/2017  . Benign essential HTN 10/23/2017  . Hypertensive urgency 10/23/2017  . Chronic, continuous use of opioids 10/23/2017  . Sepsis ([place]) 10/22/2017  . Breast cancer, stage 1 ([place]) 01/28/2012    Past Surgical History:  Procedure Laterality Date  . APPENDECTOMY       OB History   No obstetric history on file.      Home Medications    Prior to Admission medications   Medication Sig Start Date End Date Taking? Authorizing Provider  acetaminophen (TYLENOL) 500 MG tablet Take 1,000 mg by mouth every 6 (six) hours as needed for mild pain or headache.     [provider]  amLODipine (NORVASC) 5 MG tablet Take 1 tablet (5 mg total) by mouth 2 (two) times daily. Hold if SBP <150 and DBP <70 10/27/17   Theodis Blaze, MD  celecoxib (CELEBREX) 200 MG capsule Take 200 mg by mouth 2 (two) times daily.  10/06/17   [provider]  ciprofloxacin (CIPRO) 500 MG tablet Take 1 tablet (500 mg total) by mouth 2 (two) times daily. 10/27/17   Theodis Blaze, MD  LORazepam (ATIVAN) 0.5 MG tablet Take 1 tablet (0.5 mg total) by mouth 2 (two) times daily as needed for anxiety. 10/27/17   Theodis Blaze, MD  lubiprostone (AMITIZA) 24 MCG  capsule Take 24 mcg by mouth 2 (two) times daily.    [provider]  morphine (MS CONTIN) 30 MG 12 hr tablet Take 1 tablet (30 mg total) by mouth 2 (two) times daily. Hold if BP <110/60 10/27/17   Theodis Blaze, MD  ondansetron (ZOFRAN-ODT) 4 MG disintegrating tablet Take 4 mg by mouth every 6 (six) hours as needed for nausea or vomiting.  07/23/17   [provider]  tiZANidine (ZANAFLEX) 4 MG tablet Take 1 tablet (4 mg total) by mouth every 8 (eight) hours as needed for muscle spasms. 4mg  three times a day. Also 4mg  every 8 hours as needed for pain. 10/27/17   Theodis Blaze, MD  Vitamin D, Ergocalciferol, (DRISDOL) 50000 units CAPS capsule Take 50,000 Units by mouth See admin instructions. Twice a week. Days not specified on NH MAR. 08/21/16   [provider]  XARELTO 20 MG TABS tablet Take 20 mg by mouth daily.  10/06/17   [provider]    Family History Family History  Problem Relation Age of Onset  . Cancer Maternal Grandfather     Social History Social History   Tobacco Use  . Smoking status: Never Smoker  . Smokeless tobacco: Never Used  Substance Use Topics  .  Alcohol use: Not on file  . Drug use: Not on file     Allergies   Cymbalta [duloxetine hcl]   Review of Systems Review of Systems  Unable to perform ROS: Dementia     Physical Exam Updated Vital Signs BP (!) 165/108   Pulse 68   Temp 98.1 F (36.7 C)   Resp 17   Ht 5\' 10"  (1.778 m)   Wt 59 kg   SpO2 96%   BMI 18.65 kg/m   Physical Exam Vitals signs and nursing note reviewed.  Constitutional:      General: She is not in acute distress.    Appearance: Normal appearance. She is well-developed and normal weight. She is not ill-appearing or diaphoretic.  HENT:     Head: Normocephalic and atraumatic.     Right Ear: External ear normal.     Left Ear: External ear normal.  Eyes:     Conjunctiva/sclera: Conjunctivae normal.     Pupils: Pupils are equal, round, and  reactive to light.  Neck:     Musculoskeletal: Normal range of motion and neck supple.     Trachea: Phonation normal.  Cardiovascular:     Rate and Rhythm: Normal rate and regular rhythm.     Heart sounds: Normal heart sounds.  Pulmonary:     Effort: Pulmonary effort is normal.     Breath sounds: Normal breath sounds.  Abdominal:     Palpations: Abdomen is soft.     Tenderness: There is no abdominal tenderness.     Hernia: No hernia is present.  Musculoskeletal:     Comments: She guards against motion or palpation of the right hip.  No significant right leg shortening.  Lumbar and thoracic spine regions are nontender to palpation.  Skin:    General: Skin is warm and dry.  Neurological:     Mental Status: She is alert and oriented to person, place, and time.     Cranial Nerves: No cranial nerve deficit.     Sensory: No sensory deficit.     Motor: No abnormal muscle tone.     Coordination: Coordination normal.  Psychiatric:        Mood and Affect: Mood normal.        Behavior: Behavior normal.        Thought Content: Thought content normal.        Judgment: Judgment normal.      ED Treatments / Results  Labs (all labs ordered are listed, but only abnormal results are displayed) Labs Reviewed  BASIC METABOLIC PANEL - Abnormal; Notable for the following components:      Result Value   Glucose, Bld 116 (*)    All other components within normal limits  CBC WITH DIFFERENTIAL/PLATELET - Abnormal; Notable for the following components:   Lymphs Abs 0.5 (*)    All other components within normal limits  PROTIME-INR  TYPE AND SCREEN    EKG EKG Interpretation  Date/Time:  Thursday March 17 2019 18:49:54 EDT Ventricular Rate:  128 PR Interval:    QRS Duration: 91 QT Interval:  330 QTC Calculation: 482 R Axis:   18 Text Interpretation:  Sinus rhythm Sinus arrhythmia Probable anteroseptal infarct, old since last tracing no significant change Confirmed by Daleen Bo  [phone]) on 03/17/2019 8:10:43 PM   Radiology Dg Hip Unilat With Pelvis 2-3 Views Right  Result Date: 03/17/2019 CLINICAL DATA:  [place] today with right hip pain and external rotation. EXAM: DG HIP (WITH OR WITHOUT  PELVIS) 2-3V RIGHT COMPARISON:  None. FINDINGS: Impacted femoral neck fracture. No intertrochanteric component seen. Other bones of the pelvis appear negative. IMPRESSION: Impacted femoral neck fracture on the right. Electronically Signed   By: Nelson Chimes M.D.   On: 03/17/2019 18:39    Procedures Procedures (including critical care time)  Medications Ordered in ED Medications  0.9 %  sodium chloride infusion ( Intravenous New Bag/Given 03/17/19 1742)  fentaNYL (SUBLIMAZE) injection 50 mcg (50 mcg Intravenous Given 03/17/19 1919)  ondansetron (ZOFRAN) injection 4 mg (4 mg Intravenous Given 03/17/19 1741)     Initial Impression / Assessment and Plan / ED Course  I have reviewed the triage vital signs and the nursing notes.  Pertinent labs & imaging results that were available during my care of the patient were reviewed by me and considered in my medical decision making (see chart for details).  Clinical Course as of Mar 16 2010  Thu Mar 17, 2019  2007 Case discussed with orthopedic surgery, who will see the patient has a Optometrist and possibly manage nonoperatively.   [EW]  2007 Normal   [EW]  2008 Normal  Basic metabolic panel(!) [EW]  5035 Right hip fracture, femoral neck, impacted.  Images reviewed by me  DG Hip Unilat With Pelvis 2-3 Views Right [EW]    Clinical Course User Index [EW] Daleen Bo, MD        Patient Vitals for the past 24 hrs:  BP Temp Pulse Resp SpO2 Height Weight  03/17/19 1909 (!) 165/108 - - 17 96 % - -  03/17/19 1900 (!) 160/102 - - 12 - - -  03/17/19 1830 (!) 146/83 - - - 94 % - -  03/17/19 1746 (!) 146/92 - 68 16 96 % - -  03/17/19 1700 (!) 175/87 98.1 F (36.7 C) 70 19 100 % - -  03/17/19 1658 - - - - - 5\' 10"  (1.778 m) 59 kg     8:07 PM Reevaluation with update and discussion. After initial assessment and treatment, an updated evaluation reveals no change in clinical status.  I have attempted to contact the patient's listed contact, there is no answer and no forwarding information. Daleen Bo   Medical Decision Making: Fall with isolated injury, right hip, impacted fracture.  I suspect mechanical fall.  Screening evaluation is reassuring.  CRITICAL CARE- No Performed by: Daleen Bo  Nursing Notes Reviewed/ Care Coordinated Applicable Imaging Reviewed Interpretation of Laboratory Data incorporated into ED treatment   8:11 PM-Consult complete with hospitalist. Patient case explained and discussed.  He agrees to admit patient for further evaluation and treatment. Call ended at 8:18 PM  Plan: Admit  Final Clinical Impressions(s) / ED Diagnoses   Final diagnoses:  S/P right hip fracture  Fall, initial encounter    ED Discharge Orders    None       Daleen Bo, MD 03/18/19 1106

## 2019-03-17 NOTE — H&P (Signed)
TRH H&P   Patient Demographics:    Betty Carpenter, is a 85 y.o. female  MRN: [id number]   DOB - October 27, 1925  Admit Date - 03/17/2019  Outpatient Primary MD for the patient is Dione Housekeeper, MD  Referring MD/NP/PA: Dr. Eulis Foster  Patient coming from: [facility] [place]  Chief Complaint  Patient presents with  . Hip Pain      HPI:    Betty Carpenter  is a 85 y.o. female, with medical history significant ofHTN, chronic back pain, chronic opioid use, history of DVT used to be on Xarelto (stopped 1.5 years ago), and breast cancers/pmastectomy, patient was sent from her facility secondary to mechanical fall, and right hip pain, patient cannot recall exact events, but usually she ambulates with a cane, remember she was walking without her cane, and she does report mechanical fall while walking outside, she denies any dizziness, lightheadedness, syncope, loss of consciousness or any focal deficits, tingling numbness, she reports immediate right hip pain after her fall and transfer to ED. denies any fever, chest pain, shortness of breath, dysuria, polyuria. - in ED hip x-ray significant for right hip fracture, significant lab abnormality, she was noted to have sinus tachycardia secondary to significant pain, orthopedic was consulted by ED, there was called to admit.    Review of systems:    In addition to the HPI above, No Fever-chills, No Headache, No changes with Vision or hearing, No problems swallowing food or Liquids, No Chest pain, Cough or Shortness of Breath, No Abdominal pain, No Nausea or Vommitting, Bowel movements are regular, No Blood in stool or Urine, No dysuria, No new skin rashes or bruises, Complains of lower back pain, right hip pain No new weakness, tingling, numbness in any extremity, No recent weight gain or loss, No polyuria, polydypsia or polyphagia, No significant  Mental Stressors.  A full 10 point Review of Systems was done, except as stated above, all other Review of Systems were negative.   With Past History of the following :    Past Medical History:  Diagnosis Date  . Arthritis   . Breast cancer ([place])   . Chronic back pain   . Closed wedge compression fracture of L1 vertebra ([place]) 2004  . Hypertension   . Sciatica       Past Surgical History:  Procedure Laterality Date  . APPENDECTOMY        Social History:     Social History   Tobacco Use  . Smoking status: Never Smoker  . Smokeless tobacco: Never Used  Substance Use Topics  . Alcohol use: Not on file     Lives -at SNF  Mobility -with cane     Family History :     Family History  Problem Relation Age of Onset  . Cancer Maternal Grandfather     Home Medications:   Prior to Admission medications  Medication Sig Start Date End Date Taking? Authorizing Provider  acetaminophen (TYLENOL) 500 MG tablet Take 1,000 mg by mouth every 6 (six) hours as needed for mild pain or headache.     [provider]  amLODipine (NORVASC) 5 MG tablet Take 1 tablet (5 mg total) by mouth 2 (two) times daily. Hold if SBP <150 and DBP <70 10/27/17   Theodis Blaze, MD  celecoxib (CELEBREX) 200 MG capsule Take 200 mg by mouth 2 (two) times daily.  10/06/17   [provider]  ciprofloxacin (CIPRO) 500 MG tablet Take 1 tablet (500 mg total) by mouth 2 (two) times daily. 10/27/17   Theodis Blaze, MD  LORazepam (ATIVAN) 0.5 MG tablet Take 1 tablet (0.5 mg total) by mouth 2 (two) times daily as needed for anxiety. 10/27/17   Theodis Blaze, MD  lubiprostone (AMITIZA) 24 MCG capsule Take 24 mcg by mouth 2 (two) times daily.    [provider]  morphine (MS CONTIN) 30 MG 12 hr tablet Take 1 tablet (30 mg total) by mouth 2 (two) times daily. Hold if BP <110/60 10/27/17   Theodis Blaze, MD  ondansetron (ZOFRAN-ODT) 4 MG disintegrating tablet Take 4 mg by mouth every 6  (six) hours as needed for nausea or vomiting.  07/23/17   [provider]  tiZANidine (ZANAFLEX) 4 MG tablet Take 1 tablet (4 mg total) by mouth every 8 (eight) hours as needed for muscle spasms. 4mg  three times a day. Also 4mg  every 8 hours as needed for pain. 10/27/17   Theodis Blaze, MD  Vitamin D, Ergocalciferol, (DRISDOL) 50000 units CAPS capsule Take 50,000 Units by mouth See admin instructions. Twice a week. Days not specified on NH MAR. 08/21/16   [provider]  XARELTO 20 MG TABS tablet Take 20 mg by mouth daily.  10/06/17   [provider]     Allergies:     Allergies  Allergen Reactions  . Cymbalta [Duloxetine Hcl]     " couldn't make water"- unable to urinate     Physical Exam:   Vitals  Blood pressure (!) 159/94, pulse (!) 120, temperature 98.1 F (36.7 C), resp. rate 16, height 5\' 10"  (1.778 m), weight 59 kg, SpO2 93 %.   1. General, extremely thin appearing female, in significant discomfort secondary to lower back and right hip pain  2. Normal affect and insight, Not Suicidal or Homicidal, Awake Alert, Oriented X 3.  3. No F.N deficits, ALL C.Nerves Intact, Strength 5/5 all 4 extremities, Sensation intact all 4 extremities, Plantars down going.  4. Ears and Eyes appear Normal, Conjunctivae clear, PERRLA. Moist Oral Mucosa.  5. Supple Neck, No JVD, No cervical lymphadenopathy appriciated, No Carotid Bruits.  6. Symmetrical Chest wall movement, Good air movement bilaterally, CTAB.  7.  Tachycardic, No Gallops, Rubs or Murmurs, No Parasternal Heave.  8. Positive Bowel Sounds, Abdomen Soft, No tenderness, No organomegaly appriciated,No rebound -guarding or rigidity.  9.  No Cyanosis, Normal Skin Turgor, laceration and left elbow.  10. Good muscle tone, lower extremity shortened and externally rotated and of motion limited secondary to pain 11. No Palpable Lymph Nodes in Neck or Axillae    Data Review:    CBC Recent Labs  Lab  03/17/19 1724  WBC 5.1  HGB 12.0  HCT 38.5  PLT 189  MCV 93.2  MCH 29.1  MCHC 31.2  RDW 13.6  LYMPHSABS 0.5*  MONOABS 0.4  EOSABS 0.0  BASOSABS 0.0   ------------------------------------------------------------------------------------------------------------------  Chemistries  Recent Labs  Lab 03/17/19 1724  NA 139  K 3.8  CL 102  CO2 26  GLUCOSE 116*  BUN 21  CREATININE 0.83  CALCIUM 9.0   ------------------------------------------------------------------------------------------------------------------ estimated creatinine clearance is 41.1 mL/min (by C-G formula based on SCr of 0.83 mg/dL). ------------------------------------------------------------------------------------------------------------------ No results for input(s): TSH, T4TOTAL, T3FREE, THYROIDAB in the last 72 hours.  Invalid input(s): FREET3  Coagulation profile Recent Labs  Lab 03/17/19 1724  INR 1.0   ------------------------------------------------------------------------------------------------------------------- No results for input(s): DDIMER in the last 72 hours. -------------------------------------------------------------------------------------------------------------------  Cardiac Enzymes No results for input(s): CKMB, TROPONINI, MYOGLOBIN in the last 168 hours.  Invalid input(s): CK ------------------------------------------------------------------------------------------------------------------ No results found for: BNP   ---------------------------------------------------------------------------------------------------------------  Urinalysis    Component Value Date/Time   COLORURINE STRAW (A) 02/26/2018 0832   APPEARANCEUR CLEAR 02/26/2018 0832   LABSPEC 1.008 02/26/2018 0832   PHURINE 8.0 02/26/2018 0832   GLUCOSEU NEGATIVE 02/26/2018 0832   HGBUR SMALL (A) 02/26/2018 0832   BILIRUBINUR NEGATIVE 02/26/2018 0832   KETONESUR 5 (A) 02/26/2018 0832   PROTEINUR NEGATIVE  02/26/2018 0832   NITRITE NEGATIVE 02/26/2018 0832   LEUKOCYTESUR NEGATIVE 02/26/2018 0832    ----------------------------------------------------------------------------------------------------------------   Imaging Results:    Dg Hip Unilat With Pelvis 2-3 Views Right  Result Date: 03/17/2019 CLINICAL DATA:  [place] today with right hip pain and external rotation. EXAM: DG HIP (WITH OR WITHOUT PELVIS) 2-3V RIGHT COMPARISON:  None. FINDINGS: Impacted femoral neck fracture. No intertrochanteric component seen. Other bones of the pelvis appear negative. IMPRESSION: Impacted femoral neck fracture on the right. Electronically Signed   By: Nelson Chimes M.D.   On: 03/17/2019 18:39    My personal review of EKG: Rhythm NSR, Rate  123 /min, QTc 482 , no Acute ST changes   Assessment & Plan:    Active Problems:   Breast cancer, stage 1 (HCC)   History of DVT (deep vein thrombosis)   Chronic back pain   Benign essential HTN   Chronic, continuous use of opioids   Closed right hip fracture (HCC)   Sinus tachycardia  Right hip fracture -Secondary to mechanical fall, orthopedic consulted by ED, patient will be admitted for hip fracture protocol, continue with PRN pain medicines, bowel regimen, Dr. Aline Brochure to decide tomorrow surgery is indicated, patient denies any chest pain or shortness of breath at baseline, no significant labs abnormalities, at this point there is no indication for further work-up before proceeding with surgery. -Start on subcu heparin for DVT prophylaxis given her history of DVT in the past(patient has stopped Xarelto 1.5 years ago)  Sinus tachycardia -Secondary to pain, she looks uncomfortable, will adjust pain regimen, and monitoring on telemetry  Hypertension -Resume home amlodipine  Neck back pain/chronic, continuous use of opioids -Resume home medication, as well on PRN meds during hospital stay for her hip fracture  History of DVT -He has stopped Xarelto 1.5 years  ago, only single episode, will continue with SCD and subcu heparin during hospital stay, likely will need Lovenox on discharge for DVT prophylaxis  Protein calorie malnutrition -BMI of 18, will consult nutritionist  DVT Prophylaxis Heparin -   SCDs  AM Labs Ordered, also please review Full Orders  Family Communication: Admission, patients condition and plan of care including tests being ordered have been discussed with the patient and daughter via phone [phone]) who indicate understanding and agree with the plan and Code Status.  Code Status Full code  Likely DC to  SNF  Condition GUARDED  Consults called: Ortho by ED  Admission status: Inpatient  Time spent in minutes : 50 MINUTES   Phillips Climes M.D on 03/17/2019 at 8:48 PM  Between 7am to 7pm - Pager - [phone]. After 7pm go to [email or web address] - password [facility]  [facility] - Office  [phone]

## 2019-03-17 NOTE — ED Notes (Signed)
Repaged Dr Aline Brochure to Dr Eulis Foster @ [facility].

## 2019-03-17 NOTE — Progress Notes (Signed)
Patient ID: Betty Carpenter, female   DOB: July 28, 1925, 85 y.o.   MRN: [id number]  85 yo female subcapital fracture   Plan orif if family agrees  Has type/screeen. Low blood loss expected  CBC Latest Ref Rng & Units 03/17/2019 10/27/2017 10/26/2017  WBC 4.0 - 10.5 K/uL 5.1 6.5 7.0  Hemoglobin 12.0 - 15.0 g/dL 12.0 11.3(L) 11.4(L)  Hematocrit 36.0 - 46.0 % 38.5 34.6(L) 34.8(L)  Platelets 150 - 400 K/uL 189 160 136(L)    BMP Latest Ref Rng & Units 03/17/2019 10/27/2017 10/26/2017  Glucose 70 - 99 mg/dL 116(H) 103(H) 99  BUN 8 - 23 mg/dL 21 22(H) 20  Creatinine 0.44 - 1.00 mg/dL 0.83 0.77 0.73  Sodium 135 - 145 mmol/L 139 137 136  Potassium 3.5 - 5.1 mmol/L 3.8 3.9 3.9  Chloride 98 - 111 mmol/L 102 101 105  CO2 22 - 32 mmol/L 26 29 26   Calcium 8.9 - 10.3 mg/dL 9.0 8.7(L) 8.4(L)

## 2019-03-17 NOTE — Progress Notes (Signed)
Consult request has been received. CSW attempting to follow up at present time  Jake Church, [place] Social Worker

## 2019-03-17 NOTE — ED Triage Notes (Signed)
Pt from [facility] in [place].  Pt fell in parking lot and now c/o of back, right arm and right leg pain.

## 2019-03-17 NOTE — ED Notes (Signed)
PLEASE CALL PATIENT'S Betty Carpenter MYTR [phone]  With updates on Mother's condition

## 2019-03-18 ENCOUNTER — Other Ambulatory Visit: Payer: Self-pay

## 2019-03-18 ENCOUNTER — Encounter (HOSPITAL_COMMUNITY)
Admission: EM | Disposition: A | Discharge status: Skilled Nursing Facility | Payer: Self-pay | Source: Skilled Nursing Facility | Attending: Internal Medicine

## 2019-03-18 ENCOUNTER — Inpatient Hospital Stay (HOSPITAL_COMMUNITY): Payer: Medicare Other

## 2019-03-18 ENCOUNTER — Inpatient Hospital Stay (HOSPITAL_COMMUNITY): Payer: Medicare Other | Admitting: Anesthesiology

## 2019-03-18 ENCOUNTER — Encounter (HOSPITAL_COMMUNITY): Payer: Self-pay

## 2019-03-18 DIAGNOSIS — F1190 Opioid use, unspecified, uncomplicated: Secondary | ICD-10-CM

## 2019-03-18 DIAGNOSIS — S72001D Fracture of unspecified part of neck of right femur, subsequent encounter for closed fracture with routine healing: Secondary | ICD-10-CM

## 2019-03-18 DIAGNOSIS — L8990 Pressure ulcer of unspecified site, unspecified stage: Secondary | ICD-10-CM

## 2019-03-18 DIAGNOSIS — C50919 Malignant neoplasm of unspecified site of unspecified female breast: Secondary | ICD-10-CM

## 2019-03-18 DIAGNOSIS — S72001A Fracture of unspecified part of neck of right femur, initial encounter for closed fracture: Secondary | ICD-10-CM

## 2019-03-18 HISTORY — PX: HIP PINNING,CANNULATED: SHX1758

## 2019-03-18 LAB — BASIC METABOLIC PANEL
Anion gap: 6 (ref 5–15)
BUN: 17 mg/dL (ref 8–23)
CO2: 27 mmol/L (ref 22–32)
Calcium: 8.6 mg/dL — ABNORMAL LOW (ref 8.9–10.3)
Chloride: 107 mmol/L (ref 98–111)
Creatinine, Ser: 0.65 mg/dL (ref 0.44–1.00)
GFR calc Af Amer: >60 mL/min (ref >60)
GFR calc non Af Amer: >60 mL/min (ref >60)
Glucose, Bld: 106 mg/dL — ABNORMAL HIGH (ref 70–99)
Potassium: 3.5 mmol/L (ref 3.5–5.1)
Sodium: 140 mmol/L (ref 135–145)

## 2019-03-18 LAB — CBC
HCT: 34.7 % — ABNORMAL LOW (ref 36.0–46.0)
Hemoglobin: 11.1 g/dL — ABNORMAL LOW (ref 12.0–15.0)
MCH: 29.1 pg (ref 26.0–34.0)
MCHC: 32 g/dL (ref 30.0–36.0)
MCV: 90.8 fL (ref 80.0–100.0)
Platelets: 167 10*3/uL (ref 150–400)
RBC: 3.82 MIL/uL — ABNORMAL LOW (ref 3.87–5.11)
RDW: 13.7 % (ref 11.5–15.5)
WBC: 4.8 10*3/uL (ref 4.0–10.5)
nRBC: 0 % (ref 0.0–0.2)

## 2019-03-18 LAB — SURGICAL PCR SCREEN
MRSA, PCR: NEGATIVE
Staphylococcus aureus: POSITIVE — AB

## 2019-03-18 SURGERY — FIXATION, FEMUR, NECK, PERCUTANEOUS, USING SCREW
Anesthesia: General | Site: Hip | Laterality: Right

## 2019-03-18 MED ORDER — SUCCINYLCHOLINE CHLORIDE 20 MG/ML IJ SOLN
INTRAMUSCULAR | Status: DC | PRN
  Administered 2019-03-18: 100 mg via INTRAVENOUS

## 2019-03-18 MED ORDER — SUCCINYLCHOLINE CHLORIDE 200 MG/10ML IV SOSY
PREFILLED_SYRINGE | INTRAVENOUS | Status: AC
  Filled 2019-03-18: qty 10

## 2019-03-18 MED ORDER — FENTANYL CITRATE (PF) 100 MCG/2ML IJ SOLN
INTRAMUSCULAR | Status: AC
  Filled 2019-03-18: qty 2

## 2019-03-18 MED ORDER — POVIDONE-IODINE 10 % EX SWAB: 2.0000 "application " | Freq: Once | CUTANEOUS | Status: DC

## 2019-03-18 MED ORDER — METOCLOPRAMIDE HCL 10 MG PO TABS: 5.0000 mg | ORAL_TABLET | Freq: Three times a day (TID) | ORAL | Status: DC | PRN

## 2019-03-18 MED ORDER — PROPOFOL 10 MG/ML IV BOLUS
INTRAVENOUS | Status: DC | PRN
  Administered 2019-03-18: 50 mg via INTRAVENOUS

## 2019-03-18 MED ORDER — POLYETHYLENE GLYCOL 3350 17 G PO PACK: 17.0000 g | PACK | Freq: Every day | ORAL | Status: DC | PRN

## 2019-03-18 MED ORDER — ONDANSETRON HCL 4 MG PO TABS: 4.0000 mg | ORAL_TABLET | Freq: Four times a day (QID) | ORAL | Status: DC | PRN

## 2019-03-18 MED ORDER — ALBUTEROL SULFATE HFA 108 (90 BASE) MCG/ACT IN AERS
INHALATION_SPRAY | RESPIRATORY_TRACT | Status: DC | PRN
  Administered 2019-03-18: 2 via RESPIRATORY_TRACT

## 2019-03-18 MED ORDER — CEFAZOLIN SODIUM-DEXTROSE 2-4 GM/100ML-% IV SOLN
2.0000 g | INTRAVENOUS | Status: AC
  Administered 2019-03-18: 2 g via INTRAVENOUS

## 2019-03-18 MED ORDER — METOPROLOL TARTRATE 5 MG/5ML IV SOLN
INTRAVENOUS | Status: DC | PRN
  Administered 2019-03-18 (×2): 2.5 mg via INTRAVENOUS

## 2019-03-18 MED ORDER — METOPROLOL TARTRATE 5 MG/5ML IV SOLN
INTRAVENOUS | Status: AC
  Filled 2019-03-18: qty 5

## 2019-03-18 MED ORDER — CHLORHEXIDINE GLUCONATE 4 % EX LIQD
60.0000 mL | Freq: Once | CUTANEOUS | Status: AC
  Administered 2019-03-18: 4 via TOPICAL
  Filled 2019-03-18: qty 60

## 2019-03-18 MED ORDER — CHLORHEXIDINE GLUCONATE CLOTH 2 % EX PADS
6.0000 | MEDICATED_PAD | Freq: Every day | CUTANEOUS | Status: AC
  Administered 2019-03-18 – 2019-03-22 (×5): 6 via TOPICAL

## 2019-03-18 MED ORDER — ONDANSETRON HCL 4 MG/2ML IJ SOLN: 4.0000 mg | Freq: Four times a day (QID) | INTRAMUSCULAR | Status: DC | PRN

## 2019-03-18 MED ORDER — ENSURE ENLIVE PO LIQD
237.0000 mL | Freq: Two times a day (BID) | ORAL | Status: DC
  Administered 2019-03-18 – 2019-03-22 (×8): 237 mL via ORAL

## 2019-03-18 MED ORDER — BUPIVACAINE-EPINEPHRINE (PF) 0.5% -1:200000 IJ SOLN
INTRAMUSCULAR | Status: AC
  Filled 2019-03-18: qty 60

## 2019-03-18 MED ORDER — DEXAMETHASONE SODIUM PHOSPHATE 10 MG/ML IJ SOLN
INTRAMUSCULAR | Status: AC
  Filled 2019-03-18: qty 1

## 2019-03-18 MED ORDER — SUGAMMADEX SODIUM 500 MG/5ML IV SOLN
INTRAVENOUS | Status: AC
  Filled 2019-03-18: qty 10

## 2019-03-18 MED ORDER — CEFAZOLIN SODIUM-DEXTROSE 2-4 GM/100ML-% IV SOLN
INTRAVENOUS | Status: AC
  Filled 2019-03-18: qty 100

## 2019-03-18 MED ORDER — CEFAZOLIN SODIUM-DEXTROSE 2-4 GM/100ML-% IV SOLN
2.0000 g | Freq: Four times a day (QID) | INTRAVENOUS | Status: AC
  Administered 2019-03-18 (×2): 2 g via INTRAVENOUS
  Filled 2019-03-18 (×2): qty 100

## 2019-03-18 MED ORDER — MENTHOL 3 MG MT LOZG: 1.0000 | LOZENGE | OROMUCOSAL | Status: DC | PRN

## 2019-03-18 MED ORDER — HYDROMORPHONE HCL 1 MG/ML IJ SOLN
0.2500 mg | INTRAMUSCULAR | Status: DC | PRN
  Administered 2019-03-18: 0.5 mg via INTRAVENOUS
  Filled 2019-03-18: qty 0.5

## 2019-03-18 MED ORDER — ASPIRIN EC 325 MG PO TBEC
325.0000 mg | DELAYED_RELEASE_TABLET | Freq: Every day | ORAL | Status: DC
  Administered 2019-03-19: 10:00:00 325 mg via ORAL
  Filled 2019-03-18: qty 1

## 2019-03-18 MED ORDER — 0.9 % SODIUM CHLORIDE (POUR BTL) OPTIME
TOPICAL | Status: DC | PRN
  Administered 2019-03-18: 1000 mL

## 2019-03-18 MED ORDER — PROPOFOL 10 MG/ML IV BOLUS
INTRAVENOUS | Status: AC
  Filled 2019-03-18: qty 20

## 2019-03-18 MED ORDER — FENTANYL CITRATE (PF) 100 MCG/2ML IJ SOLN
INTRAMUSCULAR | Status: DC | PRN
  Administered 2019-03-18 (×2): 25 ug via INTRAVENOUS

## 2019-03-18 MED ORDER — ROCURONIUM BROMIDE 50 MG/5ML IV SOSY
PREFILLED_SYRINGE | INTRAVENOUS | Status: DC | PRN
  Administered 2019-03-18: 25 mg via INTRAVENOUS

## 2019-03-18 MED ORDER — PROMETHAZINE HCL 25 MG/ML IJ SOLN: 6.2500 mg | INTRAMUSCULAR | Status: DC | PRN

## 2019-03-18 MED ORDER — DOCUSATE SODIUM 100 MG PO CAPS: 100.0000 mg | ORAL_CAPSULE | Freq: Two times a day (BID) | ORAL | Status: DC

## 2019-03-18 MED ORDER — ALBUTEROL SULFATE HFA 108 (90 BASE) MCG/ACT IN AERS
INHALATION_SPRAY | RESPIRATORY_TRACT | Status: AC
  Filled 2019-03-18: qty 6.7

## 2019-03-18 MED ORDER — ONDANSETRON HCL 4 MG/2ML IJ SOLN
INTRAMUSCULAR | Status: AC
  Filled 2019-03-18: qty 2

## 2019-03-18 MED ORDER — PROPOFOL 10 MG/ML IV BOLUS: INTRAVENOUS | Status: DC | PRN

## 2019-03-18 MED ORDER — METOCLOPRAMIDE HCL 5 MG/ML IJ SOLN: 5.0000 mg | Freq: Three times a day (TID) | INTRAMUSCULAR | Status: DC | PRN

## 2019-03-18 MED ORDER — BUPIVACAINE-EPINEPHRINE (PF) 0.5% -1:200000 IJ SOLN
INTRAMUSCULAR | Status: DC | PRN
  Administered 2019-03-18: 60 mL

## 2019-03-18 MED ORDER — PHENOL 1.4 % MT LIQD: 1.0000 | OROMUCOSAL | Status: DC | PRN

## 2019-03-18 MED ORDER — ROCURONIUM BROMIDE 10 MG/ML (PF) SYRINGE
PREFILLED_SYRINGE | INTRAVENOUS | Status: AC
  Filled 2019-03-18: qty 10

## 2019-03-18 MED ORDER — SODIUM CHLORIDE 0.9 % IV SOLN
INTRAVENOUS | Status: DC
  Administered 2019-03-18 – 2019-03-21 (×4): via INTRAVENOUS

## 2019-03-18 MED ORDER — LACTATED RINGERS IV SOLN
INTRAVENOUS | Status: DC
  Administered 2019-03-18 (×2): via INTRAVENOUS

## 2019-03-18 MED ORDER — ADULT MULTIVITAMIN LIQUID CH
15.0000 mL | Freq: Every day | ORAL | Status: DC
  Administered 2019-03-18 – 2019-03-21 (×4): 15 mL via ORAL
  Filled 2019-03-18 (×4): qty 15

## 2019-03-18 SURGICAL SUPPLY — 54 items
APL PRP STRL LF DISP 70% ISPRP (MISCELLANEOUS) ×1
BIT DRILL 5 ACE CANN QC (BIT) ×3 IMPLANT
BLADE HEX COATED 2.75 (ELECTRODE) ×3 IMPLANT
BLADE SURG SZ10 CARB STEEL (BLADE) ×6 IMPLANT
BNDG GAUZE ELAST 4 BULKY (GAUZE/BANDAGES/DRESSINGS) ×3 IMPLANT
CHLORAPREP W/TINT 26 (MISCELLANEOUS) ×3 IMPLANT
CLOTH BEACON ORANGE TIMEOUT ST (SAFETY) ×3 IMPLANT
COVER LIGHT HANDLE STERIS (MISCELLANEOUS) ×12 IMPLANT
COVER MAYO STAND XLG (DRAPE) IMPLANT
COVER WAND RF STERILE (DRAPES) ×3 IMPLANT
DECANTER SPIKE VIAL GLASS SM (MISCELLANEOUS) ×6 IMPLANT
DRAPE STERI IOBAN 125X83 (DRAPES) ×3 IMPLANT
DRESSING MEPILEX BORDER 6X8 (GAUZE/BANDAGES/DRESSINGS) ×1 IMPLANT
DRSG MEPILEX BORDER 4X8 (GAUZE/BANDAGES/DRESSINGS) ×2 IMPLANT
DRSG MEPILEX BORDER 6X8 (GAUZE/BANDAGES/DRESSINGS) ×3
GLOVE BIOGEL PI IND STRL 7.0 (GLOVE) ×2 IMPLANT
GLOVE BIOGEL PI INDICATOR 7.0 (GLOVE) ×4
GLOVE SKINSENSE NS SZ8.0 LF (GLOVE) ×4
GLOVE SKINSENSE STRL SZ8.0 LF (GLOVE) ×2 IMPLANT
GLOVE SS N UNI LF 8.5 STRL (GLOVE) ×3 IMPLANT
GOWN STRL REUS W/TWL LRG LVL3 (GOWN DISPOSABLE) ×3 IMPLANT
GOWN STRL REUS W/TWL XL LVL3 (GOWN DISPOSABLE) ×3 IMPLANT
INST SET MAJOR BONE (KITS) ×3 IMPLANT
KIT BLADEGUARD II DBL (SET/KITS/TRAYS/PACK) ×3 IMPLANT
KIT TURNOVER CYSTO (KITS) ×3 IMPLANT
MANIFOLD NEPTUNE II (INSTRUMENTS) ×3 IMPLANT
MARKER SKIN DUAL TIP RULER LAB (MISCELLANEOUS) ×3 IMPLANT
NDL HYPO 21X1.5 SAFETY (NEEDLE) ×1 IMPLANT
NDL SPNL 18GX3.5 QUINCKE PK (NEEDLE) ×1 IMPLANT
NEEDLE HYPO 21X1.5 SAFETY (NEEDLE) ×3 IMPLANT
NEEDLE SPNL 18GX3.5 QUINCKE PK (NEEDLE) ×3 IMPLANT
NS IRRIG 1000ML POUR BTL (IV SOLUTION) ×3 IMPLANT
PACK BASIC III (CUSTOM PROCEDURE TRAY) ×3
PACK SRG BSC III STRL LF ECLPS (CUSTOM PROCEDURE TRAY) ×1 IMPLANT
PAD ABD 5X9 TENDERSORB (GAUZE/BANDAGES/DRESSINGS) ×3 IMPLANT
PENCIL HANDSWITCHING (ELECTRODE) ×3 IMPLANT
PIN THREADED GUIDE ACE (PIN) ×9 IMPLANT
SCREW CANN 6.5 75MM (Screw) ×3 IMPLANT
SCREW CANN 6.5 80MM (Screw) ×3 IMPLANT
SCREW CANN 6.5 85MM (Screw) ×3 IMPLANT
SCREW CANN LG 6.5 FLT 75X22 (Screw) IMPLANT
SCREW CANN LG 6.5 FLT 80X22 (Screw) IMPLANT
SCREW CANN LG 6.5 FLT 85X22 (Screw) IMPLANT
SET BASIN LINEN APH (SET/KITS/TRAYS/PACK) ×3 IMPLANT
SPONGE LAP 18X18 RF (DISPOSABLE) ×3 IMPLANT
STAPLER VISISTAT 35W (STAPLE) ×3 IMPLANT
SUT BRALON NAB BRD #1 30IN (SUTURE) IMPLANT
SUT MNCRL 0 VIOLET CTX 36 (SUTURE) ×1 IMPLANT
SUT MON AB 2-0 CT1 36 (SUTURE) ×3 IMPLANT
SUT MONOCRYL 0 CTX 36 (SUTURE) ×4
SYR 30ML LL (SYRINGE) ×3 IMPLANT
SYR BULB IRRIGATION 50ML (SYRINGE) ×6 IMPLANT
WASHER ACECAN 6.5 (Washer) ×6 IMPLANT
YANKAUER SUCT BULB TIP 10FT TU (MISCELLANEOUS) ×3 IMPLANT

## 2019-03-18 NOTE — H&P (View-Only) (Signed)
HOSPITAL CONSULT (HIP FRACTURE )    Patient ID: Betty Carpenter, female   DOB: [birth year], 85 y.o.   MRN: [id number]  New patient  Requested by:  Reason for:   Chief Complaint  Patient presents with  . Hip Pain   Right//hip pain//   Betty Carpenter is a 85 y.o. female.    HPI  85 year old female at nursing home and [place] walks with a walker fell on March 17, 2019 injured her right hip presented to the ER with an externally rotated right lower extremity complaining of right hip pain and difficulty standing and ambulating  No pain at rest pain with motion and palpation Quality of pain dull  Review of Systems (all) Review of Systems  Unable to perform ROS: Acuity of condition    Past Medical History:  Diagnosis Date  . Arthritis   . Breast cancer ([place])   . Chronic back pain   . Closed wedge compression fracture of L1 vertebra ([place]) 2004  . Hypertension   . Sciatica      Past Surgical History:  Procedure Laterality Date  . APPENDECTOMY      Family History  Problem Relation Age of Onset  . Cancer Maternal Grandfather     Social History Social History   Tobacco Use  . Smoking status: Never Smoker  . Smokeless tobacco: Never Used  Substance Use Topics  . Alcohol use: Not on file  . Drug use: Not on file    Allergies  Allergen Reactions  . Cymbalta [Duloxetine Hcl]     " couldn't make water"- unable to urinate    Current Facility-Administered Medications  Medication Dose Route Frequency Provider Last Rate Last Dose  . 0.9 %  sodium chloride infusion   Intravenous Continuous Elgergawy, Silver Huguenin, MD 50 mL/hr at 03/18/19 0527    . acetaminophen (TYLENOL) tablet 1,000 mg  1,000 mg Oral Q6H PRN Elgergawy, Silver Huguenin, MD      . amLODipine (NORVASC) tablet 5 mg  5 mg Oral BID Elgergawy, Silver Huguenin, MD   5 mg at 03/17/19 2226  . celecoxib (CELEBREX) capsule 200 mg  200 mg Oral BID Elgergawy, Silver Huguenin, MD   200 mg at 03/17/19 2226  . Chlorhexidine Gluconate Cloth 2 %  PADS 6 each  6 each Topical Daily Elgergawy, Silver Huguenin, MD      . docusate sodium (COLACE) capsule 100 mg  100 mg Oral BID Elgergawy, Silver Huguenin, MD   100 mg at 03/17/19 2226  . heparin injection 5,000 Units  5,000 Units Subcutaneous Q8H Elgergawy, Silver Huguenin, MD   5,000 Units at 03/17/19 2227  . HYDROcodone-acetaminophen (NORCO/VICODIN) 5-325 MG per tablet 1-2 tablet  1-2 tablet Oral Q6H PRN Elgergawy, Silver Huguenin, MD   2 tablet at 03/17/19 2226  . LORazepam (ATIVAN) tablet 0.5 mg  0.5 mg Oral BID PRN Elgergawy, Silver Huguenin, MD      . lubiprostone (AMITIZA) capsule 24 mcg  24 mcg Oral BID Elgergawy, Silver Huguenin, MD   24 mcg at 03/17/19 2226  . methocarbamol (ROBAXIN) tablet 500 mg  500 mg Oral Q6H PRN Elgergawy, Silver Huguenin, MD       Or  . methocarbamol (ROBAXIN) 500 mg in dextrose 5 % 50 mL IVPB  500 mg Intravenous Q6H PRN Elgergawy, Silver Huguenin, MD      . morphine (MS CONTIN) 12 hr tablet 30 mg  30 mg Oral BID Elgergawy, Silver Huguenin, MD   30 mg at 03/17/19 2227  .  morphine 2 MG/ML injection 1 mg  1 mg Intravenous Q3H PRN Elgergawy, Silver Huguenin, MD   1 mg at 03/17/19 2101  . mupirocin ointment (BACTROBAN) 2 % 1 application  1 application Nasal BID Elgergawy, Silver Huguenin, MD   1 application at 48/18/56 0506  . Vitamin D (Ergocalciferol) (DRISDOL) capsule 50,000 Units  50,000 Units Oral Once per day on Mon Thu Elgergawy, Silver Huguenin, MD         Physical Exam(=30) BP 110/81 (BP Location: Left Arm)   Pulse (!) 109   Temp 98.1 F (36.7 C) (Oral)   Resp 16   Ht 5\' 10"  (1.778 m)   Wt 59 kg   SpO2 93%   BMI 18.65 kg/m   Gen. Appearance small frail female resting comfortably in bed Peripheral vascular system poor peripheral vascular system with weak pulses venous stasis changes and ulceration right leg at the ankle and foot Lymph nodes groin areas negative to palpable lymph nodes Gait unable to walk secondary to right hip fracture  Left Upper extremity  Inspection revealed no malalignment or asymmetry  Assessment of  range of motion: Full range of motion was recorded  Assessment of stability: Elbow wrist and hand and shoulder were stable  Assessment of muscle strength and tone revealed grade 5 muscle strength and normal muscle tone  Skin was normal without rash lesion or ulceration  Right upper extremity  Inspection revealed no malalignment or asymmetry  Assessment of range of motion: Full range of motion was recorded  Assessment of stability: Elbow wrist and hand and shoulder were stable  Assessment of muscle strength and tone revealed grade 5 muscle strength and normal muscle tone  Skin was normal without rash lesion or ulceration  Right Lower extremity Right lower extremity she has ulcers on the right lower extremity ankle and foot covered with bandages medical history indicates treatment at the wound care center Inspection reveals an externally rotated right lower extremity with no shortening Range of motion not assessed secondary to pain gentle logroll caused right groin pain Hip knee and ankle are reduced without subluxation dislocation Muscle tone is normal no tremor  Left lower extremity  Inspection revealed no malalignment or asymmetry  Assessment of range of motion: Full range of motion was recorded  Assessment of stability: Ankle, knee and hip were stable  Assessment of muscle strength and tone revealed grade 5 muscle strength and normal muscle tone  Skin was normal without rash lesion or ulceration   Coordination was tested by finger-to-nose nose and was normal Deep tendon reflexes were 2+ in the upper extremities  Examination of sensation by touch was normal  Mental status  Oriented to person, place but not time  Mood and affect normal without depression anxiety or agitation  Dx:   Data Reviewed  I reviewed the following images and the reports and my independent interpretation is subcapital impacted right hip fracture    Assessment  85 year old female ambulates with a  walker at a nursing home fell fractured her right hip.  She has a subcapital impacted right hip fracture which will require internal fixation with cannulated screws  I talked to her daughter who is her caregiver for medical decisions and she is agreed to proceed with the internal fixation The procedure has been fully reviewed with the patient; The risks and benefits of surgery have been discussed and explained and understood. Alternative treatment has also been reviewed, questions were encouraged and answered. The postoperative plan is also been reviewed.  She has a type and screen no antibodies.  Crossmatch not needed no significant blood loss expected, hemoglobin is 12 at admission  Plan   Open treatment internal fixation right hip with cannulated screws   Carole Civil MD

## 2019-03-18 NOTE — Care Management Important Message (Signed)
Important Message  Patient Details  Name: Betty Carpenter MRN: [id number] Date of Birth: 02-06-1925   Medicare Important Message Given:  Yes    Tommy Medal 03/18/2019, 12:07 PM

## 2019-03-18 NOTE — Progress Notes (Addendum)
Initial Nutrition Assessment  DOCUMENTATION CODES:  Not applicable  INTERVENTION:  Ensure Enlive po BID, each supplement provides 350 kcal and 20 grams of protein  MVI with minerals  NUTRITION DIAGNOSIS:  Increased nutrient needs related to post-op healing, wound healing as evidenced by estimated nutritional requirements for this condition   GOAL:  Patient will meet greater than or equal to 90% of their needs  MONITOR:  PO intake, Supplement acceptance, Labs, I & O's, Weight trends   REASON FOR ASSESSMENT:  Consult Assessment of nutrition requirement/status  ASSESSMENT:  85 y/o female PMHx HTN, Chronic opioid use, Breast cancer s/p mastectomy. Lives at ALF w/ husband. Sent from facility after falling and subsequent R hip pain. In ED, Xray confirms right hip fx. Admitted for operative management.    Pt seen shortly after her surgery. Pt was still drowsy and could provide only a little information. She says prior to falling,  she had been eating well at her facility. She denies taking any vitamins or oral supplements. Noted pt follows in Wound clinic for R leg wound. She says they have not given her any vitamins to take there.   Pts current wt is 68.2 kg, but again she immediately postop and this weight is likely artificially elevatd. Reviewed weight history in Care Everywhere. SHe has been 125-130 lbs for nearly a year. She was ~133 a year ago.   RD asked if she would be agreeable to oral supplements and a vitamin. She was agreeable. She denies uncontrolled pain. Noted she is on an aggressive bowel regimen at baseline. She denies feeling constipated at this time.   Pts underweight status is strongly believed to be d/t both age related sarcopenia and age related anorexia without appropriate oral supplementation to counteract. Given she was reportedly eating well prior to falling and has not had any discernible wt loss, believe her muscle wasting is more related to age, rather than  malnutrition. Will start oral supplements. Would recommend she continue to supplement on discharge.   Labs: Reviewed. Unremarkable.  Meds: Colace, Morphine, amitiza, Vit D, IVF,   Recent Labs  Lab 03/17/19 1724 03/18/19 0618  NA 139 140  K 3.8 3.5  CL 102 107  CO2 26 27  BUN 21 17  CREATININE 0.83 0.65  CALCIUM 9.0 8.6*  GLUCOSE 116* 106*   NUTRITION - FOCUSED PHYSICAL EXAM:   Most Recent Value  Orbital Region  Moderate depletion  Upper Arm Region  Mild depletion  Thoracic and Lumbar Region  Moderate depletion  Buccal Region  Mild depletion  Temple Region  Moderate depletion  Clavicle Bone Region  Moderate depletion  Clavicle and Acromion Bone Region  Moderate depletion  Scapular Bone Region  Unable to assess  Dorsal Hand  Mild depletion  Patellar Region  Mild depletion  Anterior Thigh Region  Mild depletion  Posterior Calf Region  Mild depletion  Edema (RD Assessment)  None  Hair  Reviewed  Eyes  Reviewed  Mouth  Reviewed  Skin  Reviewed  Nails  Reviewed     Diet Order:   Diet Order            Diet Heart Room service appropriate? Yes; Fluid consistency: Thin  Diet effective now             EDUCATION NEEDS:  No education needs have been identified at this time  Skin:   Stage II pressure wound to R, lateral Leg.  Surgical Incision to R hip  Last BM:  4/30  Height:  Ht Readings from Last 1 Encounters:  03/18/19 5\' 10"  (1.778 m)   Weight:  Wt Readings from Last 1 Encounters:  03/18/19 58.9 kg   Wt Readings from Last 10 Encounters:  03/18/19 58.9 kg  02/26/18 60.3 kg  10/23/17 55.9 kg  01/28/12 62.3 kg   Ideal Body Weight:  68.18 kg  BMI:  Body mass index is 18.63 kg/m.  Estimated Nutritional Needs:  Kcal:  1650-1800 (28-31 kcal/kg bw) Protein:  77-88g Pro (1.3-1.5g/kg bw) Fluid:  >1.5 L (25 ml/kg bw)  Burtis Junes RD, LDN, CNSC Clinical Nutrition Available Tues-Sat via Pager: [phone] 03/18/2019 2:20 PM

## 2019-03-18 NOTE — Anesthesia Postprocedure Evaluation (Signed)
Anesthesia Post Note  Patient: Betty Carpenter  Procedure(s) Performed: CANNULATED HIP PINNING (Right Hip)  Patient location during evaluation: PACU Anesthesia Type: General Level of consciousness: awake and oriented Pain management: pain level controlled Vital Signs Assessment: post-procedure vital signs reviewed and stable Respiratory status: spontaneous breathing Cardiovascular status: stable Postop Assessment: no apparent nausea or vomiting Anesthetic complications: no     Last Vitals:  Vitals:   03/18/19 1315 03/18/19 1330  BP: (!) 143/78 138/74  Pulse: 70 76  Resp: (!) 9 (!) 28  Temp:    SpO2: 100% 93%    Last Pain:  Vitals:   03/18/19 1315  TempSrc:   PainSc: 4                  ADAMS, AMY A

## 2019-03-18 NOTE — [place] FL2 (Signed)
[place] MEDICAID FL2 LEVEL OF CARE SCREENING TOOL     IDENTIFICATION  Patient Name: Betty Carpenter Birthdate: [birth year] Sex: female Admission Date (Current Location): 03/17/2019  [facility] and [place] Number:  [employer] and Address:  [facility] [address] [place]      Provider Number: [id number]  Attending Physician Name and Address:  Barton Dubois, MD  Relative Name and Phone Number:  Enid Derry [phone]    Current Level of Care: Hospital Recommended Level of Care: [place] Prior Approval Number:    Date Approved/Denied:   PASRR Number:    Discharge Plan: SNF    Current Diagnoses: Patient Active Problem List   Diagnosis Date Noted  . Pressure injury of skin 03/18/2019  . Closed right hip fracture ([place]) 03/17/2019  . Sinus tachycardia 03/17/2019  . History of breast cancer 10/23/2017  . History of DVT (deep vein thrombosis) 10/23/2017  . Chronic back pain 10/23/2017  . Benign essential HTN 10/23/2017  . Hypertensive urgency 10/23/2017  . Chronic, continuous use of opioids 10/23/2017  . Sepsis ([place]) 10/22/2017  . Breast cancer, stage 1 ([place]) 01/28/2012    Orientation RESPIRATION BLADDER Height & Weight     Self, Time, Situation, Place  Normal Continent Weight: 59 kg Height:  5\' 10"  (177.8 cm)  BEHAVIORAL SYMPTOMS/MOOD NEUROLOGICAL BOWEL NUTRITION STATUS  (N/A) (N/A) Continent Diet  AMBULATORY STATUS COMMUNICATION OF NEEDS Skin   Extensive Assist Verbally Skin abrasions, PU Stage and Appropriate Care(abrasion to bilateral arms; stage II pressure ulcer to Right lateral Ankle)   PU Stage 2 Dressing: Daily                   Personal Care Assistance Level of Assistance  Bathing, Feeding, Dressing Bathing Assistance: Maximum assistance Feeding assistance: Independent Dressing Assistance: Maximum assistance     Functional Limitations Info  Sight, Hearing, Speech Sight Info: Adequate Hearing  Info: Adequate Speech Info: Adequate    SPECIAL CARE FACTORS FREQUENCY  PT (By licensed PT)     PT Frequency: 5days/week              Contractures Contractures Info: Not present    Additional Factors Info  Code Status, Allergies, Psychotropic Code Status Info: full Allergies Info: Cymbalta Psychotropic Info: Ativan as needed for anxiety         Current Medications (03/18/2019):  This is the current hospital active medication list Current Facility-Administered Medications  Medication Dose Route Frequency Provider Last Rate Last Dose  . 0.9 %  sodium chloride infusion   Intravenous Continuous Elgergawy, Silver Huguenin, MD 50 mL/hr at 03/18/19 0527    . acetaminophen (TYLENOL) tablet 1,000 mg  1,000 mg Oral Q6H PRN Elgergawy, Silver Huguenin, MD      . amLODipine (NORVASC) tablet 5 mg  5 mg Oral BID Elgergawy, Silver Huguenin, MD   5 mg at 03/18/19 0801  . celecoxib (CELEBREX) capsule 200 mg  200 mg Oral BID Elgergawy, Silver Huguenin, MD   200 mg at 03/17/19 2226  . Chlorhexidine Gluconate Cloth 2 % PADS 6 each  6 each Topical Daily Elgergawy, Silver Huguenin, MD   6 each at 03/18/19 [phone]  . docusate sodium (COLACE) capsule 100 mg  100 mg Oral BID Elgergawy, Silver Huguenin, MD   100 mg at 03/17/19 2226  . heparin injection 5,000 Units  5,000 Units Subcutaneous Q8H Elgergawy, Silver Huguenin, MD   5,000 Units at 03/17/19 2227  . HYDROcodone-acetaminophen (NORCO/VICODIN) 5-325  MG per tablet 1-2 tablet  1-2 tablet Oral Q6H PRN Elgergawy, Silver Huguenin, MD   2 tablet at 03/17/19 2226  . LORazepam (ATIVAN) tablet 0.5 mg  0.5 mg Oral BID PRN Elgergawy, Silver Huguenin, MD      . lubiprostone (AMITIZA) capsule 24 mcg  24 mcg Oral BID Elgergawy, Silver Huguenin, MD   24 mcg at 03/17/19 2226  . methocarbamol (ROBAXIN) tablet 500 mg  500 mg Oral Q6H PRN Elgergawy, Silver Huguenin, MD       Or  . methocarbamol (ROBAXIN) 500 mg in dextrose 5 % 50 mL IVPB  500 mg Intravenous Q6H PRN Elgergawy, Silver Huguenin, MD      . morphine (MS CONTIN) 12 hr tablet 30 mg  30 mg  Oral BID Elgergawy, Silver Huguenin, MD   30 mg at 03/18/19 0801  . morphine 2 MG/ML injection 1 mg  1 mg Intravenous Q3H PRN Elgergawy, Silver Huguenin, MD   1 mg at 03/17/19 2101  . mupirocin ointment (BACTROBAN) 2 % 1 application  1 application Nasal BID Elgergawy, Silver Huguenin, MD   1 application at 43/60/67 0801  . Vitamin D (Ergocalciferol) (DRISDOL) capsule 50,000 Units  50,000 Units Oral Once per day on Mon Thu Elgergawy, Silver Huguenin, MD         Discharge Medications: Please see discharge summary for a list of discharge medications.  Relevant Imaging Results:  Relevant Lab Results:   Additional Information ssn: [id number]  Sherald Barge, RN

## 2019-03-18 NOTE — Progress Notes (Signed)
OT Cancellation Note  Patient Details Name: RYKA BEIGHLEY MRN: [id number] DOB: 1925/07/08   Cancelled Treatment:    Reason Eval/Treat Not Completed: Medical issues which prohibited therapy;Other (comment). Pt is scheduled for ORIF this am. OT will resolve current order, please reorder OT after sx when pt is appropriate.    Guadelupe Sabin, OTR/L  [phone] 03/18/2019, 8:49 AM

## 2019-03-18 NOTE — Op Note (Signed)
03/18/2019  12:45 PM  PATIENT:  Betty Carpenter  85 y.o. female  PRE-OPERATIVE DIAGNOSIS:  right femoral fracture  POST-OPERATIVE DIAGNOSIS:  right femoral fracture  PROCEDURE:  Procedure(s) with comments: CANNULATED HIP PINNING (Right) - 11 am   Findings subcapital impacted fracture right femoral neck  Implants 3 Biomet Zimmer titanium cannulated screws with washers  SURGEON:  Surgeon(s) and Role:    Carole Civil, MD - Primary  No surgical assistants  Surgery was done as follows.  The patient was seen in the preop area the patient's identity was confirmed using 2 separate mechanisms chart review was completed surgical site confirmed surgical site marked.  Patient was taken to surgery.  The patient was given general anesthesia.  The patient was placed on the fracture table.  The left leg was placed in a well leg holder with 90 degrees of hip flexion 90 degrees of knee flexion and padding around the peroneal nerve  The right leg was placed in traction against the perineal post which was padded  C arm was brought in.  Gentle traction and internal rotation revealed that the fracture was stable and nondisplaced.  The leg was prepped and draped sterilely and timeout was completed  I checked the implants prior to coming to the OR  I made an incision over the greater trochanter extended it distally.  I divided the subcutaneous fat the fascia and the vastus lateralis fascia.  Expose the proximal femur with a periosteal elevator.  I used a multi hole pin guide and placed an inferior pin checked it with the C arm and then placed 2 additional superior pins anterior and posterior and used the C arm to guide placement  Once I was satisfied with the placement I measured each pin and then overdrilled the cortex only and then applied the screws under power tightened and then by hand with a hand screwdriver.  I checked all the screws and washers and they were in good position there was no head  penetration  I irrigated the wound coagulated any small bleeders.  I injected 60 cc of Marcaine with epinephrine.  I closed the vastus fascia with 0 Monocryl I closed the regular fascia with #1 Braylon I closed the subcu with 0 Monocryl I closed the skin with staples  I applied a dressing  After extubation the patient was taken to the recovery room in stable condition   Postoperative plan  Weightbearing as tolerated  Staples out at postop day 14  X-rays at 2 weeks 6 weeks 12 weeks  DVT prevention aspirin 28 days    PHYSICIAN ASSISTANT:   ASSISTANTS: none   ANESTHESIA:   general  EBL:  50 mL   BLOOD ADMINISTERED:none  DRAINS: none   LOCAL MEDICATIONS USED:  MARCAINE     SPECIMEN:  No Specimen  DISPOSITION OF SPECIMEN:  N/A  COUNTS:  YES  TOURNIQUET:  * No tourniquets in log *  DICTATION: .Dragon Dictation  PLAN OF CARE: Admit to inpatient   PATIENT DISPOSITION:  PACU - hemodynamically stable.   Delay start of Pharmacological VTE agent (>24hrs) due to surgical blood loss or risk of bleeding: no

## 2019-03-18 NOTE — TOC Progression Note (Signed)
Transition of Care [facility]) - Progression Note    Patient Details  Name: Betty Carpenter MRN: [id number] Date of Birth: [birth year]  [facility]) CM/SW Contact  Roda Shutters Margretta Sidle, RN Phone Number: 03/18/2019, 3:57 PM  Clinical Narrative:   Surgery today. CM discussed DC options with daughter. Referrals sent to [facility] and [facility]. [facility] has offered bed which is daughters first choice. PT eval pending. Pt requires 30-day note for PASRR. FL2 has not yet been signed for submission. TOC staff will cont to follow on Monday.     Expected Discharge Plan: [place] Barriers to Discharge: No Barriers Identified  Expected Discharge Plan and Services Expected Discharge Plan: [place] In-house Referral: Clinical Social Work     Living arrangements for the past 2 months: [place]

## 2019-03-18 NOTE — Anesthesia Preprocedure Evaluation (Addendum)
Anesthesia Evaluation  Patient identified by MRN, date of birth, ID band Patient awake    Reviewed: Allergy & Precautions, NPO status , Patient's Chart, lab work & pertinent test results  Airway Mallampati: II  TM Distance: >3 FB Neck ROM: Full    Dental no notable dental hx. (+) Edentulous Upper, Edentulous Lower   Pulmonary neg pulmonary ROS,    Pulmonary exam normal breath sounds clear to auscultation       Cardiovascular Exercise Tolerance: Good hypertension, Pt. on medications negative cardio ROS Normal cardiovascular examI Rhythm:Regular Rate:Tachycardia  Denies any known cardiac issues -rate increased -probably  due to pain  H/o Blood thinner for remote DVT -off Xarelto since 09/2017 per chart   Neuro/Psych  Neuromuscular disease negative psych ROS   GI/Hepatic negative GI ROS, Neg liver ROS,   Endo/Other  negative endocrine ROS  Renal/GU negative Renal ROS  negative genitourinary   Musculoskeletal  (+) Arthritis , Osteoarthritis,    Abdominal   Peds negative pediatric ROS (+)  Hematology negative hematology ROS (+)   Anesthesia Other Findings   Reproductive/Obstetrics negative OB ROS                             Anesthesia Physical Anesthesia Plan  ASA: III  Anesthesia Plan: General   Post-op Pain Management:    Induction: Intravenous  PONV Risk Score and Plan:   Airway Management Planned: Oral ETT  Additional Equipment:   Intra-op Plan:   Post-operative Plan:   Informed Consent: I have reviewed the patients History and Physical, chart, labs and discussed the procedure including the risks, benefits and alternatives for the proposed anesthesia with the patient or authorized representative who has indicated his/her understanding and acceptance.     Dental advisory given  Plan Discussed with: CRNA  Anesthesia Plan Comments: (GETA planned -+Q&A Full PPE use  planned  Phone consent from daughter [name] -RN witnessed Fuller Plan RN)       Anesthesia Quick Evaluation

## 2019-03-18 NOTE — Progress Notes (Signed)
Returned from surgery for right hip around 1400.  Dressing to right hip dry and intact with no drainage seen on dressing.  Alert and oriented and asking for food.  Ice pack in place and SCD's on.  Able to move right foot and has full sensation.  Daughter called and patient talked to daughter.

## 2019-03-18 NOTE — Progress Notes (Signed)
Called to the patients room.  Entering the room was told by the nurse tech that the patient had pulled her foley cath out.  No noted trauma upon assessment.  On call MD notified.  Purewick placed.  Will continue to monitor.

## 2019-03-18 NOTE — Progress Notes (Signed)
Before surgery this morning tele called and reported that patient was showing afib on monitor.  Contacted Dr. Dyann Kief who ordered EKG.  Once read, he said he did not believe it was afib and said she was fine for surgery.  Called daughter and let her know that patient was going to surgery.

## 2019-03-18 NOTE — Progress Notes (Signed)
PROGRESS NOTE    Betty Carpenter  PYK:[id number] DOB: Apr 07, 1925 DOA: 03/17/2019 PCP: Dione Housekeeper, MD     Brief Narrative:  85 y.o. female, with medical history significant ofHTN, chronic back pain, chronic opioid use, history of DVT used to be on Xarelto (stopped 1.5 years ago), and breast cancers/pmastectomy, patient was sent from her facility secondary to mechanical fall, and right hip pain, patient cannot recall exact events, but usually she ambulates with a cane, remember she was walking without her cane, and she does report mechanical fall while walking outside, she denies any dizziness, lightheadedness, syncope, loss of consciousness or any focal deficits, denies tingling and numbness; she reports immediate right hip pain after her fall and was transfer to ED. Denies any fever, chest pain, shortness of breath, dysuria, polyuria. - in ED hip x-ray significant for right hip fracture, no significant lab abnormality, she was noted to have sinus tachycardia secondary to significant pain, orthopedic was consulted by ED, TR[facility] was called to admit   Assessment & Plan: 1- right hip pain: due to hip fracture -s/p ORIF -tolerated poccedure well -will follow orthopedic service post operative rec's -continue PRN pain meds -PT evaluation.  2-Sinus tachycardia -EKG demonstrating sinus rhythm  -continue pain management and supportive care -will monitor on telemetry -follow electrolytes  3-HTN -stable -will continue amlodipine   4-chronic back pain -stable overall -continue home PRN pain meds  5-Hx of DVT -completed treatment with xarelto approx 1.5 year ago -continue heparin for DVT prophylaxis -will recommend either xarelto or eliquis at discharge for DVT prophylaxis after surgery; but will discuss with orthopedic surgery.  6-moderate protein calorie malnutrition -follow dietitian rec's for feeding supplement -Body mass index is 18.63 kg/m.  7-hx of breast cancer -s/p  mastectomy -appears to be in total remission    DVT prophylaxis: heparin Code Status: Full Code Family Communication: no family at bedside  Disposition Plan: will anticipate discharge back to SNF at discharge for further care and rehab. Follow Hgb trend and overall stability after surgery.  Consultants:   Orthopedic service.  Procedures:   Right hip ORIF 03/18/19  Antimicrobials:  Anti-infectives (From admission, onward)   Start     Dose/Rate Route Frequency Ordered Stop   03/18/19 1600  ceFAZolin (ANCEF) IVPB 2g/100 mL premix     2 g 200 mL/hr over 30 Minutes Intravenous Every 6 hours 03/18/19 1401 03/19/19 0359   03/18/19 1115  ceFAZolin (ANCEF) IVPB 2g/100 mL premix     2 g 200 mL/hr over 30 Minutes Intravenous On call to O.R. 03/18/19 1102 03/18/19 1130       Subjective: Afebrile, no CP, no SOB, no nausea, no vomiting. While not moving pain is stable.  Objective: Vitals:   03/18/19 1330 03/18/19 1345 03/18/19 1400 03/18/19 1516  BP: 138/74 (!) 142/82 (!) 143/66 (!) 150/73  Pulse: 76 71 68 82  Resp: (!) 28 14 18 16   Temp:   98.2 F (36.8 C) 98.5 F (36.9 C)  TempSrc:    Oral  SpO2: 93% 99%  95%  Weight:      Height:        Intake/Output Summary (Last 24 hours) at 03/18/2019 1755 Last data filed at 03/18/2019 1700 Gross per 24 hour  Intake 1783.2 ml  Output 425 ml  Net 1358.2 ml   Filed Weights   03/17/19 1658 03/18/19 1054  Weight: 59 kg 58.9 kg    Examination: General exam: Alert, awake, oriented; no CP, no SOB. Respiratory system: Clear  to auscultation. Respiratory effort normal. Cardiovascular system:sinus tachycardia, no rubs, no gallops, no JVD Gastrointestinal system: Abdomen is nondistended, soft and nontender. No organomegaly or masses felt. Normal bowel sounds heard. Central nervous system: Alert and oriented. No focal neurological deficits. Extremities: No Cyanosis, no clubbing; Right hip with clean dressings in place. Skin: No rashes, no  petechiae. Psychiatry: Mood & affect appropriate.    Data Reviewed: I have personally reviewed following labs and imaging studies  CBC: Recent Labs  Lab 03/17/19 1724 03/18/19 0618  WBC 5.1 4.8  NEUTROABS 4.2  --   HGB 12.0 11.1*  HCT 38.5 34.7*  MCV 93.2 90.8  PLT 189 160   Basic Metabolic Panel: Recent Labs  Lab 03/17/19 1724 03/18/19 0618  NA 139 140  K 3.8 3.5  CL 102 107  CO2 26 27  GLUCOSE 116* 106*  BUN 21 17  CREATININE 0.83 0.65  CALCIUM 9.0 8.6*   GFR: Estimated Creatinine Clearance: 42.6 mL/min (by C-G formula based on SCr of 0.65 mg/dL).  Coagulation Profile: Recent Labs  Lab 03/17/19 1724  INR 1.0   Urine analysis:    Component Value Date/Time   COLORURINE STRAW (A) 02/26/2018 0832   APPEARANCEUR CLEAR 02/26/2018 0832   LABSPEC 1.008 02/26/2018 0832   PHURINE 8.0 02/26/2018 0832   GLUCOSEU NEGATIVE 02/26/2018 0832   HGBUR SMALL (A) 02/26/2018 0832   BILIRUBINUR NEGATIVE 02/26/2018 0832   KETONESUR 5 (A) 02/26/2018 0832   PROTEINUR NEGATIVE 02/26/2018 0832   NITRITE NEGATIVE 02/26/2018 0832   LEUKOCYTESUR NEGATIVE 02/26/2018 0832    Recent Results (from the past 240 hour(s))  Surgical PCR screen     Status: Abnormal   Collection Time: 03/17/19 11:10 PM  Result Value Ref Range Status   MRSA, PCR NEGATIVE NEGATIVE Final   Staphylococcus aureus POSITIVE (A) NEGATIVE Final    Comment: (NOTE) The Xpert SA Assay (FDA approved for NASAL specimens in patients 62 years of age and older), is one component of a comprehensive surveillance program. It is not intended to diagnose infection nor to guide or monitor treatment. Performed at [facility], [address], [place], [place] [address]      Radiology Studies: Dg Chest [facility] 1 View  Result Date: 03/17/2019 CLINICAL DATA:  RIGHT hip fracture post fall, preoperative evaluation EXAM: PORTABLE CHEST 1 VIEW COMPARISON:  Portable exam 2125 hours compared to 10/21/2017 FINDINGS: Normal heart size,  mediastinal contours, and pulmonary vascularity. Atherosclerotic calcification aorta. Lungs appear emphysematous but clear. No infiltrate, pleural effusion, or pneumothorax. Diffuse osseous demineralization. Surgical clips RIGHT axilla. IMPRESSION: Emphysematous changes without infiltrate. Electronically Signed   By: Lavonia Dana M.D.   On: 03/17/2019 21:34   Dg Hip Operative Unilat W Or W/o Pelvis Right  Result Date: 03/18/2019 CLINICAL DATA:  Right hip fracture EXAM: OPERATIVE right HIP (WITH PELVIS IF PERFORMED) 6 VIEWS TECHNIQUE: Fluoroscopic spot image(s) were submitted for interpretation post-operatively. COMPARISON:  03/17/2019 FINDINGS: Multiple C-arm images show placement of 3 Knowles type pins for treatment of a femoral neck fracture. Components appear well positioned without radiographically detectable complication. IMPRESSION: Three Knowles type pins placed for treatment of a femoral neck fracture on the right. Electronically Signed   By: Nelson Chimes M.D.   On: 03/18/2019 13:38   Dg Hip Unilat With Pelvis 2-3 Views Right  Result Date: 03/17/2019 CLINICAL DATA:  [place] today with right hip pain and external rotation. EXAM: DG HIP (WITH OR WITHOUT PELVIS) 2-3V RIGHT COMPARISON:  None. FINDINGS: Impacted femoral neck  fracture. No intertrochanteric component seen. Other bones of the pelvis appear negative. IMPRESSION: Impacted femoral neck fracture on the right. Electronically Signed   By: Nelson Chimes M.D.   On: 03/17/2019 18:39    Scheduled Meds: . amLODipine  5 mg Oral BID  . [START ON 03/19/2019] aspirin EC  325 mg Oral Q breakfast  . celecoxib  200 mg Oral BID  . Chlorhexidine Gluconate Cloth  6 each Topical Daily  . docusate sodium  100 mg Oral BID  . feeding supplement (ENSURE ENLIVE)  237 mL Oral BID BM  . lubiprostone  24 mcg Oral BID  . morphine  30 mg Oral BID  . multivitamin  15 mL Oral Daily  . mupirocin ointment  1 application Nasal BID  . Vitamin D (Ergocalciferol)  50,000  Units Oral Once per day on Mon Thu   Continuous Infusions: . sodium chloride 50 mL/hr at 03/18/19 1517  .  ceFAZolin (ANCEF) IV 2 g (03/18/19 1523)  . methocarbamol (ROBAXIN) IV       LOS: 1 day    Time spent: 30 minutes.   Barton Dubois, MD [facility] Pager [phone]   03/18/2019, 5:55 PM

## 2019-03-18 NOTE — Transfer of Care (Signed)
Immediate Anesthesia Transfer of Care Note  Patient: Betty Carpenter  Procedure(s) Performed: CANNULATED HIP PINNING (Right Hip)  Patient Location: PACU  Anesthesia Type:General  Level of Consciousness: drowsy and patient cooperative  Airway & Oxygen Therapy: Patient Spontanous Breathing and Patient connected to face mask oxygen  Post-op Assessment: Report given to RN and Post -op Vital signs reviewed and stable  Post vital signs: Reviewed and stable  Last Vitals:  Vitals Value Taken Time  BP 170/82 03/18/2019  1:00 PM  Temp    Pulse 71 03/18/2019  1:06 PM  Resp 13 03/18/2019  1:06 PM  SpO2 97 % 03/18/2019  1:06 PM  Vitals shown include unvalidated device data.  Last Pain:  Vitals:   03/18/19 1054  TempSrc:   PainSc: 5       Patients Stated Pain Goal: 2 (12/45/80 9983)  Complications: No apparent anesthesia complications

## 2019-03-18 NOTE — Interval H&P Note (Signed)
History and Physical Interval Note:  03/18/2019 11:17 AM  Betty Carpenter  has presented today for surgery, with the diagnosis of right femoral fracture.  The various methods of treatment have been discussed with the patient and family. After consideration of risks, benefits and other options for treatment, the patient has consented to  Procedure(s) with comments: CANNULATED HIP PINNING (Right) - 11 am  as a surgical intervention.  The patient's history has been reviewed, patient examined, no change in status, stable for surgery.  I have reviewed the patient's chart and labs.  Questions were answered to the patient's satisfaction.     Arther Abbott

## 2019-03-18 NOTE — Anesthesia Procedure Notes (Signed)
Procedure Name: Intubation Date/Time: 03/18/2019 11:06 AM Performed by: Andree Elk, Amy A, CRNA Pre-anesthesia Checklist: Patient identified, Patient being monitored, Timeout performed, Emergency Drugs available and Suction available Patient Re-evaluated:Patient Re-evaluated prior to induction Oxygen Delivery Method: Circle system utilized Preoxygenation: Pre-oxygenation with 100% oxygen Induction Type: IV induction Ventilation: Mask ventilation without difficulty Laryngoscope Size: 3 and Glidescope Grade View: Grade I Tube type: Oral Tube size: 7.0 mm Number of attempts: 1 Airway Equipment and Method: Stylet Placement Confirmation: ETT inserted through vocal cords under direct vision,  positive ETCO2 and breath sounds checked- equal and bilateral Secured at: 21 cm Tube secured with: Tape Dental Injury: Teeth and Oropharynx as per pre-operative assessment

## 2019-03-18 NOTE — Brief Op Note (Signed)
03/18/2019  12:45 PM  PATIENT:  Betty Carpenter  85 y.o. female  PRE-OPERATIVE DIAGNOSIS:  right femoral fracture  POST-OPERATIVE DIAGNOSIS:  right femoral fracture  PROCEDURE:  Procedure(s) with comments: CANNULATED HIP PINNING (Right) - 11 am   Findings subcapital impacted fracture right femoral neck  Implants 3 Biomet Zimmer titanium cannulated screws with washers  SURGEON:  Surgeon(s) and Role:    Carole Civil, MD - Primary  No surgical assistants  Surgery was done as follows.  The patient was seen in the preop area the patient's identity was confirmed using 2 separate mechanisms chart review was completed surgical site confirmed surgical site marked.  Patient was taken to surgery.  The patient was given general anesthesia.  The patient was placed on the fracture table.  The left leg was placed in a well leg holder with 90 degrees of hip flexion 90 degrees of knee flexion and padding around the peroneal nerve  The right leg was placed in traction against the perineal post which was padded  C arm was brought in.  Gentle traction and internal rotation revealed that the fracture was stable and nondisplaced.  The leg was prepped and draped sterilely and timeout was completed  I checked the implants prior to coming to the OR  I made an incision over the greater trochanter extended it distally.  I divided the subcutaneous fat the fascia and the vastus lateralis fascia.  Expose the proximal femur with a periosteal elevator.  I used a multi hole pin guide and placed an inferior pin checked it with the C arm and then placed 2 additional superior pins anterior and posterior and used the C arm to guide placement  Once I was satisfied with the placement I measured each pin and then overdrilled the cortex only and then applied the screws under power tightened and then by hand with a hand screwdriver.  I checked all the screws and washers and they were in good position there was no head  penetration  I irrigated the wound coagulated any small bleeders.  I injected 60 cc of Marcaine with epinephrine.  I closed the vastus fascia with 0 Monocryl I closed the regular fascia with #1 Braylon I closed the subcu with 0 Monocryl I closed the skin with staples  I applied a dressing  After extubation the patient was taken to the recovery room in stable condition   Postoperative plan  Weightbearing as tolerated  Staples out at postop day 14  X-rays at 2 weeks 6 weeks 12 weeks  DVT prevention aspirin 28 days    PHYSICIAN ASSISTANT:   ASSISTANTS: none   ANESTHESIA:   general  EBL:  50 mL   BLOOD ADMINISTERED:none  DRAINS: none   LOCAL MEDICATIONS USED:  MARCAINE     SPECIMEN:  No Specimen  DISPOSITION OF SPECIMEN:  N/A  COUNTS:  YES  TOURNIQUET:  * No tourniquets in log *  DICTATION: .Dragon Dictation  PLAN OF CARE: Admit to inpatient   PATIENT DISPOSITION:  PACU - hemodynamically stable.   Delay start of Pharmacological VTE agent (>24hrs) due to surgical blood loss or risk of bleeding: no

## 2019-03-18 NOTE — Progress Notes (Signed)
PT Cancellation Note  Patient Details Name: NILZA EAKER MRN: [id number] DOB: 1925-01-20   Cancelled Treatment:    Reason Eval/Treat Not Completed: Patient at procedure or test/unavailable.  Patient scheduled to have surgery for right hip fracture today.  Will follow up after surgery.   9:44 AM, 03/18/19 Lonell Grandchild, MPT Physical Therapist with [facility] [place] [phone] office [phone] mobile phone

## 2019-03-18 NOTE — Consult Note (Signed)
HOSPITAL CONSULT (HIP FRACTURE )    Patient ID: Betty Carpenter, female   DOB: 1925-11-13, 85 y.o.   MRN: [id number]  New patient  Requested by:  Reason for:   Chief Complaint  Patient presents with  . Hip Pain   Right//hip pain//   Betty Carpenter is a 85 y.o. female.    HPI  85 year old female at nursing home and [place] walks with a walker fell on March 17, 2019 injured her right hip presented to the ER with an externally rotated right lower extremity complaining of right hip pain and difficulty standing and ambulating  No pain at rest pain with motion and palpation Quality of pain dull  Review of Systems (all) Review of Systems  Unable to perform ROS: Acuity of condition    Past Medical History:  Diagnosis Date  . Arthritis   . Breast cancer ([place])   . Chronic back pain   . Closed wedge compression fracture of L1 vertebra ([place]) 2004  . Hypertension   . Sciatica      Past Surgical History:  Procedure Laterality Date  . APPENDECTOMY      Family History  Problem Relation Age of Onset  . Cancer Maternal Grandfather     Social History Social History   Tobacco Use  . Smoking status: Never Smoker  . Smokeless tobacco: Never Used  Substance Use Topics  . Alcohol use: Not on file  . Drug use: Not on file    Allergies  Allergen Reactions  . Cymbalta [Duloxetine Hcl]     " couldn't make water"- unable to urinate    Current Facility-Administered Medications  Medication Dose Route Frequency Provider Last Rate Last Dose  . 0.9 %  sodium chloride infusion   Intravenous Continuous Elgergawy, Silver Huguenin, MD 50 mL/hr at 03/18/19 0527    . acetaminophen (TYLENOL) tablet 1,000 mg  1,000 mg Oral Q6H PRN Elgergawy, Silver Huguenin, MD      . amLODipine (NORVASC) tablet 5 mg  5 mg Oral BID Elgergawy, Silver Huguenin, MD   5 mg at 03/17/19 2226  . celecoxib (CELEBREX) capsule 200 mg  200 mg Oral BID Elgergawy, Silver Huguenin, MD   200 mg at 03/17/19 2226  . Chlorhexidine Gluconate Cloth 2 %  PADS 6 each  6 each Topical Daily Elgergawy, Silver Huguenin, MD      . docusate sodium (COLACE) capsule 100 mg  100 mg Oral BID Elgergawy, Silver Huguenin, MD   100 mg at 03/17/19 2226  . heparin injection 5,000 Units  5,000 Units Subcutaneous Q8H Elgergawy, Silver Huguenin, MD   5,000 Units at 03/17/19 2227  . HYDROcodone-acetaminophen (NORCO/VICODIN) 5-325 MG per tablet 1-2 tablet  1-2 tablet Oral Q6H PRN Elgergawy, Silver Huguenin, MD   2 tablet at 03/17/19 2226  . LORazepam (ATIVAN) tablet 0.5 mg  0.5 mg Oral BID PRN Elgergawy, Silver Huguenin, MD      . lubiprostone (AMITIZA) capsule 24 mcg  24 mcg Oral BID Elgergawy, Silver Huguenin, MD   24 mcg at 03/17/19 2226  . methocarbamol (ROBAXIN) tablet 500 mg  500 mg Oral Q6H PRN Elgergawy, Silver Huguenin, MD       Or  . methocarbamol (ROBAXIN) 500 mg in dextrose 5 % 50 mL IVPB  500 mg Intravenous Q6H PRN Elgergawy, Silver Huguenin, MD      . morphine (MS CONTIN) 12 hr tablet 30 mg  30 mg Oral BID Elgergawy, Silver Huguenin, MD   30 mg at 03/17/19 2227  .  morphine 2 MG/ML injection 1 mg  1 mg Intravenous Q3H PRN Elgergawy, Silver Huguenin, MD   1 mg at 03/17/19 2101  . mupirocin ointment (BACTROBAN) 2 % 1 application  1 application Nasal BID Elgergawy, Silver Huguenin, MD   1 application at 09/10/84 0506  . Vitamin D (Ergocalciferol) (DRISDOL) capsule 50,000 Units  50,000 Units Oral Once per day on Mon Thu Elgergawy, Silver Huguenin, MD         Physical Exam(=30) BP 110/81 (BP Location: Left Arm)   Pulse (!) 109   Temp 98.1 F (36.7 C) (Oral)   Resp 16   Ht 5\' 10"  (1.778 m)   Wt 59 kg   SpO2 93%   BMI 18.65 kg/m   Gen. Appearance small frail female resting comfortably in bed Peripheral vascular system poor peripheral vascular system with weak pulses venous stasis changes and ulceration right leg at the ankle and foot Lymph nodes groin areas negative to palpable lymph nodes Gait unable to walk secondary to right hip fracture  Left Upper extremity  Inspection revealed no malalignment or asymmetry  Assessment of  range of motion: Full range of motion was recorded  Assessment of stability: Elbow wrist and hand and shoulder were stable  Assessment of muscle strength and tone revealed grade 5 muscle strength and normal muscle tone  Skin was normal without rash lesion or ulceration  Right upper extremity  Inspection revealed no malalignment or asymmetry  Assessment of range of motion: Full range of motion was recorded  Assessment of stability: Elbow wrist and hand and shoulder were stable  Assessment of muscle strength and tone revealed grade 5 muscle strength and normal muscle tone  Skin was normal without rash lesion or ulceration  Right Lower extremity Right lower extremity she has ulcers on the right lower extremity ankle and foot covered with bandages medical history indicates treatment at the wound care center Inspection reveals an externally rotated right lower extremity with no shortening Range of motion not assessed secondary to pain gentle logroll caused right groin pain Hip knee and ankle are reduced without subluxation dislocation Muscle tone is normal no tremor  Left lower extremity  Inspection revealed no malalignment or asymmetry  Assessment of range of motion: Full range of motion was recorded  Assessment of stability: Ankle, knee and hip were stable  Assessment of muscle strength and tone revealed grade 5 muscle strength and normal muscle tone  Skin was normal without rash lesion or ulceration   Coordination was tested by finger-to-nose nose and was normal Deep tendon reflexes were 2+ in the upper extremities  Examination of sensation by touch was normal  Mental status  Oriented to person, place but not time  Mood and affect normal without depression anxiety or agitation  Dx:   Data Reviewed  I reviewed the following images and the reports and my independent interpretation is subcapital impacted right hip fracture    Assessment  85 year old female ambulates with a  walker at a nursing home fell fractured her right hip.  She has a subcapital impacted right hip fracture which will require internal fixation with cannulated screws  I talked to her daughter who is her caregiver for medical decisions and she is agreed to proceed with the internal fixation The procedure has been fully reviewed with the patient; The risks and benefits of surgery have been discussed and explained and understood. Alternative treatment has also been reviewed, questions were encouraged and answered. The postoperative plan is also been reviewed.  She has a type and screen no antibodies.  Crossmatch not needed no significant blood loss expected, hemoglobin is 12 at admission  Plan   Open treatment internal fixation right hip with cannulated screws   Carole Civil MD

## 2019-03-19 LAB — BASIC METABOLIC PANEL
Anion gap: 8 (ref 5–15)
BUN: 18 mg/dL (ref 8–23)
CO2: 27 mmol/L (ref 22–32)
Calcium: 8.3 mg/dL — ABNORMAL LOW (ref 8.9–10.3)
Chloride: 104 mmol/L (ref 98–111)
Creatinine, Ser: 0.66 mg/dL (ref 0.44–1.00)
GFR calc Af Amer: >60 mL/min (ref >60)
GFR calc non Af Amer: >60 mL/min (ref >60)
Glucose, Bld: 102 mg/dL — ABNORMAL HIGH (ref 70–99)
Potassium: 3.5 mmol/L (ref 3.5–5.1)
Sodium: 139 mmol/L (ref 135–145)

## 2019-03-19 LAB — CBC
HCT: 33.8 % — ABNORMAL LOW (ref 36.0–46.0)
Hemoglobin: 10.6 g/dL — ABNORMAL LOW (ref 12.0–15.0)
MCH: 28.8 pg (ref 26.0–34.0)
MCHC: 31.4 g/dL (ref 30.0–36.0)
MCV: 91.8 fL (ref 80.0–100.0)
Platelets: 154 10*3/uL (ref 150–400)
RBC: 3.68 MIL/uL — ABNORMAL LOW (ref 3.87–5.11)
RDW: 13.7 % (ref 11.5–15.5)
WBC: 5 10*3/uL (ref 4.0–10.5)
nRBC: 0 % (ref 0.0–0.2)

## 2019-03-19 MED ORDER — RIVAROXABAN 10 MG PO TABS
10.0000 mg | ORAL_TABLET | Freq: Every day | ORAL | Status: DC
  Administered 2019-03-19 – 2019-03-22 (×4): 10 mg via ORAL
  Filled 2019-03-19 (×5): qty 1

## 2019-03-19 NOTE — Progress Notes (Signed)
Patient ID: Betty Carpenter, female   DOB: Aug 08, 1925, 85 y.o.   MRN: [id number]  POD 1 CANNULATED HIP PINNING RIGHT HIP  BP (!) 149/86 (BP Location: Right Arm)   Pulse 68   Temp (!) 97.5 F (36.4 C) (Oral)   Resp 18   Ht 5\' 10"  (1.778 m)   Wt 58.9 kg   SpO2 100%   BMI 18.63 kg/m   HG IS 11  START PT WBAT   Postoperative plan  Weightbearing as tolerated  Staples out at postop day 14  X-rays at 2 weeks 6 weeks 12 weeks  DVT prevention aspirin 28 days, 325 MG ASPIRIN

## 2019-03-19 NOTE — Evaluation (Signed)
Physical Therapy Evaluation Patient Details Name: Betty Carpenter MRN: [id number] DOB: 1925-07-18 Today's Date: 03/19/2019   History of Present Illness  Betty Carpenter  is a 85 y.o. female s/p CANNULATED HIP PINNING RIGHT HIP 03/18/19 secondary to fall with medical history significant of HTN, chronic back pain, chronic opioid use, history of DVT used to be on Xarelto (stopped 1.5 years ago), and breast cancer s/p mastectomy, patient was sent from her facility secondary to mechanical fall, and right hip pain, patient cannot recall exact events, but usually she ambulates with a cane, remember she was walking without her cane, and she does report mechanical fall while walking outside, she denies any dizziness, lightheadedness, syncope, loss of consciousness or any focal deficits, tingling numbness, she reports immediate right hip pain after her fall and transfer to ED. denies any fever, chest pain, shortness of breath, dysuria, polyuria.    Clinical Impression  Patient functioning well below baseline and limited for functional mobility and gaitt as stated below secondary to BLE weakness, fatigue, RLE pain and poor standing balance.  Patient demonstrated slow labored movement during transfers to [facility] and chair, tolerated sitting up in chair for approximately 3 hours before requesting to go back to bed.  Patient will benefit from continued physical therapy in hospital and recommended venue below to increase strength, balance, endurance for safe ADLs and gait.    Follow Up Recommendations SNF    Equipment Recommendations  None recommended by PT    Recommendations for Other Services       Precautions / Restrictions Precautions Precautions: Fall Restrictions Weight Bearing Restrictions: Yes RLE Weight Bearing: Weight bearing as tolerated      Mobility  Bed Mobility Overal bed mobility: Needs Assistance Bed Mobility: Supine to Sit;Sit to Supine     Supine to sit: Mod assist Sit to supine: Mod assist   General bed mobility comments: required assistance to move RLE  Transfers Overall transfer level: Needs assistance Equipment used: Rolling walker (2 wheeled) Transfers: Sit to/from [facility] Sit to Stand: Mod assist Stand pivot transfers: Mod assist       General transfer comment: increased time, labored movement  Ambulation/Gait Ambulation/Gait assistance: Mod assist;Max assist Gait Distance (Feet): 4 Feet Assistive device: Rolling walker (2 wheeled) Gait Pattern/deviations: Decreased step length - right;Decreased step length - left;Decreased stride length;Antalgic Gait velocity: slow   General Gait Details: liimted to 5-6 slow unsteady steps with difficulty advancing RLE due to increased pain when weightbearing  Stairs            Wheelchair Mobility    Modified Rankin (Stroke Patients Only)       Balance Overall balance assessment: Needs assistance Sitting-balance support: Feet supported;No upper extremity supported Sitting balance-Leahy Scale: Fair     Standing balance support: Bilateral upper extremity supported;During functional activity Standing balance-Leahy Scale: Poor Standing balance comment: fair/poor using RW                             Pertinent Vitals/Pain Pain Assessment: 0-10 Pain Score: 7  Pain Location: low back and right hip with weightbearing Pain Descriptors / Indicators: Grimacing;Sore;Aching Pain Intervention(s): Limited activity within patient's tolerance;Monitored during session;Patient requesting pain meds-RN notified    Home Living Family/patient expects to be discharged to:: Private residence Living Arrangements: Spouse/significant other Available Help at Discharge: Family Type of Home: House Home Access: [place] Hevia: One Yell: [place]ford Rile -  2 wheels;Wheelchair - manual;Cane - single point      Prior Function Level of Independence: Independent with assistive  device(s)         Comments: household ambulator with RW     Hand Dominance        Extremity/Trunk Assessment   Upper Extremity Assessment Upper Extremity Assessment: Generalized weakness    Lower Extremity Assessment Lower Extremity Assessment: Generalized weakness;RLE deficits/detail;LLE deficits/detail RLE Deficits / Details: grossly 3/5 RLE: Unable to fully assess due to pain LLE Deficits / Details: grossly -4/5    Cervical / Trunk Assessment Cervical / Trunk Assessment: Kyphotic  Communication   Communication: No difficulties  Cognition Arousal/Alertness: Awake/alert Behavior During Therapy: WFL for tasks assessed/performed Overall Cognitive Status: Within Functional Limits for tasks assessed                                        General Comments      Exercises     Assessment/Plan    PT Assessment Patient needs continued PT services  PT Problem List Decreased strength;Decreased activity tolerance;Decreased balance;Decreased mobility       PT Treatment Interventions Therapeutic exercise;Gait training;Stair training;Functional mobility training;Therapeutic activities;Patient/family education    PT Goals (Current goals can be found in the Care Plan section)  Acute Rehab PT Goals Patient Stated Goal: return home after rehab PT Goal Formulation: With patient Time For Goal Achievement: 04/02/19 Potential to Achieve Goals: Good    Frequency Min 5X/week   Barriers to discharge        Co-evaluation               AM-PAC PT "6 Clicks" Mobility  Outcome Measure Help needed turning from your back to your side while in a flat bed without using bedrails?: A Lot Help needed moving from lying on your back to sitting on the side of a flat bed without using bedrails?: A Lot Help needed moving to and from a bed to a chair (including a wheelchair)?: A Lot Help needed standing up from a chair using your arms (e.g., wheelchair or bedside  chair)?: A Lot Help needed to walk in hospital room?: A Lot Help needed climbing 3-5 steps with a railing? : Total 6 Click Score: 11    End of Session   Activity Tolerance: Patient tolerated treatment well;Patient limited by fatigue Patient left: in chair;with call bell/phone within reach;with chair alarm set Nurse Communication: Mobility status PT Visit Diagnosis: Unsteadiness on feet (R26.81);Other abnormalities of gait and mobility (R26.89);Muscle weakness (generalized) (M62.81)    Time: 9518-8416 PT Time Calculation (min) (ACUTE ONLY): 36 min   Charges:   PT Evaluation $PT Eval Moderate Complexity: 1 Mod PT Treatments $Therapeutic Activity: 23-37 mins        11:58 AM, 03/19/19 Lonell Grandchild, MPT Physical Therapist with [facility] [phone] office [phone] mobile phone w

## 2019-03-19 NOTE — Progress Notes (Signed)
PROGRESS NOTE    Betty Carpenter  HYW:[id number] DOB: [birth year] DOA: 03/17/2019 PCP: Dione Housekeeper, MD     Brief Narrative:  85 y.o. female, with medical history significant ofHTN, chronic back pain, chronic opioid use, history of DVT used to be on Xarelto (stopped 1.5 years ago), and breast cancers/pmastectomy, patient was sent from her facility secondary to mechanical fall, and right hip pain, patient cannot recall exact events, but usually she ambulates with a cane, remember she was walking without her cane, and she does report mechanical fall while walking outside, she denies any dizziness, lightheadedness, syncope, loss of consciousness or any focal deficits, denies tingling and numbness; she reports immediate right hip pain after her fall and was transfer to ED. Denies any fever, chest pain, shortness of breath, dysuria, polyuria. - in ED hip x-ray significant for right hip fracture, no significant lab abnormality, she was noted to have sinus tachycardia secondary to significant pain, orthopedic was consulted by ED, [facility] was called to admit   Assessment & Plan: 1- right hip pain: due to hip fracture -s/p ORIF -tolerated poccedure well -will follow orthopedic service post operative rec's -continue PRN pain meds -PT evaluation completed and recommending SNF. -Patient will be started on Xarelto 10 mg daily for DVT prophylaxis. -Continue weightbearing as tolerated -Staples out postoperative day 14 -Repeat x-ray 8 2 weeks, 6 weeks and 12 weeks. -Outpatient follow-up with Dr. Aline Brochure  2-Sinus tachycardia -EKG demonstrating sinus rhythm  -continue pain management and supportive care -will discontinue telemetry; heart rate is well controlled. -follow electrolytes and replete them if needed.  3-HTN -stable -will continue amlodipine   4-chronic back pain -stable overall -continue home PRN pain meds  5-Hx of DVT -completed treatment with xarelto approx 1.5 year ago -After  discussing with orthopedic surgery patient will be started on Xarelto 10 mg daily for prophylaxis.  6-moderate protein calorie malnutrition -follow dietitian rec's for feeding supplement -Body mass index is 18.63 kg/m.  7-hx of breast cancer -s/p mastectomy -appears to be in total remission    DVT prophylaxis: heparin Code Status: Full Code Family Communication: no family at bedside  Disposition Plan: will anticipate discharge back to SNF at discharge for further care and rehab. Follow Hgb trend and overall stability after surgery.  Consultants:   Orthopedic service.  Procedures:   Right hip ORIF 03/18/19  Antimicrobials:  Anti-infectives (From admission, onward)   Start     Dose/Rate Route Frequency Ordered Stop   03/18/19 1600  ceFAZolin (ANCEF) IVPB 2g/100 mL premix     2 g 200 mL/hr over 30 Minutes Intravenous Every 6 hours 03/18/19 1401 03/18/19 2210   03/18/19 1115  ceFAZolin (ANCEF) IVPB 2g/100 mL premix     2 g 200 mL/hr over 30 Minutes Intravenous On call to O.R. 03/18/19 1102 03/18/19 1130     Subjective: Afebrile, no chest pain, no shortness of breath, no nausea, no vomiting.  Still reporting intermittent pain in her hip.  Objective: Vitals:   03/18/19 2114 03/19/19 0118 03/19/19 0531 03/19/19 0813  BP: (!) 155/88 (!) 141/95 (!) 149/86   Pulse: (!) 118 (!) 58 68   Resp: 16 18 18    Temp: 98.5 F (36.9 C) 97.9 F (36.6 C) (!) 97.5 F (36.4 C)   TempSrc: Oral Oral Oral   SpO2: 95% 100% 100% 100%  Weight:      Height:        Intake/Output Summary (Last 24 hours) at 03/19/2019 1303 Last data filed at 03/19/2019  4742 Gross per 24 hour  Intake 1154.05 ml  Output 925 ml  Net 229.05 ml   Filed Weights   03/17/19 1658 03/18/19 1054  Weight: 59 kg 58.9 kg    Examination: General exam: Alert, awake, oriented, no chest pain, no shortness of breath, no nausea, no vomiting. Patient's functioning is well below her baseline and limited for functional mobility  gait due to bilateral lower extremity weakness and right lower extremity pain. Respiratory system: Clear to auscultation. Respiratory effort normal.  Good oxygen saturation on room air. Cardiovascular system: Rate controlled, no rubs, no gallops, no JVD. Gastrointestinal system: Abdomen is nondistended, soft and nontender. No organomegaly or masses felt. Normal bowel sounds heard. Central nervous system: Alert and oriented. No focal neurological deficits. Extremities: No cyanosis, no clubbing.  Right hip with clean dressings in place. Psychiatry: Mood & affect appropriate.   Data Reviewed: I have personally reviewed following labs and imaging studies  CBC: Recent Labs  Lab 03/17/19 1724 03/18/19 0618 03/19/19 0527  WBC 5.1 4.8 5.0  NEUTROABS 4.2  --   --   HGB 12.0 11.1* 10.6*  HCT 38.5 34.7* 33.8*  MCV 93.2 90.8 91.8  PLT 189 167 595   Basic Metabolic Panel: Recent Labs  Lab 03/17/19 1724 03/18/19 0618 03/19/19 0527  NA 139 140 139  K 3.8 3.5 3.5  CL 102 107 104  CO2 26 27 27   GLUCOSE 116* 106* 102*  BUN 21 17 18   CREATININE 0.83 0.65 0.66  CALCIUM 9.0 8.6* 8.3*   GFR: Estimated Creatinine Clearance: 42.6 mL/min (by C-G formula based on SCr of 0.66 mg/dL).  Coagulation Profile: Recent Labs  Lab 03/17/19 1724  INR 1.0   Urine analysis:    Component Value Date/Time   COLORURINE STRAW (A) 02/26/2018 0832   APPEARANCEUR CLEAR 02/26/2018 0832   LABSPEC 1.008 02/26/2018 0832   PHURINE 8.0 02/26/2018 0832   GLUCOSEU NEGATIVE 02/26/2018 0832   HGBUR SMALL (A) 02/26/2018 0832   BILIRUBINUR NEGATIVE 02/26/2018 0832   KETONESUR 5 (A) 02/26/2018 0832   PROTEINUR NEGATIVE 02/26/2018 0832   NITRITE NEGATIVE 02/26/2018 0832   LEUKOCYTESUR NEGATIVE 02/26/2018 0832    Recent Results (from the past 240 hour(s))  Surgical PCR screen     Status: Abnormal   Collection Time: 03/17/19 11:10 PM  Result Value Ref Range Status   MRSA, PCR NEGATIVE NEGATIVE Final    Staphylococcus aureus POSITIVE (A) NEGATIVE Final    Comment: (NOTE) The Xpert SA Assay (FDA approved for NASAL specimens in patients 31 years of age and older), is one component of a comprehensive surveillance program. It is not intended to diagnose infection nor to guide or monitor treatment. Performed at [facility], [address] [place], [place] [address]      Radiology Studies: Dg Chest [facility] 1 View  Result Date: 03/17/2019 CLINICAL DATA:  RIGHT hip fracture post fall, preoperative evaluation EXAM: PORTABLE CHEST 1 VIEW COMPARISON:  Portable exam 2125 hours compared to 10/21/2017 FINDINGS: Normal heart size, mediastinal contours, and pulmonary vascularity. Atherosclerotic calcification aorta. Lungs appear emphysematous but clear. No infiltrate, pleural effusion, or pneumothorax. Diffuse osseous demineralization. Surgical clips RIGHT axilla. IMPRESSION: Emphysematous changes without infiltrate. Electronically Signed   By: Lavonia Dana M.D.   On: 03/17/2019 21:34   Dg Hip Operative Unilat W Or W/o Pelvis Right  Result Date: 03/18/2019 CLINICAL DATA:  Right hip fracture EXAM: OPERATIVE right HIP (WITH PELVIS IF PERFORMED) 6 VIEWS TECHNIQUE: Fluoroscopic spot image(s) were submitted for  interpretation post-operatively. COMPARISON:  03/17/2019 FINDINGS: Multiple C-arm images show placement of 3 Knowles type pins for treatment of a femoral neck fracture. Components appear well positioned without radiographically detectable complication. IMPRESSION: Three Knowles type pins placed for treatment of a femoral neck fracture on the right. Electronically Signed   By: Nelson Chimes M.D.   On: 03/18/2019 13:38   Dg Hip Unilat With Pelvis 2-3 Views Right  Result Date: 03/17/2019 CLINICAL DATA:  [place] today with right hip pain and external rotation. EXAM: DG HIP (WITH OR WITHOUT PELVIS) 2-3V RIGHT COMPARISON:  None. FINDINGS: Impacted femoral neck fracture. No intertrochanteric component seen. Other bones of  the pelvis appear negative. IMPRESSION: Impacted femoral neck fracture on the right. Electronically Signed   By: Nelson Chimes M.D.   On: 03/17/2019 18:39    Scheduled Meds: . amLODipine  5 mg Oral BID  . celecoxib  200 mg Oral BID  . Chlorhexidine Gluconate Cloth  6 each Topical Daily  . docusate sodium  100 mg Oral BID  . feeding supplement (ENSURE ENLIVE)  237 mL Oral BID BM  . lubiprostone  24 mcg Oral BID  . morphine  30 mg Oral BID  . multivitamin  15 mL Oral Daily  . mupirocin ointment  1 application Nasal BID  . rivaroxaban  10 mg Oral Daily  . Vitamin D (Ergocalciferol)  50,000 Units Oral Once per day on Mon Thu   Continuous Infusions: . sodium chloride 50 mL/hr at 03/19/19 0300  . methocarbamol (ROBAXIN) IV       LOS: 2 days    Time spent: 30 minutes.   Barton Dubois, MD [facility] Pager [phone]   03/19/2019, 1:03 PM

## 2019-03-19 NOTE — Plan of Care (Signed)
  Problem: Acute Rehab PT Goals(only PT should resolve) Goal: Pt Will Go Supine/Side To Sit Outcome: Progressing Flowsheets (Taken 03/19/2019 1159) Pt will go Supine/Side to Sit: with minimal assist Goal: Patient Will Transfer Sit To/From Stand Outcome: Progressing Flowsheets (Taken 03/19/2019 1159) Patient will transfer sit to/from stand: with minimal assist Goal: Pt Will Transfer Bed To Chair/Chair To Bed Outcome: Progressing Flowsheets (Taken 03/19/2019 1159) Pt will Transfer Bed to Chair/Chair to Bed: with min assist Goal: Pt Will Ambulate Outcome: Progressing Flowsheets (Taken 03/19/2019 1159) Pt will Ambulate: 25 feet; with minimal assist; with moderate assist; with rolling walker   12:00 PM, 03/19/19 Lonell Grandchild, MPT Physical Therapist with [facility] [phone] office [phone] mobile phone

## 2019-03-20 LAB — CBC
HCT: 36.6 % (ref 36.0–46.0)
Hemoglobin: 11.7 g/dL — ABNORMAL LOW (ref 12.0–15.0)
MCH: 29 pg (ref 26.0–34.0)
MCHC: 32 g/dL (ref 30.0–36.0)
MCV: 90.6 fL (ref 80.0–100.0)
Platelets: 177 10*3/uL (ref 150–400)
RBC: 4.04 MIL/uL (ref 3.87–5.11)
RDW: 13.5 % (ref 11.5–15.5)
WBC: 6 10*3/uL (ref 4.0–10.5)
nRBC: 0 % (ref 0.0–0.2)

## 2019-03-20 LAB — BASIC METABOLIC PANEL
Anion gap: 9 (ref 5–15)
BUN: 13 mg/dL (ref 8–23)
CO2: 27 mmol/L (ref 22–32)
Calcium: 8.6 mg/dL — ABNORMAL LOW (ref 8.9–10.3)
Chloride: 103 mmol/L (ref 98–111)
Creatinine, Ser: 0.61 mg/dL (ref 0.44–1.00)
GFR calc Af Amer: >60 mL/min (ref >60)
GFR calc non Af Amer: >60 mL/min (ref >60)
Glucose, Bld: 120 mg/dL — ABNORMAL HIGH (ref 70–99)
Potassium: 3.7 mmol/L (ref 3.5–5.1)
Sodium: 139 mmol/L (ref 135–145)

## 2019-03-20 MED ORDER — METOPROLOL TARTRATE 25 MG PO TABS
12.5000 mg | ORAL_TABLET | Freq: Two times a day (BID) | ORAL | Status: DC
  Administered 2019-03-20: 04:00:00 12.5 mg via ORAL
  Filled 2019-03-20: qty 1

## 2019-03-20 MED ORDER — AMLODIPINE BESYLATE 5 MG PO TABS
5.0000 mg | ORAL_TABLET | Freq: Every day | ORAL | Status: DC
  Administered 2019-03-20 – 2019-03-22 (×3): 5 mg via ORAL
  Filled 2019-03-20 (×2): qty 1

## 2019-03-20 MED ORDER — METOPROLOL TARTRATE 25 MG PO TABS
25.0000 mg | ORAL_TABLET | Freq: Two times a day (BID) | ORAL | Status: DC
  Administered 2019-03-20 – 2019-03-22 (×5): 25 mg via ORAL
  Filled 2019-03-20 (×5): qty 1

## 2019-03-20 NOTE — Progress Notes (Signed)
Physical Therapy Treatment Patient Details Name: ALAYJA ARMAS MRN: [id number] DOB: 1925-03-12 Today's Date: 03/20/2019    History of Present Illness Betty Carpenter  is a 85 y.o. female s/p CANNULATED HIP PINNING RIGHT HIP 03/18/19 secondary to fall with medical history significant of HTN, chronic back pain, chronic opioid use, history of DVT used to be on Xarelto (stopped 1.5 years ago), and breast cancer s/p mastectomy, patient was sent from her facility secondary to mechanical fall, and right hip pain, patient cannot recall exact events, but usually she ambulates with a cane, remember she was walking without her cane, and she does report mechanical fall while walking outside, she denies any dizziness, lightheadedness, syncope, loss of consciousness or any focal deficits, tingling numbness, she reports immediate right hip pain after her fall and transfer to ED. denies any fever, chest pain, shortness of breath, dysuria, polyuria.    PT Comments    Patient demonstrates slow labored movement for sitting up at bedside requiring much time and assistance, frequently c/o soreness in right hip and low back pain, able to transfer to [facility] to urinate, limited to a few steps at bedside due to weakness and RLE pain.  Patient tolerated sitting up in chair after therapy - nursing staff aware.  Patient will benefit from continued physical therapy in hospital and recommended venue below to increase strength, balance, endurance for safe ADLs and gait.    Follow Up Recommendations  SNF     Equipment Recommendations  None recommended by PT    Recommendations for Other Services       Precautions / Restrictions Precautions Precautions: Fall Restrictions Weight Bearing Restrictions: Yes RLE Weight Bearing: Weight bearing as tolerated    Mobility  Bed Mobility Overal bed mobility: Needs Assistance Bed Mobility: Supine to Sit     Supine to sit: Mod assist     General bed mobility comments: slow labored  movement and assistance to move RLE  Transfers Overall transfer level: Needs assistance Equipment used: Rolling walker (2 wheeled) Transfers: Sit to/from [facility] Sit to Stand: Mod assist Stand pivot transfers: Mod assist       General transfer comment: increased time, labored movement  Ambulation/Gait Ambulation/Gait assistance: Mod assist;Max assist Gait Distance (Feet): 5 Feet Assistive device: Rolling walker (2 wheeled) Gait Pattern/deviations: Decreased step length - right;Decreased step length - left;Decreased stride length;Antalgic Gait velocity: slow   General Gait Details: liimted to 5-6 slow unsteady steps with difficulty advancing RLE due to weakness and pain   Stairs             Wheelchair Mobility    Modified Rankin (Stroke Patients Only)       Balance Overall balance assessment: Needs assistance Sitting-balance support: Feet supported;No upper extremity supported Sitting balance-Leahy Scale: Fair     Standing balance support: Bilateral upper extremity supported;During functional activity Standing balance-Leahy Scale: Poor Standing balance comment: fair/poor using RW                            Cognition Arousal/Alertness: Awake/alert Behavior During Therapy: WFL for tasks assessed/performed Overall Cognitive Status: Within Functional Limits for tasks assessed                                        Exercises General Exercises - Lower Extremity Long Arc Quad: Seated;Strengthening;AAROM;AROM;Both;10 reps Hip Flexion/Marching: AAROM;Seated;Strengthening;Both;10 reps;AROM  Toe Raises: Seated;Strengthening;AROM;Both;10 reps Heel Raises: Seated;Strengthening;AROM;Both;10 reps    General Comments        Pertinent Vitals/Pain Pain Assessment: Faces Faces Pain Scale: Hurts even more Pain Location: low back and right hip with weightbearing Pain Descriptors / Indicators: Aching;Sore;Grimacing Pain  Intervention(s): Limited activity within patient's tolerance;Monitored during session    Home Living                      Prior Function            PT Goals (current goals can now be found in the care plan section) Acute Rehab PT Goals Patient Stated Goal: return home after rehab PT Goal Formulation: With patient Time For Goal Achievement: 04/02/19 Potential to Achieve Goals: Good Progress towards PT goals: Progressing toward goals    Frequency    Min 5X/week      PT Plan Current plan remains appropriate    Co-evaluation              AM-PAC PT "6 Clicks" Mobility   Outcome Measure  Help needed turning from your back to your side while in a flat bed without using bedrails?: A Lot Help needed moving from lying on your back to sitting on the side of a flat bed without using bedrails?: A Lot Help needed moving to and from a bed to a chair (including a wheelchair)?: A Lot Help needed standing up from a chair using your arms (e.g., wheelchair or bedside chair)?: A Lot Help needed to walk in hospital room?: A Lot Help needed climbing 3-5 steps with a railing? : Total 6 Click Score: 11    End of Session   Activity Tolerance: Patient tolerated treatment well;Patient limited by fatigue Patient left: in chair;with call bell/phone within reach Nurse Communication: Mobility status PT Visit Diagnosis: Unsteadiness on feet (R26.81);Other abnormalities of gait and mobility (R26.89);Muscle weakness (generalized) (M62.81)     Time: 1937-9024 PT Time Calculation (min) (ACUTE ONLY): 29 min  Charges:  $Therapeutic Exercise: 8-22 mins $Therapeutic Activity: 8-22 mins                     12:57 PM, 03/20/19 Lonell Grandchild, MPT Physical Therapist with [facility] [phone] office [phone] mobile phone

## 2019-03-20 NOTE — Progress Notes (Signed)
PROGRESS NOTE    Betty Carpenter  VFI:[id number] DOB: 1925/01/08 DOA: 03/17/2019 PCP: Dione Housekeeper, MD     Brief Narrative:  85 y.o. female, with medical history significant ofHTN, chronic back pain, chronic opioid use, history of DVT used to be on Xarelto (stopped 1.5 years ago), and breast cancers/pmastectomy, patient was sent from her facility secondary to mechanical fall, and right hip pain, patient cannot recall exact events, but usually she ambulates with a cane, remember she was walking without her cane, and she does report mechanical fall while walking outside, she denies any dizziness, lightheadedness, syncope, loss of consciousness or any focal deficits, denies tingling and numbness; she reports immediate right hip pain after her fall and was transfer to ED. Denies any fever, chest pain, shortness of breath, dysuria, polyuria. - in ED hip x-ray significant for right hip fracture, no significant lab abnormality, she was noted to have sinus tachycardia secondary to significant pain, orthopedic was consulted by ED, [facility] was called to admit   Assessment & Plan: 1- right hip pain: due to hip fracture -s/p ORIF -tolerated poccedure well -will follow orthopedic service post operative rec's -continue PRN pain meds -PT evaluation completed and recommending SNF. -Patient will be started on Xarelto 10 mg daily for DVT prophylaxis. -Continue weightbearing as tolerated -Staples out postoperative day 14 -Repeat x-ray in 2 weeks, 6 weeks and 12 weeks. -Outpatient follow-up with Dr. Aline Brochure -Social worker has been consulted and assisting with placement; patient has been tested for COVID in anticipation to SNF discharge.  2-Sinus tachycardia -EKG demonstrating sinus rhythm  -continue pain management and supportive care -Heart rate much better controlled now; patient has been started on Lopressor. -follow electrolytes and replete them if needed. -Telemetry was replaced given intermittent  episode of nonsustained sinus tachycardia.  Will observe throughout the rest of the day and discontinue in the morning if heart rate remains stable.  3-HTN -stable -will continue amlodipine   4-chronic back pain -stable overall -continue home PRN pain meds  5-Hx of DVT -completed treatment with xarelto approx 1.5 year ago -After discussing with orthopedic surgery patient will be started on Xarelto 10 mg daily for prophylaxis.  6-moderate protein calorie malnutrition -follow dietitian rec's for feeding supplement -Body mass index is 18.63 kg/m.  7-hx of breast cancer -s/p mastectomy -appears to be in total remission    DVT prophylaxis: heparin Code Status: Full Code Family Communication: no family at bedside  Disposition Plan: will anticipate discharge back to SNF at discharge for further care and rehab. Follow Hgb trend and overall stability after surgery.  Consultants:   Orthopedic service.  Procedures:   Right hip ORIF 03/18/19  Antimicrobials:  Anti-infectives (From admission, onward)   Start     Dose/Rate Route Frequency Ordered Stop   03/18/19 1600  ceFAZolin (ANCEF) IVPB 2g/100 mL premix     2 g 200 mL/hr over 30 Minutes Intravenous Every 6 hours 03/18/19 1401 03/18/19 2210   03/18/19 1115  ceFAZolin (ANCEF) IVPB 2g/100 mL premix     2 g 200 mL/hr over 30 Minutes Intravenous On call to O.R. 03/18/19 1102 03/18/19 1130     Subjective: Afebrile, no chest pain, no shortness of breath, no nausea, no vomiting.  Good oxygen saturation on room air.  Overnight experiencing intermittent episode of nonsustained sinus tachycardia.  Patient also continue experiencing intermittent episode of right hip pain.  Objective: Vitals:   03/19/19 1510 03/19/19 2048 03/20/19 0008 03/20/19 0405  BP: (!) 142/84 (!) 147/86 Marland Kitchen)  158/80 (!) 131/49  Pulse: 68 64 84 88  Resp: 18 18 18 18   Temp: (!) 97.3 F (36.3 C) 98.2 F (36.8 C) (!) 97.5 F (36.4 C) 98.2 F (36.8 C)  TempSrc:  Oral Oral Oral Oral  SpO2: 100% 96% 97% 98%  Weight:      Height:        Intake/Output Summary (Last 24 hours) at 03/20/2019 1133 Last data filed at 03/20/2019 6599 Gross per 24 hour  Intake 180 ml  Output 1102 ml  Net -922 ml   Filed Weights   03/17/19 1658 03/18/19 1054  Weight: 59 kg 58.9 kg    Examination: General exam: Alert, awake, oriented, afebrile, no chest pain, no nausea, no shortness of breath.  Reports intermittent episode of right hip pain. Respiratory system: Clear to auscultation. Respiratory effort normal. Cardiovascular system: Sinus tachycardia; no murmurs, rubs, gallops. Gastrointestinal system: Abdomen is nondistended, soft and nontender. No organomegaly or masses felt. Normal bowel sounds heard. Central nervous system: Alert and oriented. No focal neurological deficits. Extremities: No cyanosis or clubbing; right hip pain with clean dressings in place. Psychiatry:Mood & affect appropriate.   Data Reviewed: I have personally reviewed following labs and imaging studies  CBC: Recent Labs  Lab 03/17/19 1724 03/18/19 0618 03/19/19 0527 03/20/19 0623  WBC 5.1 4.8 5.0 6.0  NEUTROABS 4.2  --   --   --   HGB 12.0 11.1* 10.6* 11.7*  HCT 38.5 34.7* 33.8* 36.6  MCV 93.2 90.8 91.8 90.6  PLT 189 167 154 357   Basic Metabolic Panel: Recent Labs  Lab 03/17/19 1724 03/18/19 0618 03/19/19 0527 03/20/19 0623  NA 139 140 139 139  K 3.8 3.5 3.5 3.7  CL 102 107 104 103  CO2 26 27 27 27   GLUCOSE 116* 106* 102* 120*  BUN 21 17 18 13   CREATININE 0.83 0.65 0.66 0.61  CALCIUM 9.0 8.6* 8.3* 8.6*   GFR: Estimated Creatinine Clearance: 42.6 mL/min (by C-G formula based on SCr of 0.61 mg/dL).  Coagulation Profile: Recent Labs  Lab 03/17/19 1724  INR 1.0   Urine analysis:    Component Value Date/Time   COLORURINE STRAW (A) 02/26/2018 0832   APPEARANCEUR CLEAR 02/26/2018 0832   LABSPEC 1.008 02/26/2018 0832   PHURINE 8.0 02/26/2018 0832   GLUCOSEU NEGATIVE  02/26/2018 0832   HGBUR SMALL (A) 02/26/2018 0832   BILIRUBINUR NEGATIVE 02/26/2018 0832   KETONESUR 5 (A) 02/26/2018 0832   PROTEINUR NEGATIVE 02/26/2018 0832   NITRITE NEGATIVE 02/26/2018 0832   LEUKOCYTESUR NEGATIVE 02/26/2018 0832    Recent Results (from the past 240 hour(s))  Surgical PCR screen     Status: Abnormal   Collection Time: 03/17/19 11:10 PM  Result Value Ref Range Status   MRSA, PCR NEGATIVE NEGATIVE Final   Staphylococcus aureus POSITIVE (A) NEGATIVE Final    Comment: (NOTE) The Xpert SA Assay (FDA approved for NASAL specimens in patients 78 years of age and older), is one component of a comprehensive surveillance program. It is not intended to diagnose infection nor to guide or monitor treatment. Performed at [facility], [address], [place], [place] [address]      Radiology Studies: Dg Hip Operative Unilat W Or W/o Pelvis Right  Result Date: 03/18/2019 CLINICAL DATA:  Right hip fracture EXAM: OPERATIVE right HIP (WITH PELVIS IF PERFORMED) 6 VIEWS TECHNIQUE: Fluoroscopic spot image(s) were submitted for interpretation post-operatively. COMPARISON:  03/17/2019 FINDINGS: Multiple C-arm images show placement of 3 Knowles type pins  for treatment of a femoral neck fracture. Components appear well positioned without radiographically detectable complication. IMPRESSION: Three Knowles type pins placed for treatment of a femoral neck fracture on the right. Electronically Signed   By: Nelson Chimes M.D.   On: 03/18/2019 13:38    Scheduled Meds: . amLODipine  5 mg Oral Daily  . celecoxib  200 mg Oral BID  . Chlorhexidine Gluconate Cloth  6 each Topical Daily  . docusate sodium  100 mg Oral BID  . feeding supplement (ENSURE ENLIVE)  237 mL Oral BID BM  . lubiprostone  24 mcg Oral BID  . metoprolol tartrate  25 mg Oral BID  . morphine  30 mg Oral BID  . multivitamin  15 mL Oral Daily  . mupirocin ointment  1 application Nasal BID  . rivaroxaban  10 mg Oral Daily  .  Vitamin D (Ergocalciferol)  50,000 Units Oral Once per day on Mon Thu   Continuous Infusions: . sodium chloride 50 mL/hr at 03/20/19 0118  . methocarbamol (ROBAXIN) IV       LOS: 3 days    Time spent: 30 minutes.   Barton Dubois, MD [facility] Pager [phone]   03/20/2019, 11:33 AM

## 2019-03-20 NOTE — Progress Notes (Signed)
RN paged Dr. Darrick Meigs with results of EKG, awaiting response.  P.J. Linus Mako, RN

## 2019-03-20 NOTE — Progress Notes (Signed)
Betty Carpenter from [place] called RN earlier in shift to make RN aware that patient's HR has been going into 140's-160's, not sustained.  Betty Carpenter states she does not think this is all real, as strip looks junky.  RN assessed patient who was in pain.  RN repositioned patient and gave pain medication and HR appeared to decrease.  Betty Carpenter called RN again and reported HR now spiking into 160's and 170's and appears more frequent.  RN paged Dr. Darrick Meigs to make him aware and order received for stat EKG.  RN called RT to obtain EKG.  RN will continue to monitor patient and make MD aware of any changes.  P.J. Linus Mako, RN

## 2019-03-20 NOTE — Progress Notes (Signed)
RN paged Betty Corpus, NP to make her aware patient states she has to urinate, but can't start stream.  Bladder scan reveals 379 ml urine in bladder, awaiting response.  P.J. Linus Mako, RN

## 2019-03-21 ENCOUNTER — Encounter (HOSPITAL_COMMUNITY): Payer: Self-pay | Admitting: Orthopedic Surgery

## 2019-03-21 LAB — CBC
HCT: 34.4 % — ABNORMAL LOW (ref 36.0–46.0)
Hemoglobin: 11 g/dL — ABNORMAL LOW (ref 12.0–15.0)
MCH: 29.3 pg (ref 26.0–34.0)
MCHC: 32 g/dL (ref 30.0–36.0)
MCV: 91.5 fL (ref 80.0–100.0)
Platelets: 172 10*3/uL (ref 150–400)
RBC: 3.76 MIL/uL — ABNORMAL LOW (ref 3.87–5.11)
RDW: 13.6 % (ref 11.5–15.5)
WBC: 4.8 10*3/uL (ref 4.0–10.5)
nRBC: 0 % (ref 0.0–0.2)

## 2019-03-21 LAB — NOVEL CORONAVIRUS, NAA (HOSP ORDER, SEND-OUT TO REF LAB; TAT 18-24 HRS): SARS-CoV-2, NAA: NOT DETECTED

## 2019-03-21 NOTE — Progress Notes (Signed)
PROGRESS NOTE    Betty Carpenter  XHB:[id number] DOB: Oct 25, 1925 DOA: 03/17/2019 PCP: Dione Housekeeper, MD     Brief Narrative:  85 y.o. female, with medical history significant ofHTN, chronic back pain, chronic opioid use, history of DVT used to be on Xarelto (stopped 1.5 years ago), and breast cancers/pmastectomy, patient was sent from her facility secondary to mechanical fall, and right hip pain, patient cannot recall exact events, but usually she ambulates with a cane, remember she was walking without her cane, and she does report mechanical fall while walking outside, she denies any dizziness, lightheadedness, syncope, loss of consciousness or any focal deficits, denies tingling and numbness; she reports immediate right hip pain after her fall and was transfer to ED. Denies any fever, chest pain, shortness of breath, dysuria, polyuria. - in ED hip x-ray significant for right hip fracture, no significant lab abnormality, she was noted to have sinus tachycardia secondary to significant pain, orthopedic was consulted by ED, [facility] was called to admit   Assessment & Plan: 1- right hip pain: due to hip fracture -s/p ORIF -tolerated poccedure well -will follow orthopedic service post operative rec's -continue PRN pain meds -PT evaluation completed and recommending SNF. -Patient will be started on Xarelto 10 mg daily for DVT prophylaxis. -Continue weightbearing as tolerated -Staples out postoperative day 14 -Repeat x-ray in 2 weeks, 6 weeks and 12 weeks. -Outpatient follow-up with Dr. Aline Brochure -Social worker has been consulted and assisting with placement; patient has been tested for COVID in anticipation to SNF discharge; waiting on results now. Most likely SNF in am.  2-Sinus tachycardia -EKG demonstrating sinus rhythm  -continue pain management and supportive care -Heart rate much better controlled now; patient has been started on Lopressor. -follow electrolytes and replete them if needed.  -Will discontinue telemetry.  3-HTN -stable -will continue amlodipine   4-chronic back pain -stable overall -continue home PRN pain meds  5-Hx of DVT -completed treatment with xarelto approx 1.5 year ago -After discussing with orthopedic surgery patient will be started on Xarelto 10 mg daily for prophylaxis.  6-moderate protein calorie malnutrition -follow dietitian rec's for feeding supplement -Body mass index is 18.63 kg/m.  7-hx of breast cancer -s/p mastectomy -appears to be in total remission  8-urinary retention -Patient experience overnight urinary retention episode -Appears to be resolved after in and out cath x1 -So far voiding without problems.    DVT prophylaxis: heparin Code Status: Full Code Family Communication: no family at bedside  Disposition Plan: will anticipate discharge back to SNF at discharge for further care and rehab. Follow Hgb trend and overall stability after surgery.  Just waiting results of COVID-19 test prior to discharge.  Consultants:   Orthopedic service.  Procedures:   Right hip ORIF 03/18/19  Antimicrobials:  Anti-infectives (From admission, onward)   Start     Dose/Rate Route Frequency Ordered Stop   03/18/19 1600  ceFAZolin (ANCEF) IVPB 2g/100 mL premix     2 g 200 mL/hr over 30 Minutes Intravenous Every 6 hours 03/18/19 1401 03/18/19 2210   03/18/19 1115  ceFAZolin (ANCEF) IVPB 2g/100 mL premix     2 g 200 mL/hr over 30 Minutes Intravenous On call to O.R. 03/18/19 1102 03/18/19 1130     Subjective: No fever, no chest pain, no shortness of breath, no nausea, no vomiting.  Good oxygen saturation on room air.  Overnight with episode of urinary retention that and the pain resolve by in and out cath.  Stable hemoglobin and no  acute complaints.  Still experiencing intermittent pain on her right hip.  Objective: Vitals:   03/20/19 2021 03/20/19 2126 03/21/19 0548 03/21/19 1415  BP:  128/66 137/73 118/64  Pulse:  75 73 62   Resp:  18 20 18   Temp:  98.4 F (36.9 C) 97.8 F (36.6 C) 97.6 F (36.4 C)  TempSrc:  Oral Oral Oral  SpO2: 93% 97% 98% 96%  Weight:      Height:        Intake/Output Summary (Last 24 hours) at 03/21/2019 1512 Last data filed at 03/21/2019 0900 Gross per 24 hour  Intake 3076.19 ml  Output 1550 ml  Net 1526.19 ml   Filed Weights   03/17/19 1658 03/18/19 1054  Weight: 59 kg 58.9 kg    Examination: General exam: Alert, awake, oriented; afebrile, no chest pain, no nausea, no vomiting, no shortness of breath.  Good oxygen saturation on room air.  Overnight experience episode of urinary retention has resolved after in and out catheterization x1.  Since then patient has been voiding without complaints.  Still reporting intermittent episode of right hip pain. Respiratory system: Clear to auscultation. Respiratory effort normal. Cardiovascular system:RRR. No murmurs, rubs, gallops. Gastrointestinal system: Abdomen is nondistended, soft and nontender. No organomegaly or masses felt. Normal bowel sounds heard. Central nervous system: Alert and oriented. No focal neurological deficits. Extremities: No cyanosis or clubbing; still reporting right hip pain; clean dressings in place. Psychiatry: Mood & affect appropriate.    Data Reviewed: I have personally reviewed following labs and imaging studies  CBC: Recent Labs  Lab 03/17/19 1724 03/18/19 0618 03/19/19 0527 03/20/19 0623 03/21/19 0521  WBC 5.1 4.8 5.0 6.0 4.8  NEUTROABS 4.2  --   --   --   --   HGB 12.0 11.1* 10.6* 11.7* 11.0*  HCT 38.5 34.7* 33.8* 36.6 34.4*  MCV 93.2 90.8 91.8 90.6 91.5  PLT 189 167 154 177 258   Basic Metabolic Panel: Recent Labs  Lab 03/17/19 1724 03/18/19 0618 03/19/19 0527 03/20/19 0623  NA 139 140 139 139  K 3.8 3.5 3.5 3.7  CL 102 107 104 103  CO2 26 27 27 27   GLUCOSE 116* 106* 102* 120*  BUN 21 17 18 13   CREATININE 0.83 0.65 0.66 0.61  CALCIUM 9.0 8.6* 8.3* 8.6*   GFR: Estimated  Creatinine Clearance: 42.6 mL/min (by C-G formula based on SCr of 0.61 mg/dL).  Coagulation Profile: Recent Labs  Lab 03/17/19 1724  INR 1.0   Urine analysis:    Component Value Date/Time   COLORURINE STRAW (A) 02/26/2018 0832   APPEARANCEUR CLEAR 02/26/2018 0832   LABSPEC 1.008 02/26/2018 0832   PHURINE 8.0 02/26/2018 0832   GLUCOSEU NEGATIVE 02/26/2018 0832   HGBUR SMALL (A) 02/26/2018 0832   BILIRUBINUR NEGATIVE 02/26/2018 0832   KETONESUR 5 (A) 02/26/2018 0832   PROTEINUR NEGATIVE 02/26/2018 0832   NITRITE NEGATIVE 02/26/2018 0832   LEUKOCYTESUR NEGATIVE 02/26/2018 0832    Recent Results (from the past 240 hour(s))  Surgical PCR screen     Status: Abnormal   Collection Time: 03/17/19 11:10 PM  Result Value Ref Range Status   MRSA, PCR NEGATIVE NEGATIVE Final   Staphylococcus aureus POSITIVE (A) NEGATIVE Final    Comment: (NOTE) The Xpert SA Assay (FDA approved for NASAL specimens in patients 67 years of age and older), is one component of a comprehensive surveillance program. It is not intended to diagnose infection nor to guide or monitor treatment. Performed at [facility]  [facility], [address], [place], [place] [address]      Radiology Studies: No results found.  Scheduled Meds: . amLODipine  5 mg Oral Daily  . celecoxib  200 mg Oral BID  . Chlorhexidine Gluconate Cloth  6 each Topical Daily  . docusate sodium  100 mg Oral BID  . feeding supplement (ENSURE ENLIVE)  237 mL Oral BID BM  . lubiprostone  24 mcg Oral BID  . metoprolol tartrate  25 mg Oral BID  . morphine  30 mg Oral BID  . multivitamin  15 mL Oral Daily  . mupirocin ointment  1 application Nasal BID  . rivaroxaban  10 mg Oral Daily  . Vitamin D (Ergocalciferol)  50,000 Units Oral Once per day on Mon Thu   Continuous Infusions: . sodium chloride 50 mL/hr at 03/21/19 0636  . methocarbamol (ROBAXIN) IV       LOS: 4 days    Time spent: 25 minutes.   Barton Dubois, MD [facility]  Pager [phone]   03/21/2019, 3:12 PM

## 2019-03-21 NOTE — Evaluation (Signed)
Occupational Therapy Evaluation Patient Details Name: Betty Carpenter MRN: [id number] DOB: 1924/12/11 Today's Date: 03/21/2019    History of Present Illness Betty Carpenter  is a 85 y.o. female s/p CANNULATED HIP PINNING RIGHT HIP 03/18/19 secondary to fall with medical history significant of HTN, chronic back pain, chronic opioid use, history of DVT used to be on Xarelto (stopped 1.5 years ago), and breast cancer s/p mastectomy, patient was sent from her facility secondary to mechanical fall, and right hip pain, patient cannot recall exact events, but usually she ambulates with a cane, remember she was walking without her cane, and she does report mechanical fall while walking outside, she denies any dizziness, lightheadedness, syncope, loss of consciousness or any focal deficits, tingling numbness, she reports immediate right hip pain after her fall and transfer to ED. denies any fever, chest pain, shortness of breath, dysuria, polyuria.   Clinical Impression   Patient in bed upon therapy arrival and agreeable to participate in OT evaluation. Pt complaining of right hip pain in bed. Meds requested although not provided during session. Patient presents with decreased BUE strength and endurance, decreased functional transfers and decreased ADL performance requiring increased physical assistance to complete daily tasks and transfers. Patient will benefit from skilled OT services to focus on mentioned deficits while in acute care. Recommend discharge to SNF prior to returning home due to level of assistance needed at this time. Patient was left in bed after toileting as requested due to level of pain.     Follow Up Recommendations  SNF    Equipment Recommendations  None recommended by OT       Precautions / Restrictions Precautions Precautions: Fall Restrictions Weight Bearing Restrictions: Yes RLE Weight Bearing: Weight bearing as tolerated      Mobility Bed Mobility Overal bed mobility: Needs  Assistance Bed Mobility: Supine to Sit;Sit to Supine     Supine to sit: Supervision;HOB elevated Sit to supine: Max assist      Transfers Overall transfer level: Needs assistance Equipment used: Rolling walker (2 wheeled)             General transfer comment: Max assist with RW. VC for technique and sequencing as well as safety cues.         ADL either performed or assessed with clinical judgement   ADL Overall ADL's : Needs assistance/impaired     Grooming: Wash/dry hands;Set up;Sitting               Lower Body Dressing: Total assistance;Bed level   Toilet Transfer: Maximal assistance;BSC;RW;Ambulation   Toileting- Clothing Manipulation and Hygiene: Maximal assistance;Sitting/lateral lean       Functional mobility during ADLs: Maximal assistance;Rolling walker       Vision Baseline Vision/History: Wears glasses Wears Glasses: At all times Patient Visual Report: No change from baseline              Pertinent Vitals/Pain Pain Assessment: 0-10 Pain Score: 6  Pain Location: right hip Pain Descriptors / Indicators: Aching;Grimacing Pain Intervention(s): Limited activity within patient's tolerance;Monitored during session;Patient requesting pain meds-RN notified     Hand Dominance Right   Extremity/Trunk Assessment Upper Extremity Assessment Upper Extremity Assessment: Generalized weakness   Lower Extremity Assessment Lower Extremity Assessment: Defer to PT evaluation       Communication Communication Communication: No difficulties   Cognition Arousal/Alertness: Awake/alert Behavior During Therapy: WFL for tasks assessed/performed Overall Cognitive Status: Within Functional Limits for tasks assessed  Home Living Family/patient expects to be discharged to:: Skilled nursing facility Living Arrangements: Spouse/significant other Available Help at Discharge: Family Type of Home: House Home Access: [place]: One level           Bathroom Accessibility: Yes   Home Equipment: Environmental consultant - 2 wheels;Wheelchair - manual;Cane - single point          Prior Functioning/Environment Level of Independence: Independent with assistive device(s)        Comments: household ambulator with RW        OT Problem List: Decreased strength;Decreased knowledge of use of DME or AE;Pain;Impaired balance (sitting and/or standing);Decreased activity tolerance      OT Treatment/Interventions: Self-care/ADL training;Modalities;Balance training;Therapeutic exercise;Neuromuscular education;Therapeutic activities;Energy conservation;DME and/or AE instruction;Manual therapy;Patient/family education    OT Goals(Current goals can be found in the care plan section) Acute Rehab OT Goals Patient Stated Goal: return home after rehab OT Goal Formulation: With patient Time For Goal Achievement: 04/04/19 Potential to Achieve Goals: Good  OT Frequency: Min 2X/week   Barriers to D/C: Decreased caregiver support             AM-PAC OT "6 Clicks" Daily Activity     Outcome Measure Help from another person eating meals?: None Help from another person taking care of personal grooming?: A Little Help from another person toileting, which includes using toliet, bedpan, or urinal?: A Lot Help from another person bathing (including washing, rinsing, drying)?: A Lot Help from another person to put on and taking off regular upper body clothing?: A Little Help from another person to put on and taking off regular lower body clothing?: Total 6 Click Score: 15   End of Session Equipment Utilized During Treatment: Gait belt;Rolling walker Nurse Communication: Patient requests pain meds  Activity Tolerance: Patient limited by pain;Patient tolerated treatment well Patient left: in bed;with call bell/phone within reach;with bed alarm set  OT Visit Diagnosis: Muscle weakness (generalized) (M62.81)                 Time: 8299-3716 OT Time Calculation (min): 23 min Charges:  OT General Charges $OT Visit: 1 Visit OT Evaluation $OT Eval Moderate Complexity: [place], OTR/L,CBIS  [phone]   [place], Clarene Duke 03/21/2019, 9:24 AM

## 2019-03-21 NOTE — Plan of Care (Signed)
  Problem: Acute Rehab OT Goals (only OT should resolve) Goal: Pt. Will Perform Grooming Flowsheets (Taken 03/21/2019 0927) Pt Will Perform Grooming: with supervision; standing Goal: Pt. Will Perform Upper Body Bathing Flowsheets (Taken 03/21/2019 0927) Pt Will Perform Upper Body Bathing: with set-up; sitting Goal: Pt. Will Perform Upper Body Dressing Flowsheets (Taken 03/21/2019 0927) Pt Will Perform Upper Body Dressing: with set-up; sitting Goal: Pt. Will Transfer To Toilet Flowsheets (Taken 03/21/2019 0927) Pt Will Transfer to Toilet: with min assist; ambulating; regular height toilet; bedside commode Goal: Pt. Will Perform Toileting-Clothing Manipulation Flowsheets (Taken 03/21/2019 0927) Pt Will Perform Toileting - Clothing Manipulation and hygiene: with min guard assist; sitting/lateral leans; sit to/from stand Goal: Pt/Caregiver Will Perform Home Exercise Program Flowsheets (Taken 03/21/2019 [id number]) Pt/caregiver will Perform Home Exercise Program: Increased strength; Both right and left upper extremity; With Supervision; With written HEP provided

## 2019-03-21 NOTE — Evaluation (Signed)
Clinical/Bedside Swallow Evaluation Patient Details  Name: Betty Carpenter MRN: [id number] Date of Birth: 1925-09-10  Today's Date: 03/21/2019 Time: SLP Start Time (ACUTE ONLY): 42 SLP Stop Time (ACUTE ONLY): 1100 SLP Time Calculation (min) (ACUTE ONLY): 24 min  Past Medical History:  Past Medical History:  Diagnosis Date  . Arthritis   . Breast cancer ([place])   . Chronic back pain   . Closed wedge compression fracture of L1 vertebra ([place]) 2004  . Hypertension   . Sciatica    Past Surgical History:  Past Surgical History:  Procedure Laterality Date  . APPENDECTOMY    . HIP PINNING,CANNULATED Right 03/18/2019   Procedure: CANNULATED HIP PINNING;  Surgeon: Carole Civil, MD;  Location: [facility];  Service: Orthopedics;  Laterality: Right;  11 am    HPI:  85 y.o.female,with medical history significant ofHTN, chronic back pain, chronic opioid use,history ofDVTused to be onXarelto (stopped 1.5 years ago), and breast cancers/pmastectomy,patient was sent from her facility secondary to mechanical fall, and right hip pain, patient cannot recall exact events, but usually she ambulates with a cane, remember she was walking without her cane, and she does report mechanical fall while walking outside, she denies any dizziness, lightheadedness, syncope, loss of consciousness or any focal deficits, denies tingling and numbness; she reports immediate right hip pain after her fall and was transfer to ED.Denies any fever, chest pain, shortness of breath, dysuria, polyuria. In EDhip x-ray significant for right hip fracture, no significant lab abnormality, she was noted to have sinus tachycardia secondary to significant pain, orthopedic was consulted by ED, [facility] was called to admit. Pt s/p ORIF 03/18/19. BSE requested. RN reports that Pt is tolerating diet well.   Assessment / Plan / Recommendation Clinical Impression  Clinical swallow evaluation completed s/p ORIF in Pt with dementia. RN reports good  toleration of diet. Pt seen sitting up in chair. Oral motor evaluation is WNL. Pt edentulous, but states she wears upper dentures (able to eat without them). SLP unable to locate in room and RN unsure if Pt has them here. Pt self presented po trials and exhibited no overt signs or symptoms of aspiration/decreased airway protection. Pt able to masticate graham crackers without dentures, but letting them sit in her mouth briefly to soften. Will change to D3/mech soft due to edentulous status, however can be upgraded to regular textures post discharge and with upper dentures if Pt desires. Above to RN. PO medications whole with water. SLP will sign off at this time. Reconsult as indicated.   SLP Visit Diagnosis: Dysphagia, unspecified (R13.10)    Aspiration Risk  No limitations    Diet Recommendation Dysphagia 3 (Mech soft);Thin liquid(due to edentulous status will change to mech soft)   Liquid Administration via: Cup;Straw Medication Administration: Whole meds with liquid Supervision: Patient able to self feed;Intermittent supervision to cue for compensatory strategies Postural Changes: Seated upright at 90 degrees;Remain upright for at least 30 minutes after po intake    Other  Recommendations Oral Care Recommendations: Oral care BID;Staff/trained caregiver to provide oral care Other Recommendations: Clarify dietary restrictions   Follow up Recommendations None      Frequency and Duration            Prognosis Prognosis for Safe Diet Advancement: Good Barriers to Reach Goals: Cognitive deficits      Swallow Study   General Date of Onset: 03/17/19 HPI: 85 y.o.female,with medical history significant ofHTN, chronic back pain, chronic opioid use,history ofDVTused to be onXarelto (stopped  1.5 years ago), and breast cancers/pmastectomy,patient was sent from her facility secondary to mechanical fall, and right hip pain, patient cannot recall exact events, but usually she ambulates  with a cane, remember she was walking without her cane, and she does report mechanical fall while walking outside, she denies any dizziness, lightheadedness, syncope, loss of consciousness or any focal deficits, denies tingling and numbness; she reports immediate right hip pain after her fall and was transfer to ED.Denies any fever, chest pain, shortness of breath, dysuria, polyuria. In EDhip x-ray significant for right hip fracture, no significant lab abnormality, she was noted to have sinus tachycardia secondary to significant pain, orthopedic was consulted by ED, [facility] was called to admit. Pt s/p ORIF 03/18/19. BSE requested. RN reports that Pt is tolerating diet well. Type of Study: Bedside Swallow Evaluation Previous Swallow Assessment: None on record Diet Prior to this Study: Regular;Thin liquids Temperature Spikes Noted: No Respiratory Status: Room air History of Recent Intubation: No Behavior/Cognition: Alert;Cooperative;Pleasant mood Oral Cavity Assessment: Within Functional Limits Oral Care Completed by SLP: Yes Oral Cavity - Dentition: Dentures, not available;Dentures, top;Edentulous Vision: Functional for self-feeding Self-Feeding Abilities: Able to feed self Patient Positioning: Upright in chair Baseline Vocal Quality: Normal Volitional Cough: Strong Volitional Swallow: Able to elicit    Oral/Motor/Sensory Function Overall Oral Motor/Sensory Function: Within functional limits   Ice Chips Ice chips: Within functional limits Presentation: Spoon   Thin Liquid Thin Liquid: Within functional limits Presentation: Straw;Self Fed    Nectar Thick Nectar Thick Liquid: Not tested   Honey Thick Honey Thick Liquid: Not tested   Puree Puree: Within functional limits Presentation: Spoon   Solid     Solid: Within functional limits Presentation: Self Fed     Thank you,  Genene Churn, [place]  [place] 03/21/2019,11:03 AM

## 2019-03-21 NOTE — Care Management Important Message (Signed)
Important Message  Patient Details  Name: Betty Carpenter MRN: [id number] Date of Birth: July 09, 1925   Medicare Important Message Given:  Yes    Tommy Medal 03/21/2019, 1:34 PM

## 2019-03-21 NOTE — Progress Notes (Signed)
Physical Therapy Treatment Patient Details Name: Betty Carpenter MRN: [id number] DOB: 08-01-1925 Today's Date: 03/21/2019    History of Present Illness Betty Carpenter  is a 85 y.o. female s/p CANNULATED HIP PINNING RIGHT HIP 03/18/19 secondary to fall with medical history significant of HTN, chronic back pain, chronic opioid use, history of DVT used to be on Xarelto (stopped 1.5 years ago), and breast cancer s/p mastectomy, patient was sent from her facility secondary to mechanical fall, and right hip pain, patient cannot recall exact events, but usually she ambulates with a cane, remember she was walking without her cane, and she does report mechanical fall while walking outside, she denies any dizziness, lightheadedness, syncope, loss of consciousness or any focal deficits, tingling numbness, she reports immediate right hip pain after her fall and transfer to ED. denies any fever, chest pain, shortness of breath, dysuria, polyuria.    PT Comments    Patient demonstrates increased endurance/distance for gait training, ambulated up to doorway and back to bedside, had difficulty weightbearing on RLE due to increased hip pain and required much time to make turns using RW.  Patient demonstrated increased tolerance for completing exercises with RLE with fair/good carryover and tolerated sitting up in chair after therapy.  Patient will benefit from continued physical therapy in hospital and recommended venue below to increase strength, balance, endurance for safe ADLs and gait.    Follow Up Recommendations  SNF     Equipment Recommendations  None recommended by PT    Recommendations for Other Services       Precautions / Restrictions Precautions Precautions: Fall Restrictions Weight Bearing Restrictions: Yes RLE Weight Bearing: Weight bearing as tolerated    Mobility  Bed Mobility Overal bed mobility: Needs Assistance Bed Mobility: Supine to Sit     Supine to sit: Min assist;Mod assist Sit to  supine: Max assist   General bed mobility comments: slow labored movement and assistance to move RLE  Transfers Overall transfer level: Needs assistance Equipment used: Rolling walker (2 wheeled) Transfers: Sit to/from [facility] Sit to Stand: Mod assist Stand pivot transfers: Mod assist       General transfer comment: increased time, slow labored movement  Ambulation/Gait Ambulation/Gait assistance: Mod assist Gait Distance (Feet): 20 Feet Assistive device: Rolling walker (2 wheeled) Gait Pattern/deviations: Decreased step length - right;Decreased stance time - right;Decreased stride length;Antalgic Gait velocity: slow   General Gait Details: increased endurance/distance for ambulation with slow labored cadence, difficulty weightbearing on RLE due to pain and required much time to make turns using RW due to generalized weakness/right hip pain   Stairs             Wheelchair Mobility    Modified Rankin (Stroke Patients Only)       Balance Overall balance assessment: Needs assistance Sitting-balance support: Feet supported;No upper extremity supported   Sitting balance - Comments: fair/good   Standing balance support: Bilateral upper extremity supported;During functional activity Standing balance-Leahy Scale: Fair Standing balance comment: fair using RW, tends to lean backwards when initially standing                            Cognition Arousal/Alertness: Awake/alert Behavior During Therapy: WFL for tasks assessed/performed Overall Cognitive Status: Within Functional Limits for tasks assessed  Exercises Total Joint Exercises Ankle Circles/Pumps: AROM;Both;Strengthening;Supine;10 reps Quad Sets: Supine;AROM;Strengthening;Both;10 reps Short Arc Quad: Supine;AROM;Strengthening;Right;10 reps Heel Slides: Supine;AROM;Strengthening;Both;10 reps Long Arc Quad:  Seated;AROM;AAROM;Strengthening;Both;10 reps General Exercises - Lower Extremity Hip Flexion/Marching: Seated;AROM;Strengthening;Both;10 reps    General Comments        Pertinent Vitals/Pain Pain Assessment: No/denies pain Pain Score: 6  Pain Location: low back and right hip with movement, weightbearing Pain Descriptors / Indicators: Aching;Sore;Grimacing Pain Intervention(s): Limited activity within patient's tolerance;Monitored during session;Patient requesting pain meds-RN notified    Home Living Family/patient expects to be discharged to:: Skilled nursing facility Living Arrangements: Spouse/significant other Available Help at Discharge: Family Type of Home: House Home Access: [place]: One [place]: Environmental consultant - 2 wheels;Wheelchair - manual;Cane - single point      Prior Function Level of Independence: Independent with assistive device(s)      Comments: household ambulator with RW   PT Goals (current goals can now be found in the care plan section) Acute Rehab PT Goals Patient Stated Goal: return home after rehab PT Goal Formulation: With patient Time For Goal Achievement: 04/02/19 Potential to Achieve Goals: Good Progress towards PT goals: Progressing toward goals    Frequency    Min 5X/week      PT Plan Current plan remains appropriate    Co-evaluation              AM-PAC PT "6 Clicks" Mobility   Outcome Measure  Help needed turning from your back to your side while in a flat bed without using bedrails?: A Little Help needed moving from lying on your back to sitting on the side of a flat bed without using bedrails?: A Lot Help needed moving to and from a bed to a chair (including a wheelchair)?: A Lot Help needed standing up from a chair using your arms (e.g., wheelchair or bedside chair)?: A Lot Help needed to walk in hospital room?: A Lot Help needed climbing 3-5 steps with a railing? : Total 6 Click Score: 12     End of Session Equipment Utilized During Treatment: Gait belt Activity Tolerance: Patient tolerated treatment well;Patient limited by fatigue Patient left: in chair;with call bell/phone within reach Nurse Communication: Mobility status PT Visit Diagnosis: Unsteadiness on feet (R26.81);Other abnormalities of gait and mobility (R26.89);Muscle weakness (generalized) (M62.81)     Time: 5638-7564 PT Time Calculation (min) (ACUTE ONLY): 34 min  Charges:  $Therapeutic Exercise: 8-22 mins $Therapeutic Activity: 8-22 mins                     11:12 AM, 03/21/19 Lonell Grandchild, MPT Physical Therapist with [facility] [phone] office [phone] mobile phone

## 2019-03-21 NOTE — NC FL2 (Signed)
[place] MEDICAID FL2 LEVEL OF CARE SCREENING TOOL     IDENTIFICATION  Patient Name: Betty Carpenter Birthdate: May 09, 1925 Sex: female Admission Date (Current Location): 03/17/2019  [facility] and [place] Number:  [employer] and Address:  [place] [address], [place]      Provider Number: [id number]  Attending Physician Name and Address:  Barton Dubois, MD  Relative Name and Phone Number:  [name]  [phone]    Current Level of Care: Hospital Recommended Level of Care: Rule Prior Approval Number:    Date Approved/Denied:   PASRR Number: [id number]  Discharge Plan: SNF    Current Diagnoses: Patient Active Problem List   Diagnosis Date Noted  . Pressure injury of skin 03/18/2019  . Closed right hip fracture ([place]) 03/17/2019  . Sinus tachycardia 03/17/2019  . History of breast cancer 10/23/2017  . History of DVT (deep vein thrombosis) 10/23/2017  . Chronic back pain 10/23/2017  . Benign essential HTN 10/23/2017  . Hypertensive urgency 10/23/2017  . Chronic, continuous use of opioids 10/23/2017  . Sepsis ([place]) 10/22/2017  . Breast cancer, stage 1 ([place]) 01/28/2012    Orientation RESPIRATION BLADDER Height & Weight     Self, Time, Situation, Place  Normal Continent Weight: 58.9 kg Height:  5\' 10"  (177.8 cm)  BEHAVIORAL SYMPTOMS/MOOD NEUROLOGICAL BOWEL NUTRITION STATUS  (N/A) (N/A) Continent Diet  AMBULATORY STATUS COMMUNICATION OF NEEDS Skin   Extensive Assist Verbally Skin abrasions, PU Stage and Appropriate Care(abrasion to bilateral arms; stage II pressure ulcer to Right lateral Ankle)   PU Stage 2 Dressing: Daily                   Personal Care Assistance Level of Assistance  Bathing, Feeding, Dressing Bathing Assistance: Maximum assistance Feeding assistance: Independent Dressing Assistance: Maximum assistance     Functional Limitations Info  Sight, Hearing, Speech Sight Info:  Adequate Hearing Info: Adequate Speech Info: Adequate    SPECIAL CARE FACTORS FREQUENCY  PT (By licensed PT)     PT Frequency: 5days/week              Contractures Contractures Info: Not present    Additional Factors Info  Code Status, Allergies, Psychotropic Code Status Info: full Allergies Info: Cymbalta Psychotropic Info: Ativan as needed for anxiety         Current Medications (03/21/2019):  This is the current hospital active medication list Current Facility-Administered Medications  Medication Dose Route Frequency Provider Last Rate Last Dose  . 0.9 %  sodium chloride infusion   Intravenous Continuous Carole Civil, MD 50 mL/hr at 03/21/19 0636    . amLODipine (NORVASC) tablet 5 mg  5 mg Oral Daily Barton Dubois, MD   5 mg at 03/21/19 0810  . celecoxib (CELEBREX) capsule 200 mg  200 mg Oral BID Carole Civil, MD   200 mg at 03/21/19 0810  . Chlorhexidine Gluconate Cloth 2 % PADS 6 each  6 each Topical Daily Carole Civil, MD   6 each at 03/21/19 [phone]  . docusate sodium (COLACE) capsule 100 mg  100 mg Oral BID Carole Civil, MD   100 mg at 03/21/19 0809  . feeding supplement (ENSURE ENLIVE) (ENSURE ENLIVE) liquid 237 mL  237 mL Oral BID BM Barton Dubois, MD   237 mL at 03/21/19 0810  . LORazepam (ATIVAN) tablet 0.5 mg  0.5 mg Oral BID PRN Carole Civil, MD   0.5 mg  at 03/20/19 0105  . lubiprostone (AMITIZA) capsule 24 mcg  24 mcg Oral BID Carole Civil, MD   24 mcg at 03/21/19 [phone]  . menthol-cetylpyridinium (CEPACOL) lozenge 3 mg  1 lozenge Oral PRN Carole Civil, MD       Or  . phenol (CHLORASEPTIC) mouth spray 1 spray  1 spray Mouth/Throat PRN Carole Civil, MD      . methocarbamol (ROBAXIN) tablet 500 mg  500 mg Oral Q6H PRN Carole Civil, MD   500 mg at 03/21/19 0529   Or  . methocarbamol (ROBAXIN) 500 mg in dextrose 5 % 50 mL IVPB  500 mg Intravenous Q6H PRN Carole Civil, MD      . metoCLOPramide  (REGLAN) tablet 5-10 mg  5-10 mg Oral Q8H PRN Carole Civil, MD       Or  . metoCLOPramide (REGLAN) injection 5-10 mg  5-10 mg Intravenous Q8H PRN Carole Civil, MD      . metoprolol tartrate (LOPRESSOR) tablet 25 mg  25 mg Oral BID Barton Dubois, MD   25 mg at 03/21/19 0810  . morphine (MS CONTIN) 12 hr tablet 30 mg  30 mg Oral BID Carole Civil, MD   30 mg at 03/21/19 0809  . morphine 2 MG/ML injection 1 mg  1 mg Intravenous Q3H PRN Carole Civil, MD   1 mg at 03/21/19 1110  . multivitamin liquid 15 mL  15 mL Oral Daily Barton Dubois, MD   15 mL at 03/20/19 1650  . mupirocin ointment (BACTROBAN) 2 % 1 application  1 application Nasal BID Carole Civil, MD   1 application at 27/74/12 [phone]  . ondansetron (ZOFRAN) tablet 4 mg  4 mg Oral Q6H PRN Carole Civil, MD       Or  . ondansetron [facility]) injection 4 mg  4 mg Intravenous Q6H PRN Carole Civil, MD      . polyethylene glycol (MIRALAX / GLYCOLAX) packet 17 g  17 g Oral Daily PRN Carole Civil, MD      . rivaroxaban [facility]) tablet 10 mg  10 mg Oral Daily Barton Dubois, MD   10 mg at 03/21/19 0809  . Vitamin D (Ergocalciferol) (DRISDOL) capsule 50,000 Units  50,000 Units Oral Once per day on Mon Thu Harrison, Stanley E, MD   50,000 Units at 03/21/19 7672     Discharge Medications: Please see discharge summary for a list of discharge medications.  Relevant Imaging Results:  Relevant Lab Results:   Additional Information ssn: [id number]  [place], [place]

## 2019-03-22 ENCOUNTER — Inpatient Hospital Stay
Admission: RE | Admit: 2019-03-22 | Discharge: 2019-04-08 | Disposition: A | Discharge status: Home / Self Care | Payer: Medicare Other | Source: Ambulatory Visit | Attending: Internal Medicine | Admitting: Internal Medicine

## 2019-03-22 DIAGNOSIS — Z8781 Personal history of (healed) traumatic fracture: Secondary | ICD-10-CM

## 2019-03-22 DIAGNOSIS — F419 Anxiety disorder, unspecified: Secondary | ICD-10-CM

## 2019-03-22 MED ORDER — CELECOXIB 200 MG PO CAPS: 200.0000 mg | ORAL_CAPSULE | Freq: Two times a day (BID) | ORAL | Status: DC

## 2019-03-22 MED ORDER — METOPROLOL TARTRATE 25 MG PO TABS: 25.0000 mg | ORAL_TABLET | Freq: Two times a day (BID) | ORAL | Status: DC

## 2019-03-22 MED ORDER — PANTOPRAZOLE SODIUM 20 MG PO TBEC: 20.0000 mg | DELAYED_RELEASE_TABLET | Freq: Every day | ORAL | Status: AC

## 2019-03-22 MED ORDER — OXYCODONE HCL 20 MG PO TABS: 1.0000 | ORAL_TABLET | Freq: Four times a day (QID) | ORAL | 0 refills | Status: DC | PRN

## 2019-03-22 MED ORDER — TIZANIDINE HCL 4 MG PO TABS: 4.0000 mg | ORAL_TABLET | Freq: Three times a day (TID) | ORAL | 0 refills | Status: DC | PRN

## 2019-03-22 MED ORDER — ENSURE ENLIVE PO LIQD: 237.0000 mL | Freq: Two times a day (BID) | ORAL | Status: DC

## 2019-03-22 MED ORDER — LORAZEPAM 0.5 MG PO TABS: 0.5000 mg | ORAL_TABLET | Freq: Two times a day (BID) | ORAL | 0 refills | Status: DC | PRN

## 2019-03-22 MED ORDER — AMLODIPINE BESYLATE 5 MG PO TABS: 5.0000 mg | ORAL_TABLET | Freq: Every day | ORAL | Status: DC

## 2019-03-22 MED ORDER — MORPHINE SULFATE ER 30 MG PO TBCR: 30.0000 mg | EXTENDED_RELEASE_TABLET | Freq: Two times a day (BID) | ORAL | 0 refills | Status: DC

## 2019-03-22 MED ORDER — RIVAROXABAN 10 MG PO TABS: 10.0000 mg | ORAL_TABLET | Freq: Every day | ORAL | Status: DC

## 2019-03-22 NOTE — Progress Notes (Signed)
Occupational Therapy Treatment Patient Details Name: Betty Carpenter MRN: [id number] DOB: 12-04-1924 Today's Date: 03/22/2019    History of present illness Betty Carpenter  is a 85 y.o. female s/p CANNULATED HIP PINNING RIGHT HIP 03/18/19 secondary to fall with medical history significant of HTN, chronic back pain, chronic opioid use, history of DVT used to be on Xarelto (stopped 1.5 years ago), and breast cancer s/p mastectomy, patient was sent from her facility secondary to mechanical fall, and right hip pain, patient cannot recall exact events, but usually she ambulates with a cane, remember she was walking without her cane, and she does report mechanical fall while walking outside, she denies any dizziness, lightheadedness, syncope, loss of consciousness or any focal deficits, tingling numbness, she reports immediate right hip pain after her fall and transfer to ED. denies any fever, chest pain, shortness of breath, dysuria, polyuria.   OT comments  Pt seen with PTA for co-treatment this am, pt did well with bed mobility, increased time required for transfer task. Verbal cuing for sequencing during all tasks. Pt able to perform UB ADLs with set-up and verbal cues, max to total assist with LB tasks. Pt limited due to back pain this am. Discharge to SNF remains appropriate.     Follow Up Recommendations  SNF    Equipment Recommendations  None recommended by OT       Precautions / Restrictions Precautions Precautions: Fall Restrictions Weight Bearing Restrictions: Yes RLE Weight Bearing: Weight bearing as tolerated       Mobility Bed Mobility Overal bed mobility: Needs Assistance Bed Mobility: Supine to Sit     Supine to sit: Min assist     General bed mobility comments: slow labored movement   Transfers Overall transfer level: Needs assistance Equipment used: Rolling walker (2 wheeled) Transfers: Sit to/from [facility] Sit to Stand: Mod assist Stand pivot transfers:  Mod assist       General transfer comment: increased time, slow labored movement; cueing for mechanics        ADL either performed or assessed with clinical judgement   ADL Overall ADL's : Needs assistance/impaired Eating/Feeding: Set up;Sitting                   Lower Body Dressing: Maximal assistance;Sitting/lateral leans   Toilet Transfer: Moderate assistance;Stand-pivot;BSC Toilet Transfer Details (indicate cue type and reason): simulated with bed to chair transfer, consistent verbal cuing for sequencing                           Cognition Arousal/Alertness: Awake/alert Behavior During Therapy: WFL for tasks assessed/performed Overall Cognitive Status: Within Functional Limits for tasks assessed                                                     Pertinent Vitals/ Pain       Pain Assessment: 0-10 Pain Score: 6  Pain Location: low back and right hip with movement, weightbearing Pain Descriptors / Indicators: Aching;Sore;Grimacing Pain Intervention(s): Limited activity within patient's tolerance;Monitored during session;Repositioned;Patient requesting pain meds-RN notified         Frequency  Min 2X/week        Progress Toward Goals  OT Goals(current goals can now be found in the care plan section)  Progress towards OT goals: Progressing  toward goals  Acute Rehab OT Goals Patient Stated Goal: return home after rehab OT Goal Formulation: With patient Time For Goal Achievement: 04/04/19 Potential to Achieve Goals: Good ADL Goals Pt Will Perform Grooming: with supervision;standing Pt Will Perform Upper Body Bathing: with set-up;sitting Pt Will Perform Upper Body Dressing: with set-up;sitting Pt Will Transfer to Toilet: with min assist;ambulating;regular height toilet;bedside commode Pt Will Perform Toileting - Clothing Manipulation and hygiene: with min guard assist;sitting/lateral leans;sit to/from stand Pt/caregiver  will Perform Home Exercise Program: Increased strength;Both right and left upper extremity;With Supervision;With written HEP provided  Plan Discharge plan remains appropriate    Co-evaluation    PT/OT/SLP Co-Evaluation/Treatment: Yes Reason for Co-Treatment: Complexity of the patient's impairments (multi-system involvement);For patient/therapist safety;To address functional/ADL transfers PT goals addressed during session: Mobility/safety with mobility;Strengthening/ROM OT goals addressed during session: ADL's and self-care;Proper use of Adaptive equipment and DME         End of Session Equipment Utilized During Treatment: Gait belt;Rolling walker  OT Visit Diagnosis: Muscle weakness (generalized) (M62.81)   Activity Tolerance Patient limited by pain;Patient tolerated treatment well   Patient Left in chair;with call bell/phone within reach;with chair alarm set   Nurse Communication Patient requests pain meds        Time: 0810-0830 OT Time Calculation (min): 20 min  Charges: OT Treatments $Self Care/Home Management : 8-22 mins   Guadelupe Sabin, OTR/L  [phone] 03/22/2019, 12:01 PM

## 2019-03-22 NOTE — Discharge Summary (Signed)
Physician Discharge Summary  ALOHA BARTOK Betty Carpenter:[id number] DOB: 1925/04/07 DOA: 03/17/2019  PCP: Betty Housekeeper, MD  Admit date: 03/17/2019 Discharge date: 03/22/2019  Time spent: 35 minutes  Recommendations for Outpatient Follow-up:  1. Repeat CBC in 1 week to follow hemoglobin trend 2. Repeat basic metabolic panel in 1 week to follow electrolytes and renal function   Discharge Diagnoses:  Active Problems:   Breast cancer, stage 1 (HCC)   History of DVT (deep vein thrombosis)   Chronic back pain   Benign essential HTN   Chronic, continuous use of opioids   Closed right hip fracture (HCC)   Sinus tachycardia   Pressure injury of skin   S/P right hip ORIF 03/18/19   Anxiety   Discharge Condition: Stable and improved.  Patient discharged to skilled nursing facility for further care and rehabilitation.  Diet recommendation: Heart healthy diet.  Filed Weights   03/17/19 1658 03/18/19 1054  Weight: 59 kg 58.9 kg    History of present illness:  As per H&P written by Dr. Darrick Carpenter on 03/17/2019 85 y.o.female,with medical history significant ofHTN, chronic back pain, chronic opioid use,history ofDVTused to be onXarelto (stopped 1.5 years ago), and breast cancers/pmastectomy,patient was sent from her facility secondary to mechanical fall, and right hip pain, patient cannot recall exact events, but usually she ambulates with a cane, remember she was walking without her cane, and she does report mechanical fall while walking outside, she denies any dizziness, lightheadedness, syncope, loss of consciousness or any focal deficits, denies tingling and numbness; she reports immediate right hip pain after her fall and was transfer to ED.Denies any fever, chest pain, shortness of breath, dysuria, polyuria. - in [place] x-ray significant for right hip fracture, no significant lab abnormality, she was noted to have sinus tachycardia secondary to significant pain, orthopedic was consulted by ED, [facility]  was called to admit  Hospital Course:  1- right hip pain: due to hip fracture -s/p ORIF -tolerated poccedure well -will follow with orthopedic service in 2 weeks after discharge. -continue PRN pain meds -PT evaluation completed and recommending SNF for rehabilitation. -Patient will be started on Xarelto 10 mg daily for DVT prophylaxis. -Continue weightbearing as tolerated -Staples out postoperative day 14 (on 04/01/19) -Repeat x-ray in 2 weeks, 6 weeks and 12 weeks. -Outpatient follow-up with Dr. Aline Carpenter -COVID-19 test negative; patient will be discharge to SNF for further care and rehabilitation.  2-Sinus tachycardia -EKG demonstrating sinus rhythm  -continue pain management and supportive care -Heart rate much better controlled now; patient has been started on Lopressor. -Denies chest pain or palpitations.  3-HTN -stable -will continue amlodipine and Lopressor. -Heart healthy diet has been encouraged.  4-chronic back pain -stable overall -continue home PRN pain meds  5-Hx of DVT -completed treatment with xarelto approx 1.5 year ago -After discussing with orthopedic surgery patient will be started on Xarelto 10 mg daily for DVT prophylaxis. -No swelling or calf tenderness appreciated on exam.  6-moderate protein calorie malnutrition -Discharged on daily multivitamins and Ensure twice a day -Body mass index is 18.63 kg/m.  7-hx of breast cancer -s/p mastectomy -appears to be in total remission -Continue outpatient follow-up/appropriate subsequent screening.  8-urinary retention -Patient experience overnight urinary retention episode -Appears to be resolved after in and out cath x1 -So far voiding without problems.  9-anxiety -Continue as needed lorazepam. -Mood overall stable.  Procedures:  See below for x-ray reports  ORIF Right Hip 03/18/2019  Consultations:  Orthopedic surgery  Discharge Exam: Vitals:  03/21/19 2115 03/22/19 0544  BP:  138/82 (!) 176/100  Pulse: 74 (!) 44  Resp: 16 16  Temp: 98.1 F (36.7 C) 98.1 F (36.7 C)  SpO2: 97% 99%   General exam: Alert, awake, oriented; afebrile, no chest pain, no nausea, no vomiting, no shortness of breath.  Good oxygen saturation on room air.  No overnight events.  Stable and ready to be discharged to skilled nursing facility for further care and rehabilitation.  Respiratory system: Clear to auscultation. Respiratory effort normal. Cardiovascular system:RRR. No murmurs, rubs, gallops. Gastrointestinal system: Abdomen is nondistended, soft and nontender. No organomegaly or masses felt. Normal bowel sounds heard. Central nervous system: Alert and oriented. No focal neurological deficits. Extremities: No cyanosis or clubbing; still reporting right hip pain; clean dressings in place. Psychiatry: Mood & affect appropriate.    Discharge Instructions   Discharge Instructions    Diet - low sodium heart healthy   Complete by:  As directed    Discharge instructions   Complete by:  As directed    Guarantee appropriate hydration and heart healthy diet Outpatient follow-up with orthopedic service as instructed Weightbearing as tolerated Physical therapy and rehabilitation as per the skilled nursing facility protocol     Allergies as of 03/22/2019      Reactions   Cymbalta [duloxetine Hcl]    " couldn't make water"- unable to urinate      Medication List    STOP taking these medications   ciprofloxacin 500 MG tablet Commonly known as:  Cipro     TAKE these medications   acetaminophen 500 MG tablet Commonly known as:  TYLENOL Take 1,000 mg by mouth every 6 (six) hours as needed for mild pain or headache.   amLODipine 5 MG tablet Commonly known as:  NORVASC Take 1 tablet (5 mg total) by mouth daily. Start taking on:  Mar 23, 2019 What changed:    when to take this  additional instructions   celecoxib 200 MG capsule Commonly known as:  CELEBREX Take 1 capsule  (200 mg total) by mouth 2 (two) times daily.   Docusate Sodium 100 MG capsule Take 100 mg by mouth 2 (two) times daily.   feeding supplement (ENSURE ENLIVE) Liqd Take 237 mLs by mouth 2 (two) times daily between meals.   LORazepam 0.5 MG tablet Commonly known as:  ATIVAN Take 1 tablet (0.5 mg total) by mouth 2 (two) times daily as needed for anxiety.   lubiprostone 24 MCG capsule Commonly known as:  AMITIZA Take 24 mcg by mouth 2 (two) times daily.   metoprolol tartrate 25 MG tablet Commonly known as:  LOPRESSOR Take 1 tablet (25 mg total) by mouth 2 (two) times daily.   morphine 30 MG 12 hr tablet Commonly known as:  MS CONTIN Take 1 tablet (30 mg total) by mouth 2 (two) times daily for 7 days. Hold if BP <110/60   ondansetron 4 MG disintegrating tablet Commonly known as:  ZOFRAN-ODT Take 4 mg by mouth every 6 (six) hours as needed for nausea or vomiting.   Oxycodone HCl 20 MG Tabs Take 1 tablet (20 mg total) by mouth every 6 (six) hours as needed for up to 7 days (break through pain). What changed:    when to take this  reasons to take this   pantoprazole 20 MG tablet Commonly known as:  Protonix Take 1 tablet (20 mg total) by mouth daily.   rivaroxaban 10 MG Tabs tablet Commonly known as:  XARELTO Take  1 tablet (10 mg total) by mouth daily. What changed:    medication strength  how much to take   tiZANidine 4 MG tablet Commonly known as:  ZANAFLEX Take 1 tablet (4 mg total) by mouth every 8 (eight) hours as needed for muscle spasms. 4mg  three times a day. Also 4mg  every 8 hours as needed for pain.   Vitamin D (Ergocalciferol) 1.25 MG (50000 UT) Caps capsule Commonly known as:  DRISDOL Take 50,000 Units by mouth See admin instructions. Twice a week. Days not specified on NH MAR.      Allergies  Allergen Reactions  . Cymbalta [Duloxetine Hcl]     " couldn't make water"- unable to urinate   Follow-up Information    Carole Civil, MD. Schedule an  appointment as soon as possible for a visit in 2 week(s).   Specialties:  Orthopedic Surgery, Radiology Contact information: [address] [place] [address] [phone]           The results of significant diagnostics from this hospitalization (including imaging, microbiology, ancillary and laboratory) are listed below for reference.    Significant Diagnostic Studies: Dg Chest Port 1 View  Result Date: 03/17/2019 CLINICAL DATA:  RIGHT hip fracture post fall, preoperative evaluation EXAM: PORTABLE CHEST 1 VIEW COMPARISON:  Portable exam 2125 hours compared to 10/21/2017 FINDINGS: Normal heart size, mediastinal contours, and pulmonary vascularity. Atherosclerotic calcification aorta. Lungs appear emphysematous but clear. No infiltrate, pleural effusion, or pneumothorax. Diffuse osseous demineralization. Surgical clips RIGHT axilla. IMPRESSION: Emphysematous changes without infiltrate. Electronically Signed   By: Lavonia Dana M.D.   On: 03/17/2019 21:34   Dg Hip Operative Unilat W Or W/o Pelvis Right  Result Date: 03/18/2019 CLINICAL DATA:  Right hip fracture EXAM: OPERATIVE right HIP (WITH PELVIS IF PERFORMED) 6 VIEWS TECHNIQUE: Fluoroscopic spot image(s) were submitted for interpretation post-operatively. COMPARISON:  03/17/2019 FINDINGS: Multiple C-arm images show placement of 3 Knowles type pins for treatment of a femoral neck fracture. Components appear well positioned without radiographically detectable complication. IMPRESSION: Three Knowles type pins placed for treatment of a femoral neck fracture on the right. Electronically Signed   By: Nelson Chimes M.D.   On: 03/18/2019 13:38   Dg Hip Unilat With Pelvis 2-3 Views Right  Result Date: 03/17/2019 CLINICAL DATA:  [place] today with right hip pain and external rotation. EXAM: DG HIP (WITH OR WITHOUT PELVIS) 2-3V RIGHT COMPARISON:  None. FINDINGS: Impacted femoral neck fracture. No intertrochanteric component seen. Other bones of  the pelvis appear negative. IMPRESSION: Impacted femoral neck fracture on the right. Electronically Signed   By: Nelson Chimes M.D.   On: 03/17/2019 18:39    Microbiology: Recent Results (from the past 240 hour(s))  Surgical PCR screen     Status: Abnormal   Collection Time: 03/17/19 11:10 PM  Result Value Ref Range Status   MRSA, PCR NEGATIVE NEGATIVE Final   Staphylococcus aureus POSITIVE (A) NEGATIVE Final    Comment: (NOTE) The Xpert SA Assay (FDA approved for NASAL specimens in patients 31 years of age and older), is one component of a comprehensive surveillance program. It is not intended to diagnose infection nor to guide or monitor treatment. Performed at [facility], [address] [place], [place] [address]   Novel Coronavirus, NAA (hospital order; send-out to ref lab)     Status: None   Collection Time: 03/20/19  7:28 AM  Result Value Ref Range Status   SARS-CoV-2, NAA NOT DETECTED NOT DETECTED Final  Comment: (NOTE) This test was developed and its performance characteristics determined by [employer]. This test has not been FDA cleared or approved. This test has been authorized by FDA under an Emergency Use Authorization (EUA). This test is only authorized for the duration of time the declaration that circumstances exist justifying the authorization of the emergency use of in vitro diagnostic tests for detection of SARS-CoV-2 virus and/or diagnosis of COVID-19 infection under section 564(b)(1) of the Act, 21 U.S.C. 741SEL-9(R)(3), unless the authorization is terminated or revoked sooner. When diagnostic testing is negative, the possibility of a false negative result should be considered in the context of a patient's recent exposures and the presence of clinical signs and symptoms consistent with COVID-19. An individual without symptoms of COVID-19 and who is not shedding SARS-CoV-2 virus would expect to have a negative (not detected) result in this  assay. Performed  At: [facility] [place], [place] [id number] Rush Farmer MD YS:[id number]    [place]  Final    Comment: Performed at [facility], [address] [place], [place] [address]     Labs: Basic Metabolic Panel: Recent Labs  Lab 03/17/19 1724 03/18/19 0618 03/19/19 0527 03/20/19 0623  NA 139 140 139 139  K 3.8 3.5 3.5 3.7  CL 102 107 104 103  CO2 26 27 27 27   GLUCOSE 116* 106* 102* 120*  BUN 21 17 18 13   CREATININE 0.83 0.65 0.66 0.61  CALCIUM 9.0 8.6* 8.3* 8.6*   CBC: Recent Labs  Lab 03/17/19 1724 03/18/19 0618 03/19/19 0527 03/20/19 0623 03/21/19 0521  WBC 5.1 4.8 5.0 6.0 4.8  NEUTROABS 4.2  --   --   --   --   HGB 12.0 11.1* 10.6* 11.7* 11.0*  HCT 38.5 34.7* 33.8* 36.6 34.4*  MCV 93.2 90.8 91.8 90.6 91.5  PLT 189 167 154 177 172    Signed:  Barton Dubois MD.  [facility] 03/22/2019, 9:21 AM

## 2019-03-22 NOTE — TOC Transition Note (Signed)
Transition of Care [facility]) - CM/SW Discharge Note   Patient Details  Name: Betty Carpenter MRN: [id number] Date of Birth: Apr 25, 1925  Transition of Care [facility]) CM/SW Contact:  Sherald Barge, RN Phone Number: 03/22/2019, 9:50 AM   Clinical Narrative:  DC to SNF today.      Final next level of care: [place] Barriers to Discharge: No Barriers Identified   Patient Goals and CMS Choice Patient states their goals for this hospitalization and ongoing recovery are:: Pt is unsure of her discharge goals but family is receptive to SNF recommendations       Discharge Placement PASRR number recieved: 03/21/19            Patient chooses bed at: [facility] Patient to be transferred to facility by: [facility] Staff Name of family member notified: [name] (Daughter) Patient and family notified of of transfer: 03/22/19  Discharge Plan and Services In-house Referral: Clinical Social Work              Readmission Risk Interventions Readmission Risk Prevention Plan 03/22/2019  Transportation Screening Complete  PCP or Specialist Appt within 5-7 Days Complete  Home Care Screening Complete  Medication Review (RN CM) Complete  Some recent data might be hidden

## 2019-03-22 NOTE — Progress Notes (Signed)
Physical Therapy Treatment Patient Details Name: Betty Carpenter MRN: [id number] DOB: 28-Nov-1924 Today's Date: 03/22/2019    History of Present Illness Betty Carpenter  is a 85 y.o. female s/p CANNULATED HIP PINNING RIGHT HIP 03/18/19 secondary to fall with medical history significant of HTN, chronic back pain, chronic opioid use, history of DVT used to be on Xarelto (stopped 1.5 years ago), and breast cancer s/p mastectomy, patient was sent from her facility secondary to mechanical fall, and right hip pain, patient cannot recall exact events, but usually she ambulates with a cane, remember she was walking without her cane, and she does report mechanical fall while walking outside, she denies any dizziness, lightheadedness, syncope, loss of consciousness or any focal deficits, tingling numbness, she reports immediate right hip pain after her fall and transfer to ED. denies any fever, chest pain, shortness of breath, dysuria, polyuria.    PT Comments    Pt supine and agreeable to participate with therapy today.  Pt was limited by LBP, Rt hip pain with weight bearing and overall weakness.  Pt improving independence with bed mobility, able to complete without assistance just increased time to complete and cueing for mechanics to assist with scooting.  Mod A with sit to stand, cueing for handplacement to assist with pushing from bed and encouraged to lean forward to assist with weight bearing during transition to standing.  Upon standing pt reports she feels weak and pain with weight bearing, pt instructed use of RW to assist with weight bearing and able to complete sidesteps infront of bed to chair.  Seated strenghening exercises complete for LE.  EOS pt left in chair with call bell within reach and chair alarm set.  RN informed to assist with pain medication if able and replace pure whick.     Follow Up Recommendations  SNF     Equipment Recommendations  None recommended by PT    Recommendations for Other  Services       Precautions / Restrictions Precautions Precautions: Fall Restrictions Weight Bearing Restrictions: Yes RLE Weight Bearing: Weight bearing as tolerated    Mobility  Bed Mobility Overal bed mobility: Needs Assistance Bed Mobility: Supine to Sit     Supine to sit: Min assist     General bed mobility comments: slow labored movement   Transfers Overall transfer level: Needs assistance Equipment used: Rolling walker (2 wheeled) Transfers: Sit to/from Stand Sit to Stand: Mod assist         General transfer comment: increased time, slow labored movement; cueing for mechanics  Ambulation/Gait Ambulation/Gait assistance: Mod assist Gait Distance (Feet): 8 Feet Assistive device: Rolling walker (2 wheeled) Gait Pattern/deviations: Decreased step length - right;Decreased stance time - right;Decreased stride length;Antalgic Gait velocity: slow   General Gait Details: difficulty weight bearing on RLE due to pain and generalized weakness, slow labored cadence   Stairs             Wheelchair Mobility    Modified Rankin (Stroke Patients Only)       Balance                                            Cognition Arousal/Alertness: Awake/alert Behavior During Therapy: WFL for tasks assessed/performed Overall Cognitive Status: Within Functional Limits for tasks assessed  Exercises Total Joint Exercises Ankle Circles/Pumps: AROM;Both;Strengthening;10 reps;Seated Long Arc Quad: Both;10 reps;Seated;Strengthening    General Comments        Pertinent Vitals/Pain Pain Assessment: 0-10 Pain Score: 6  Pain Location: low back and right hip with movement, weightbearing Pain Descriptors / Indicators: Aching;Sore;Grimacing Pain Intervention(s): Monitored during session;Limited activity within patient's tolerance(RN aware of pain and request for meds)    Home Living                       Prior Function            PT Goals (current goals can now be found in the care plan section)      Frequency    Min 5X/week      PT Plan Current plan remains appropriate    Co-evaluation              AM-PAC PT "6 Clicks" Mobility   Outcome Measure  Help needed turning from your back to your side while in a flat bed without using bedrails?: A Little Help needed moving from lying on your back to sitting on the side of a flat bed without using bedrails?: A Lot Help needed moving to and from a bed to a chair (including a wheelchair)?: A Lot Help needed standing up from a chair using your arms (e.g., wheelchair or bedside chair)?: A Lot Help needed to walk in hospital room?: A Lot Help needed climbing 3-5 steps with a railing? : Total 6 Click Score: 12    End of Session Equipment Utilized During Treatment: Gait belt Activity Tolerance: Patient tolerated treatment well;Patient limited by fatigue Patient left: in chair;with call bell/phone within reach;with chair alarm set Nurse Communication: Mobility status PT Visit Diagnosis: Unsteadiness on feet (R26.81);Other abnormalities of gait and mobility (R26.89);Muscle weakness (generalized) (M62.81)     Time: 8921-1941 PT Time Calculation (min) (ACUTE ONLY): 23 min  Charges:  $Therapeutic Activity: 23-37 mins                     [address], LPTA; CBIS [phone]   Aldona Lento 03/22/2019, 9:44 AM

## 2019-03-22 NOTE — Progress Notes (Signed)
IV removed, 2x2 gauze and paper tape applied to site, patient tolerated well.  Report called to [place] at [facility] and all questions answered.  Patient awaiting [facility] staff arrival to transport to the [facility].

## 2019-03-23 ENCOUNTER — Telehealth: Payer: Self-pay | Admitting: Orthopedic Surgery

## 2019-03-23 ENCOUNTER — Other Ambulatory Visit: Payer: Self-pay | Admitting: Adult Health

## 2019-03-23 ENCOUNTER — Non-Acute Institutional Stay (SKILLED_NURSING_FACILITY): Payer: Medicare Other | Admitting: Adult Health

## 2019-03-23 ENCOUNTER — Encounter: Payer: Self-pay | Admitting: Adult Health

## 2019-03-23 DIAGNOSIS — K219 Gastro-esophageal reflux disease without esophagitis: Secondary | ICD-10-CM | POA: Insufficient documentation

## 2019-03-23 DIAGNOSIS — S72001D Fracture of unspecified part of neck of right femur, subsequent encounter for closed fracture with routine healing: Secondary | ICD-10-CM

## 2019-03-23 DIAGNOSIS — I10 Essential (primary) hypertension: Secondary | ICD-10-CM

## 2019-03-23 DIAGNOSIS — R000 Tachycardia, unspecified: Secondary | ICD-10-CM

## 2019-03-23 DIAGNOSIS — T402X5A Adverse effect of other opioids, initial encounter: Secondary | ICD-10-CM

## 2019-03-23 DIAGNOSIS — C50919 Malignant neoplasm of unspecified site of unspecified female breast: Secondary | ICD-10-CM

## 2019-03-23 DIAGNOSIS — G8929 Other chronic pain: Secondary | ICD-10-CM

## 2019-03-23 DIAGNOSIS — M545 Low back pain: Secondary | ICD-10-CM

## 2019-03-23 DIAGNOSIS — E559 Vitamin D deficiency, unspecified: Secondary | ICD-10-CM

## 2019-03-23 DIAGNOSIS — K5903 Drug induced constipation: Secondary | ICD-10-CM | POA: Insufficient documentation

## 2019-03-23 DIAGNOSIS — F419 Anxiety disorder, unspecified: Secondary | ICD-10-CM

## 2019-03-23 MED ORDER — OXYCODONE HCL 10 MG PO TABS: 10.0000 mg | ORAL_TABLET | Freq: Three times a day (TID) | ORAL | 0 refills | Status: DC | PRN

## 2019-03-23 MED ORDER — LORAZEPAM 0.5 MG PO TABS: 0.5000 mg | ORAL_TABLET | Freq: Two times a day (BID) | ORAL | 0 refills | Status: DC | PRN

## 2019-03-23 NOTE — Progress Notes (Signed)
Location:    [place] Room Number: 156/W Place of Service:  SNF (31)   CODE STATUS: DNR  Allergies  Allergen Reactions  . Cymbalta [Duloxetine Hcl]     " couldn't make water"- unable to urinate    Chief Complaint  Patient presents with  . Hospitalization Follow-up    Hospitalization F/U Visit    HPI:    Past Medical History:  Diagnosis Date  . Arthritis   . Breast cancer ([place])   . Chronic back pain   . Closed wedge compression fracture of L1 vertebra ([place]) 2004  . Hypertension   . Sciatica     Past Surgical History:  Procedure Laterality Date  . APPENDECTOMY    . HIP PINNING,CANNULATED Right 03/18/2019   Procedure: CANNULATED HIP PINNING;  Surgeon: Carole Civil, MD;  Location: [facility];  Service: Orthopedics;  Laterality: Right;  11 am     Social History   Socioeconomic History  . Marital status: Married    Spouse name: Not on file  . Number of children: Not on file  . Years of education: Not on file  . Highest education level: Not on file  Occupational History  . Not on file  Social Needs  . Financial resource strain: Not on file  . Food insecurity:    Worry: Not on file    Inability: Not on file  . Transportation needs:    Medical: Not on file    Non-medical: Not on file  Tobacco Use  . Smoking status: Never Smoker  . Smokeless tobacco: Never Used  Substance and Sexual Activity  . Alcohol use: Not on file  . Drug use: Not on file  . Sexual activity: Never  Lifestyle  . Physical activity:    Days per week: Not on file    Minutes per session: Not on file  . Stress: Not on file  Relationships  . Social connections:    Talks on phone: Not on file    Gets together: Not on file    Attends religious service: Not on file    Active member of club or organization: Not on file    Attends meetings of clubs or organizations: Not on file    Relationship status: Not on file  . Intimate partner violence:    Fear of current  or ex partner: Not on file    Emotionally abused: Not on file    Physically abused: Not on file    Forced sexual activity: Not on file  Other Topics Concern  . Not on file  Social History Narrative  . Not on file   Family History  Problem Relation Age of Onset  . Cancer Maternal Grandfather       VITAL SIGNS BP 102/62   Pulse 62   Temp 98.5 F (36.9 C) (Oral)   Resp 17   Ht 5\' 11"  (1.803 m)   Wt 127 lb 4.8 oz (57.7 kg)   BMI 17.75 kg/m   Outpatient Encounter Medications as of 03/23/2019  Medication Sig  . acetaminophen (TYLENOL) 500 MG tablet Take 1,000 mg by mouth every 8 (eight) hours.   Marland Kitchen amLODipine (NORVASC) 5 MG tablet Take 1 tablet (5 mg total) by mouth daily.  . celecoxib (CELEBREX) 200 MG capsule Take 1 capsule (200 mg total) by mouth 2 (two) times daily.  . collagenase (SANTYL) ointment Apply 1 application topically daily.  Mariane Baumgarten Sodium 100 MG capsule Take 100 mg by mouth  2 (two) times daily.  . feeding supplement, ENSURE ENLIVE, (ENSURE ENLIVE) LIQD Take 237 mLs by mouth 2 (two) times daily between meals.  Marland Kitchen LORazepam (ATIVAN) 0.5 MG tablet Take 1 tablet (0.5 mg total) by mouth 2 (two) times daily as needed for up to 13 days for anxiety.  Marland Kitchen lubiprostone (AMITIZA) 24 MCG capsule Take 24 mcg by mouth 2 (two) times daily.  . metoprolol tartrate (LOPRESSOR) 25 MG tablet Take 1 tablet (25 mg total) by mouth 2 (two) times daily.  . Multiple Vitamins-Minerals (MULTIVITAMIN WITH MINERALS) tablet Take 1 tablet by mouth daily.  . ondansetron (ZOFRAN-ODT) 4 MG disintegrating tablet Take 4 mg by mouth every 6 (six) hours as needed for nausea or vomiting.   . Oxycodone HCl 20 MG TABS Take 20 mg by mouth every 6 (six) hours as needed.  . pantoprazole (PROTONIX) 20 MG tablet Take 1 tablet (20 mg total) by mouth daily.  . rivaroxaban (XARELTO) 10 MG TABS tablet Take 1 tablet (10 mg total) by mouth daily.  Marland Kitchen tiZANidine (ZANAFLEX) 4 MG tablet Take 1 tablet (4 mg total) by mouth  every 8 (eight) hours as needed for muscle spasms. 4mg  three times a day. Also 4mg  every 8 hours as needed for pain.  . Vitamin D, Ergocalciferol, (DRISDOL) 50000 units CAPS capsule Take 50,000 Units by mouth See admin instructions. Twice a week. Days not specified on NH MAR.  . [DISCONTINUED] Oxycodone HCl 10 MG TABS Take 1 tablet (10 mg total) by mouth every 8 (eight) hours as needed for up to 7 days.   No facility-administered encounter medications on file as of 03/23/2019.      SIGNIFICANT DIAGNOSTIC EXAMS       ASSESSMENT/ PLAN:    MD is aware of resident's narcotic use and is in agreement with current plan of care. We will attempt to wean resident as apropriate   Ok Edwards NP [facility]  Contact [phone] Monday through Friday 8am- 5pm  After hours call [phone]

## 2019-03-23 NOTE — Progress Notes (Signed)
Location:   [facility] Room Number: 156/W Place of Service:  SNF (31)   CODE STATUS: full code   Allergies  Allergen Reactions  . Cymbalta [Duloxetine Hcl]     " couldn't make water"- unable to urinate    Chief Complaint  Patient presents with  . Hospitalization Follow-up        HPI:  She is a 85 year old woman who has been hospitalized from 03-17-19 through 03-22-19. She had a fall at her assisted living facility and suffered a right femoral neck fracture. She is here for short term rehab. She was wheelchair dependent prior to her fall. She does have chronic back pain and has a history of narcotic dependence. She denies any changes in appetite; her pain is managed her back pain is worse. She denies any insomnia. Her goal is to return back to assisted living. She will continue to be followed for her chronic illnesses including: sinus tachycardia; hypertension; back pain.   Past Medical History:  Diagnosis Date  . Arthritis   . Breast cancer ([place])   . Chronic back pain   . Closed wedge compression fracture of L1 vertebra ([place]) 2004  . Hypertension   . Sciatica     Past Surgical History:  Procedure Laterality Date  . APPENDECTOMY    . HIP PINNING,CANNULATED Right 03/18/2019   Procedure: CANNULATED HIP PINNING;  Surgeon: Carole Civil, MD;  Location: [facility];  Service: Orthopedics;  Laterality: Right;  11 am     Social History   Socioeconomic History  . Marital status: Married    Spouse name: Not on file  . Number of children: Not on file  . Years of education: Not on file  . Highest education level: Not on file  Occupational History  . Not on file  Social Needs  . Financial resource strain: Not on file  . Food insecurity:    Worry: Not on file    Inability: Not on file  . Transportation needs:    Medical: Not on file    Non-medical: Not on file  Tobacco Use  . Smoking status: Never Smoker  . Smokeless tobacco: Never Used  Substance and Sexual  Activity  . Alcohol use: Not on file  . Drug use: Not on file  . Sexual activity: Never  Lifestyle  . Physical activity:    Days per week: Not on file    Minutes per session: Not on file  . Stress: Not on file  Relationships  . Social connections:    Talks on phone: Not on file    Gets together: Not on file    Attends religious service: Not on file    Active member of club or organization: Not on file    Attends meetings of clubs or organizations: Not on file    Relationship status: Not on file  . Intimate partner violence:    Fear of current or ex partner: Not on file    Emotionally abused: Not on file    Physically abused: Not on file    Forced sexual activity: Not on file  Other Topics Concern  . Not on file  Social History Narrative  . Not on file   Family History  Problem Relation Age of Onset  . Cancer Maternal Grandfather       VITAL SIGNS BP 102/62   Pulse 62   Temp 98.5 F (36.9 C) (Oral)   Resp 17   Ht 5\' 11"  (  1.803 m)   Wt 127 lb 4.8 oz (57.7 kg)   BMI 17.75 kg/m   Outpatient Encounter Medications as of 03/23/2019  Medication Sig  . acetaminophen (TYLENOL) 500 MG tablet Take 1,000 mg by mouth every 8 (eight) hours.   Marland Kitchen amLODipine (NORVASC) 5 MG tablet Take 1 tablet (5 mg total) by mouth daily.  . celecoxib (CELEBREX) 200 MG capsule Take 1 capsule (200 mg total) by mouth 2 (two) times daily.  . collagenase (SANTYL) ointment Apply 1 application topically daily.  Mariane Baumgarten Sodium 100 MG capsule Take 100 mg by mouth 2 (two) times daily.  . feeding supplement, ENSURE ENLIVE, (ENSURE ENLIVE) LIQD Take 237 mLs by mouth 2 (two) times daily between meals.  Marland Kitchen LORazepam (ATIVAN) 0.5 MG tablet Take 1 tablet (0.5 mg total) by mouth 2 (two) times daily as needed for up to 13 days for anxiety.  Marland Kitchen lubiprostone (AMITIZA) 24 MCG capsule Take 24 mcg by mouth 2 (two) times daily.  . metoprolol tartrate (LOPRESSOR) 25 MG tablet Take 1 tablet (25 mg total) by mouth 2 (two)  times daily.  . Multiple Vitamins-Minerals (MULTIVITAMIN WITH MINERALS) tablet Take 1 tablet by mouth daily.  . ondansetron (ZOFRAN-ODT) 4 MG disintegrating tablet Take 4 mg by mouth every 6 (six) hours as needed for nausea or vomiting.   . Oxycodone HCl 20 MG TABS Take 20 mg by mouth every 6 (six) hours as needed.  . pantoprazole (PROTONIX) 20 MG tablet Take 1 tablet (20 mg total) by mouth daily.  . rivaroxaban (XARELTO) 10 MG TABS tablet Take 1 tablet (10 mg total) by mouth daily.  Marland Kitchen tiZANidine (ZANAFLEX) 4 MG tablet Take 4 mg by mouth 3 (three) times daily.  . Vitamin D, Ergocalciferol, (DRISDOL) 50000 units CAPS capsule Take 50,000 Units by mouth See admin instructions. Twice a week. Days not specified on NH MAR.  . [DISCONTINUED] tiZANidine (ZANAFLEX) 4 MG tablet Take 1 tablet (4 mg total) by mouth every 8 (eight) hours as needed for muscle spasms. 4mg  three times a day. Also 4mg  every 8 hours as needed for pain. (Patient taking differently: Take 4 mg by mouth 3 (three) times daily. )  . [DISCONTINUED] Oxycodone HCl 10 MG TABS Take 1 tablet (10 mg total) by mouth every 8 (eight) hours as needed for up to 7 days.   No facility-administered encounter medications on file as of 03/23/2019.      SIGNIFICANT DIAGNOSTIC EXAMS  TODAY:   03-17-19: right hip x-ray; impacted femoral neck fracture on the right   03-17-19: chest x-ray; Emphysematous changes without infiltrate.  LABS REVIEWED TODAY;   03-17-19: wbc 5.1; hgb 12.0; hct 38.5; mcv 93.2 plt 189; glucose 116; bun 21; creat 0.83; k+3.8; na++ 139; ca 9.0  03-18-19: wbc 4.8; hgb 11.1; hct 34.7; mcv 90.8 ;plt 167; glucose 106; bun 17; creat 0.65; k+ 3.5; na++ 140; ca 8.6 03-20-19: wbc 6.0; hgb 11.7; hct 36.6; mcv 90.6; plt 177; glucose 120; bun 13; creat 0.61; k+ 3.7; na ++ 139; ca 8.6   Review of Systems  Constitutional: Negative for malaise/fatigue.  Respiratory: Negative for cough and shortness of breath.   Cardiovascular: Negative for chest  pain, palpitations and leg swelling.  Gastrointestinal: Negative for abdominal pain, constipation and heartburn.  Musculoskeletal: Positive for back pain and joint pain. Negative for myalgias.       Chronic back pain Right hip pain   Skin:       Sore right ankle   Neurological: Negative  for dizziness.  Psychiatric/Behavioral: The patient is not nervous/anxious.     Physical Exam Constitutional:      General: She is not in acute distress.    Appearance: She is well-developed. She is not diaphoretic.     Comments: thin  Neck:     Musculoskeletal: Neck supple.     Thyroid: No thyromegaly.  Cardiovascular:     Rate and Rhythm: Normal rate and regular rhythm.     Pulses: Normal pulses.     Heart sounds: Normal heart sounds.  Pulmonary:     Effort: Pulmonary effort is normal. No respiratory distress.     Breath sounds: Normal breath sounds.  Abdominal:     General: Bowel sounds are normal. There is no distension.     Palpations: Abdomen is soft.     Tenderness: There is no abdominal tenderness.  Musculoskeletal:     Right lower leg: No edema.     Left lower leg: No edema.     Comments: Is able to move all extremities Is status post right femoral neck fracture   Lymphadenopathy:     Cervical: No cervical adenopathy.  Skin:    General: Skin is warm and dry.     Comments: Right medical ankle stasis ulcer: yellow slough present; no signs of infection present.  Left heel callus Right hip incision line without signs of infection present   Neurological:     Mental Status: She is alert. Mental status is at baseline.  Psychiatric:        Mood and Affect: Mood normal.       ASSESSMENT/ PLAN:  TODAY:   1. Benign essential HTN; is stable b/p 106/62 will continue norvasc 5 mg daily and lopressor 25 mg twice daily   2. Sinus tachycardia: heart rate is stable will continue lopressor 25 mg twice daily   3. Breat cancer stage 1: is stable is status post right mastectomy will  monitor   4. Closed fracture of right hip with routine healing subsequent encounter: is stable will continue therapy as directed and will follow up with orthopedics; will continue oxycodone 10 mg every 8 hours as needed through 03-30-19. Will continue tylenol 1 gm three times daily and zanaflex 4 mg every 8 hours is taking xarelto 10 mg daily for 30 days   5. Chronic bilateral low back pain without sciatica: is without change: will continue celebrex 200 mg twice daily will begin lidoderm 4% patch to her back has oxycodone 10 mg every 8 hours as needed for pain through 03-30-19. Will continue tylenol 1 gm three times daily and zanaflex 4 mg every 8 hours   6. GERD without esophagitis: is stable will continue protonix 20 mg daily   7. Constipation due to opioid therapy. Is stable will continue colace twice daily and amitiza 24 mcg twice daily   8. Vit D deficiency: is stable will continue vit D 50,000 units twice weekly   9. Anxiety is stable will continue ativan 0.5 mg twice daily as needed for 14 days.    Will check cbc; bmp on 03-28-19       MD is aware of resident's narcotic use and is in agreement with current plan of care. We will attempt to wean resident as apropriate   Ok Edwards NP [facility]  Contact [phone] Monday through Friday 8am- 5pm  After hours call [phone]

## 2019-03-23 NOTE — Telephone Encounter (Signed)
Call received from Santiago Glad at [facility]; has question regarding aspirin (per Dr Ruthe Mannan orders) Vs Xarelto (per patient's hospital discharge orders). Santiago Glad requests call back to her Cell [phone] (alternate number for [place]

## 2019-03-28 ENCOUNTER — Encounter (HOSPITAL_COMMUNITY)
Admission: RE | Admit: 2019-03-28 | Discharge: 2019-03-28 | Disposition: A | Discharge status: Home / Self Care | Payer: Medicare Other | Source: Skilled Nursing Facility | Attending: *Deleted | Admitting: *Deleted

## 2019-03-28 ENCOUNTER — Non-Acute Institutional Stay (SKILLED_NURSING_FACILITY): Payer: Medicare Other | Admitting: Internal Medicine

## 2019-03-28 ENCOUNTER — Encounter: Payer: Self-pay | Admitting: Internal Medicine

## 2019-03-28 DIAGNOSIS — F419 Anxiety disorder, unspecified: Secondary | ICD-10-CM | POA: Diagnosis not present

## 2019-03-28 DIAGNOSIS — R000 Tachycardia, unspecified: Secondary | ICD-10-CM

## 2019-03-28 DIAGNOSIS — I10 Essential (primary) hypertension: Secondary | ICD-10-CM

## 2019-03-28 DIAGNOSIS — Z8781 Personal history of (healed) traumatic fracture: Secondary | ICD-10-CM

## 2019-03-28 DIAGNOSIS — Z86718 Personal history of other venous thrombosis and embolism: Secondary | ICD-10-CM | POA: Diagnosis not present

## 2019-03-28 LAB — CBC
HCT: 35.9 % — ABNORMAL LOW (ref 36.0–46.0)
Hemoglobin: 11.8 g/dL — ABNORMAL LOW (ref 12.0–15.0)
MCH: 29.6 pg (ref 26.0–34.0)
MCHC: 32.9 g/dL (ref 30.0–36.0)
MCV: 90 fL (ref 80.0–100.0)
Platelets: 341 10*3/uL (ref 150–400)
RBC: 3.99 MIL/uL (ref 3.87–5.11)
RDW: 13.4 % (ref 11.5–15.5)
WBC: 5.2 10*3/uL (ref 4.0–10.5)
nRBC: 0 % (ref 0.0–0.2)

## 2019-03-28 LAB — BASIC METABOLIC PANEL
Anion gap: 9 (ref 5–15)
BUN: 30 mg/dL — ABNORMAL HIGH (ref 8–23)
CO2: 26 mmol/L (ref 22–32)
Calcium: 9.3 mg/dL (ref 8.9–10.3)
Chloride: 101 mmol/L (ref 98–111)
Creatinine, Ser: 0.92 mg/dL (ref 0.44–1.00)
GFR calc Af Amer: >60 mL/min (ref >60)
GFR calc non Af Amer: 54 mL/min — ABNORMAL LOW (ref >60)
Glucose, Bld: 104 mg/dL — ABNORMAL HIGH (ref 70–99)
Potassium: 4.1 mmol/L (ref 3.5–5.1)
Sodium: 136 mmol/L (ref 135–145)

## 2019-03-28 LAB — SARS CORONAVIRUS 2 BY RT PCR (HOSPITAL ORDER, PERFORMED IN ~~LOC~~ HOSPITAL LAB): SARS Coronavirus 2: NEGATIVE

## 2019-03-28 NOTE — Progress Notes (Signed)
Provider:  Veleta Miners, MD Location:  [facility] Room Number: [place] of Service:  SNF (31)  PCP: Dione Housekeeper, MD Patient Care Team: Dione Housekeeper, MD as PCP - General [facility] Medicine)  Extended Emergency Contact Information Primary Emergency Contact: DIAZ,SHIRLEY Address: [address] [place]          [place], [place] [address] [place] Phone: [phone] Mobile Phone: [phone] Relation: Daughter  Code Status: Full Code Goals of Care: Advanced Directive information Advanced Directives 03/28/2019  Does Patient Have a Medical Advance Directive? No  Type of Advance Directive -  Does patient want to make changes to medical advance directive? No - Patient declined  Copy of [place] in Chart? -  Would patient like information on creating a medical advance directive? No - Patient declined  Pre-existing out of facility DNR order (yellow form or pink MOST form) -      Chief Complaint  Patient presents with  . New Admit To SNF    Admission    HPI: Patient is a 85 y.o. female seen today for admission to SNF for therapy She was in the hospital from 04/30-05/05 for Right Hip Fracture Patient has a history of hypertension, chronic back pain, history of DVT, history of breast cancer status post mastectomy Patient lives in [place] with her husband and had a fall.She was found to have right hipFracture She underwent ORIF on 05/01. Her postop course was complicated with tachycardia and she was started on Lopressor.  Otherwise patient did well she was also started on Xarelto for DVT prophylaxis as patient has a history of DVT in the past. Patient is now in SNF first therapy.  Had mild pain in that side.  Per nurses she is mostly staying in the bed.  Was very confused and was unable to tell me the details of her hospital stay.  She just was fine with that eventually she wants to go back with her husband Patient has had no fever,  chills Per Education officer, museum she lives in [place] facility and was walking with a walker  Past Medical History:  Diagnosis Date  . Arthritis   . Breast cancer ([place])   . Chronic back pain   . Closed wedge compression fracture of L1 vertebra ([place]) 2004  . Hypertension   . Sciatica    Past Surgical History:  Procedure Laterality Date  . APPENDECTOMY    . HIP PINNING,CANNULATED Right 03/18/2019   Procedure: CANNULATED HIP PINNING;  Surgeon: Carole Civil, MD;  Location: [facility];  Service: Orthopedics;  Laterality: Right;  11 am     reports that she has never smoked. She has never used smokeless tobacco. No history on file for alcohol and drug. Social History   Socioeconomic History  . Marital status: Married    Spouse name: Not on file  . Number of children: Not on file  . Years of education: Not on file  . Highest education level: Not on file  Occupational History  . Not on file  Social Needs  . Financial resource strain: Not on file  . Food insecurity:    Worry: Not on file    Inability: Not on file  . Transportation needs:    Medical: Not on file    Non-medical: Not on file  Tobacco Use  . Smoking status: Never Smoker  . Smokeless tobacco: Never Used  Substance and Sexual Activity  . Alcohol use: Not on  file  . Drug use: Not on file  . Sexual activity: Never  Lifestyle  . Physical activity:    Days per week: Not on file    Minutes per session: Not on file  . Stress: Not on file  Relationships  . Social connections:    Talks on phone: Not on file    Gets together: Not on file    Attends religious service: Not on file    Active member of club or organization: Not on file    Attends meetings of clubs or organizations: Not on file    Relationship status: Not on file  . Intimate partner violence:    Fear of current or ex partner: Not on file    Emotionally abused: Not on file    Physically abused: Not on file    Forced sexual activity: Not on file  Other Topics  Concern  . Not on file  Social History Narrative  . Not on file    Functional Status Survey:    Family History  Problem Relation Age of Onset  . Cancer Maternal Grandfather     Health Maintenance  Topic Date Due  . DEXA SCAN  04/23/2019 (Originally [birth year]/02/1992)  . TETANUS/TDAP  04/23/2019 (Originally 04/02/1944)  . PNA vac Low Risk Adult (1 of 2 - PCV13) 04/23/2019 (Originally 04/02/1992)  . INFLUENZA VACCINE  06/18/2019    Allergies  Allergen Reactions  . Cymbalta [Duloxetine Hcl]     " couldn't make water"- unable to urinate    Outpatient Encounter Medications as of 03/28/2019  Medication Sig  . acetaminophen (TYLENOL) 500 MG tablet Take 1,000 mg by mouth every 8 (eight) hours.   Marland Kitchen amLODipine (NORVASC) 5 MG tablet Take 1 tablet (5 mg total) by mouth daily.  . celecoxib (CELEBREX) 200 MG capsule Take 1 capsule (200 mg total) by mouth 2 (two) times daily.  . collagenase (SANTYL) ointment Apply 1 application topically daily.  Mariane Baumgarten Sodium 100 MG capsule Take 100 mg by mouth 2 (two) times daily.  . feeding supplement, ENSURE ENLIVE, (ENSURE ENLIVE) LIQD Take 237 mLs by mouth 2 (two) times daily between meals.  . Lidocaine 4 % PTCH Apply to lower back int he AM and remove at night for chronic back pain  . LORazepam (ATIVAN) 0.5 MG tablet Take 1 tablet (0.5 mg total) by mouth 2 (two) times daily as needed for up to 13 days for anxiety.  Marland Kitchen lubiprostone (AMITIZA) 24 MCG capsule Take 24 mcg by mouth 2 (two) times daily.  . metoprolol tartrate (LOPRESSOR) 25 MG tablet Take 1 tablet (25 mg total) by mouth 2 (two) times daily.  . Multiple Vitamins-Minerals (MULTIVITAMIN WITH MINERALS) tablet Take 1 tablet by mouth daily.  . NON FORMULARY Diet Type:   NAS  . ondansetron (ZOFRAN-ODT) 4 MG disintegrating tablet Take 4 mg by mouth every 6 (six) hours as needed for nausea or vomiting.   . Oxycodone HCl 10 MG TABS Take 1 tablet (10 mg total) by mouth every 8 (eight) hours as needed.  .  pantoprazole (PROTONIX) 20 MG tablet Take 1 tablet (20 mg total) by mouth daily.  . rivaroxaban (XARELTO) 10 MG TABS tablet Take 1 tablet (10 mg total) by mouth daily.  Marland Kitchen tiZANidine (ZANAFLEX) 4 MG tablet Take 4 mg by mouth 3 (three) times daily.  Marland Kitchen UNABLE TO FIND Wound Care -  Clean surgical incision right hip with n/s, apply dry dressing and change daily and as needed until staples are  removed  Clean wound right malleolar with N/S, apply santyl ointment and cover with allevyn foam dressing and change daily and as needed  . Vitamin D, Ergocalciferol, (DRISDOL) 50000 units CAPS capsule Take 50,000 Units by mouth See admin instructions. Twice a week. Days not specified on NH MAR.  Marland Kitchen Vitamins A & D (VITAMIN A & D) ointment Apply topically to callous left medial heel every shift and as needed   No facility-administered encounter medications on file as of 03/28/2019.     Review of Systems  Constitutional: Positive for activity change.  HENT: Negative.   Respiratory: Negative.   Cardiovascular: Negative.   Gastrointestinal: Negative.   Genitourinary: Negative.   Musculoskeletal: Positive for arthralgias.  Skin: Negative.   Neurological: Positive for weakness.  Psychiatric/Behavioral: Positive for confusion.    Vitals:   03/28/19 0922  BP: 124/70  Pulse: 70  Resp: 20  Temp: (!) 97.2 F (36.2 C)  Weight: 132 lb 1.6 oz (59.9 kg)  Height: 5\' 11"  (1.803 m)   Body mass index is 18.42 kg/m. Physical Exam Vitals signs reviewed.  Constitutional:      Appearance: Normal appearance.  HENT:     Head: Normocephalic.     Nose: Nose normal.     Mouth/Throat:     Mouth: Mucous membranes are moist.     Pharynx: Oropharynx is clear.  Eyes:     Pupils: Pupils are equal, round, and reactive to light.  Neck:     Musculoskeletal: Neck supple.  Cardiovascular:     Rate and Rhythm: Normal rate and regular rhythm.     Pulses: Normal pulses.     Heart sounds: Normal heart sounds.  Pulmonary:      Effort: Pulmonary effort is normal. No respiratory distress.     Breath sounds: Normal breath sounds. No wheezing.  Abdominal:     General: Abdomen is flat. Bowel sounds are normal. There is no distension.     Palpations: Abdomen is soft.     Tenderness: There is no abdominal tenderness.  Musculoskeletal:     Comments: Mild Edema Bilateral  Skin:    General: Skin is warm and dry.  Neurological:     General: No focal deficit present.     Mental Status: She is alert.     Comments: Did not know the Name of place * No surgery found * her age or year  Psychiatric:        Mood and Affect: Mood normal.        Thought Content: Thought content normal.        Judgment: Judgment normal.     Labs reviewed: Basic Metabolic Panel: Recent Labs    03/19/19 0527 03/20/19 0623 03/28/19 0630  NA 139 139 136  K 3.5 3.7 4.1  CL 104 103 101  CO2 27 27 26   GLUCOSE 102* 120* 104*  BUN 18 13 30*  CREATININE 0.66 0.61 0.92  CALCIUM 8.3* 8.6* 9.3   Liver Function Tests: No results for input(s): AST, ALT, ALKPHOS, BILITOT, PROT, ALBUMIN in the last 8760 hours. No results for input(s): LIPASE, AMYLASE in the last 8760 hours. No results for input(s): AMMONIA in the last 8760 hours. CBC: Recent Labs    03/17/19 1724  03/20/19 0623 03/21/19 0521 03/28/19 0630  WBC 5.1   < > 6.0 4.8 5.2  NEUTROABS 4.2  --   --   --   --   HGB 12.0   < > 11.7* 11.0* 11.8*  HCT 38.5   < > 36.6 34.4* 35.9*  MCV 93.2   < > 90.6 91.5 90.0  PLT 189   < > 177 172 341   < > = values in this interval not displayed.   Cardiac Enzymes: No results for input(s): CKTOTAL, CKMB, CKMBINDEX, TROPONINI in the last 8760 hours. BNP: Invalid input(s): POCBNP No results found for: HGBA1C No results found for: TSH No results found for: VITAMINB12 No results found for: FOLATE No results found for: IRON, TIBC, FERRITIN  Imaging and Procedures obtained prior to SNF admission: No results found.  Assessment/Plan Benign  essential HTN BP controlled on Norvasc and now Low dose pf Lopressor  S/P right hip fracture WBAT On Low dose of Xarelto Pain seems to be controlled Working with therapy Follow up with Ortho  History of DVT (deep vein thrombosis) Continue on Xarelto for prophlaxis  Anxiety On Ativan Will be reviewed in 2 weeks  Sinus tachycardia Normal rate now On Low dose Lopressor Cognitive Impairment Continue Supportive Care    Family/ staff Communication:   Labs/tests ordered: Total time spent in this patient care encounter was  45_  minutes; greater than 50% of the visit spent counseling patient and staff, reviewing records , Labs and coordinating care for problems addressed at this encounter.

## 2019-03-29 ENCOUNTER — Encounter (HOSPITAL_COMMUNITY)
Admission: RE | Admit: 2019-03-29 | Discharge: 2019-03-29 | Disposition: A | Discharge status: Home / Self Care | Payer: Medicare Other | Source: Skilled Nursing Facility | Attending: *Deleted | Admitting: *Deleted

## 2019-03-30 ENCOUNTER — Encounter: Payer: Self-pay | Admitting: Adult Health

## 2019-03-30 ENCOUNTER — Non-Acute Institutional Stay (SKILLED_NURSING_FACILITY): Payer: Medicare Other | Admitting: Adult Health

## 2019-03-30 DIAGNOSIS — C50911 Malignant neoplasm of unspecified site of right female breast: Secondary | ICD-10-CM | POA: Diagnosis not present

## 2019-03-30 DIAGNOSIS — I10 Essential (primary) hypertension: Secondary | ICD-10-CM | POA: Diagnosis not present

## 2019-03-30 DIAGNOSIS — R000 Tachycardia, unspecified: Secondary | ICD-10-CM | POA: Diagnosis not present

## 2019-03-30 NOTE — Progress Notes (Signed)
Location:   [place] Room Number: 102 P Place of Service:  SNF (31)   CODE STATUS: Full Code  Allergies  Allergen Reactions  . Cymbalta [Duloxetine Hcl]     " couldn't make water"- unable to urinate    Chief Complaint  Patient presents with  . Medical Management of Chronic Issues    Benign essential HTN: sinus tachycardia; malignant neoplasm     HPI:  She is a short term rehab patient being seen for the management of her chronic illnesses: hypertension; tachycardia; breat cancer. She does have back pain and right hip; she is taking off the lidoderm patch. She denies any changes in her appetite. There are no reports of insomnia. No reports of fevers.   Past Medical History:  Diagnosis Date  . Arthritis   . Breast cancer ([place])   . Chronic back pain   . Closed wedge compression fracture of L1 vertebra ([place]) 2004  . Hypertension   . Sciatica     Past Surgical History:  Procedure Laterality Date  . APPENDECTOMY    . HIP PINNING,CANNULATED Right 03/18/2019   Procedure: CANNULATED HIP PINNING;  Surgeon: Carole Civil, MD;  Location: [facility];  Service: Orthopedics;  Laterality: Right;  11 am     Social History   Socioeconomic History  . Marital status: Married    Spouse name: Not on file  . Number of children: Not on file  . Years of education: Not on file  . Highest education level: Not on file  Occupational History  . Not on file  Social Needs  . Financial resource strain: Not on file  . Food insecurity:    Worry: Not on file    Inability: Not on file  . Transportation needs:    Medical: Not on file    Non-medical: Not on file  Tobacco Use  . Smoking status: Never Smoker  . Smokeless tobacco: Never Used  Substance and Sexual Activity  . Alcohol use: Not on file  . Drug use: Not on file  . Sexual activity: Never  Lifestyle  . Physical activity:    Days per week: Not on file    Minutes per session: Not on file  . Stress:  Not on file  Relationships  . Social connections:    Talks on phone: Not on file    Gets together: Not on file    Attends religious service: Not on file    Active member of club or organization: Not on file    Attends meetings of clubs or organizations: Not on file    Relationship status: Not on file  . Intimate partner violence:    Fear of current or ex partner: Not on file    Emotionally abused: Not on file    Physically abused: Not on file    Forced sexual activity: Not on file  Other Topics Concern  . Not on file  Social History Narrative  . Not on file   Family History  Problem Relation Age of Onset  . Cancer Maternal Grandfather       VITAL SIGNS BP 138/82   Pulse 83   Temp (!) 96.5 F (35.8 C)   Resp 20   Ht 5\' 11"  (1.803 m)   Wt 121 lb 3.2 oz (55 kg)   BMI 16.90 kg/m   Outpatient Encounter Medications as of 03/30/2019  Medication Sig  . acetaminophen (TYLENOL) 500 MG tablet Take 1,000 mg by mouth  every 8 (eight) hours.   Marland Kitchen amLODipine (NORVASC) 5 MG tablet Take 1 tablet (5 mg total) by mouth daily.  . celecoxib (CELEBREX) 200 MG capsule Take 1 capsule (200 mg total) by mouth 2 (two) times daily.  . collagenase (SANTYL) ointment Apply 1 application topically daily.  Mariane Baumgarten Sodium 100 MG capsule Take 100 mg by mouth 2 (two) times daily.  . feeding supplement, ENSURE ENLIVE, (ENSURE ENLIVE) LIQD Take 237 mLs by mouth 2 (two) times daily between meals.  . Lidocaine 4 % PTCH Apply to lower back int he AM and remove at night for chronic back pain  . LORazepam (ATIVAN) 0.5 MG tablet Take 1 tablet (0.5 mg total) by mouth 2 (two) times daily as needed for up to 13 days for anxiety.  Marland Kitchen lubiprostone (AMITIZA) 24 MCG capsule Take 24 mcg by mouth 2 (two) times daily.  . metoprolol tartrate (LOPRESSOR) 25 MG tablet Take 1 tablet (25 mg total) by mouth 2 (two) times daily.  . Multiple Vitamins-Minerals (MULTIVITAMIN WITH MINERALS) tablet Take 1 tablet by mouth daily.  .  NON FORMULARY Diet Type:   NAS  . ondansetron (ZOFRAN-ODT) 4 MG disintegrating tablet Take 4 mg by mouth every 6 (six) hours as needed for nausea or vomiting.   . Oxycodone HCl 10 MG TABS Take 1 tablet (10 mg total) by mouth every 8 (eight) hours as needed.  . pantoprazole (PROTONIX) 20 MG tablet Take 1 tablet (20 mg total) by mouth daily.  . rivaroxaban (XARELTO) 10 MG TABS tablet Take 1 tablet (10 mg total) by mouth daily.  Marland Kitchen tiZANidine (ZANAFLEX) 4 MG tablet Take 4 mg by mouth 3 (three) times daily.  Marland Kitchen UNABLE TO FIND Wound Care -  Clean surgical incision right hip with n/s, apply dry dressing and change daily and as needed until staples are removed  Clean wound right malleolar with N/S, apply santyl ointment and cover with allevyn foam dressing and change daily and as needed  . Vitamin D, Ergocalciferol, (DRISDOL) 50000 units CAPS capsule Take 50,000 Units by mouth See admin instructions. Twice a week on Tues, Saturday  . Vitamins A & D (VITAMIN A & D) ointment Apply topically to callous left medial heel every shift and as needed   No facility-administered encounter medications on file as of 03/30/2019.      SIGNIFICANT DIAGNOSTIC EXAMS  TODAY:   03-17-19: right hip x-ray; impacted femoral neck fracture on the right   03-17-19: chest x-ray; Emphysematous changes without infiltrate.  LABS REVIEWED PREVIOUS;   03-17-19: wbc 5.1; hgb 12.0; hct 38.5; mcv 93.2 plt 189; glucose 116; bun 21; creat 0.83; k+3.8; na++ 139; ca 9.0  03-18-19: wbc 4.8; hgb 11.1; hct 34.7; mcv 90.8 ;plt 167; glucose 106; bun 17; creat 0.65; k+ 3.5; na++ 140; ca 8.6 03-20-19: wbc 6.0; hgb 11.7; hct 36.6; mcv 90.6; plt 177; glucose 120; bun 13; creat 0.61; k+ 3.7; na ++ 139; ca 8.6   LABS REVIEWED TODAY:   03-21-19: wbc 4.8; hgb 11.0; hct 34.4; mcv 91.5 plt 172;  03-28-19: wbc 5.2; hgb 11.8 hct 35. 9 mcv 90.0 plt 341; glucose 104; bun 30; creat 0.92; k+ 4.1; na++ 136; ca 9.3    Review of Systems  Constitutional:  Negative for malaise/fatigue.  Respiratory: Negative for cough and shortness of breath.   Cardiovascular: Negative for chest pain, palpitations and leg swelling.  Gastrointestinal: Negative for abdominal pain, constipation and heartburn.  Musculoskeletal: Positive for back pain and joint pain. Negative  for myalgias.       Back and right hip pain   Skin: Negative.   Neurological: Negative for dizziness.  Psychiatric/Behavioral: The patient is not nervous/anxious.     Physical Exam Constitutional:      General: She is not in acute distress.    Appearance: She is well-developed. She is not diaphoretic.     Comments: thin  Neck:     Musculoskeletal: Neck supple.     Thyroid: No thyromegaly.  Cardiovascular:     Rate and Rhythm: Normal rate and regular rhythm.     Pulses: Normal pulses.     Heart sounds: Normal heart sounds.  Pulmonary:     Effort: Pulmonary effort is normal. No respiratory distress.     Breath sounds: Normal breath sounds.  Chest:     Comments: History of right mastectomy  Abdominal:     General: Bowel sounds are normal. There is no distension.     Palpations: Abdomen is soft.     Tenderness: There is no abdominal tenderness.  Musculoskeletal:     Right lower leg: No edema.     Left lower leg: No edema.     Comments:  Is able to move all extremities Is status post right femoral neck fracture    Lymphadenopathy:     Cervical: No cervical adenopathy.  Skin:    General: Skin is warm and dry.     Comments: Right medical ankle stasis ulcer: yellow slough present; no signs of infection present.  Left heel callus Right hip incision line without signs of infection present    Neurological:     Mental Status: She is alert. Mental status is at baseline.  Psychiatric:        Mood and Affect: Mood normal.      ASSESSMENT/ PLAN:  TODAY:   1. Benign essential HTN: is stable b/p 138/82 will continue norvasc 5 mg daily and lopressor 25 mg twice daily   2. Sinus  tachycardia: heart rate stable will continue lopressor 25 mg twice daily for rate control  3. Breast cancer stage 1: is stable is status post right mastectomy   PREVIOUS   4. Closed fracture of right hip with routine healing subsequent encounter: is stable will continue therapy as directed and will follow up with orthopedics; will continue oxycodone 10 mg every 8 hours as needed xarelto 10 mg daily for 30 days and tylenol 1 gm three times daily   5. Chronic bilateral low back pain without sciatica: is without change: will continue celebrex 200 mg twice daily will continue oxycodone 10 mg every 8 hours as needed will stop lidoderm patch ineffective; will increase zanaflex 4 mg every 6 hours  6. GERD without esophagitis: is stable will continue protonix 20 mg daily   7. Constipation due to opioid therapy. Is stable will continue colace twice daily and amitiza 24 mcg twice daily   8. Vit D deficiency: is stable will continue vit D 50,000 units twice weekly   9. Anxiety is stable will continue ativan 0.5 mg twice daily as needed for 14 days.       MD is aware of resident's narcotic use and is in agreement with current plan of care. We will attempt to wean resident as apropriate   Ok Edwards NP [facility]  Contact [phone] Monday through Friday 8am- 5pm  After hours call [phone]

## 2019-04-01 ENCOUNTER — Ambulatory Visit: Payer: Medicare Other | Admitting: Orthopedic Surgery

## 2019-04-04 ENCOUNTER — Encounter: Payer: Self-pay | Admitting: Adult Health

## 2019-04-04 ENCOUNTER — Encounter (HOSPITAL_COMMUNITY)
Admission: RE | Admit: 2019-04-04 | Discharge: 2019-04-04 | Disposition: A | Discharge status: Home / Self Care | Payer: Medicare Other | Source: Skilled Nursing Facility | Attending: *Deleted | Admitting: *Deleted

## 2019-04-04 ENCOUNTER — Non-Acute Institutional Stay (SKILLED_NURSING_FACILITY): Payer: Medicare Other | Admitting: Adult Health

## 2019-04-04 ENCOUNTER — Other Ambulatory Visit: Payer: Self-pay | Admitting: Adult Health

## 2019-04-04 DIAGNOSIS — S72001D Fracture of unspecified part of neck of right femur, subsequent encounter for closed fracture with routine healing: Secondary | ICD-10-CM | POA: Diagnosis not present

## 2019-04-04 DIAGNOSIS — F419 Anxiety disorder, unspecified: Secondary | ICD-10-CM

## 2019-04-04 LAB — COMPREHENSIVE METABOLIC PANEL
ALT: 14 U/L (ref 0–44)
AST: 19 U/L (ref 15–41)
Albumin: 3.7 g/dL (ref 3.5–5.0)
Alkaline Phosphatase: 89 U/L (ref 38–126)
Anion gap: 10 (ref 5–15)
BUN: 39 mg/dL — ABNORMAL HIGH (ref 8–23)
CO2: 25 mmol/L (ref 22–32)
Calcium: 9.4 mg/dL (ref 8.9–10.3)
Chloride: 104 mmol/L (ref 98–111)
Creatinine, Ser: 0.8 mg/dL (ref 0.44–1.00)
GFR calc Af Amer: >60 mL/min (ref >60)
GFR calc non Af Amer: >60 mL/min (ref >60)
Glucose, Bld: 114 mg/dL — ABNORMAL HIGH (ref 70–99)
Potassium: 4.3 mmol/L (ref 3.5–5.1)
Sodium: 139 mmol/L (ref 135–145)
Total Bilirubin: 0.5 mg/dL (ref 0.3–1.2)
Total Protein: 6.2 g/dL — ABNORMAL LOW (ref 6.5–8.1)

## 2019-04-04 LAB — CBC
HCT: 36.7 % (ref 36.0–46.0)
Hemoglobin: 11.5 g/dL — ABNORMAL LOW (ref 12.0–15.0)
MCH: 28.6 pg (ref 26.0–34.0)
MCHC: 31.3 g/dL (ref 30.0–36.0)
MCV: 91.3 fL (ref 80.0–100.0)
Platelets: 325 10*3/uL (ref 150–400)
RBC: 4.02 MIL/uL (ref 3.87–5.11)
RDW: 13.7 % (ref 11.5–15.5)
WBC: 5.7 10*3/uL (ref 4.0–10.5)
nRBC: 0 % (ref 0.0–0.2)

## 2019-04-04 MED ORDER — LORAZEPAM 0.5 MG PO TABS: 0.5000 mg | ORAL_TABLET | Freq: Two times a day (BID) | ORAL | 0 refills | Status: AC | PRN

## 2019-04-04 MED ORDER — OXYCODONE HCL 5 MG PO TABS: 5.0000 mg | ORAL_TABLET | ORAL | 0 refills | Status: DC | PRN

## 2019-04-04 NOTE — Telephone Encounter (Signed)
Followed up with [facility] - faxed note due to schedulers not available at this time.

## 2019-04-04 NOTE — Telephone Encounter (Signed)
If assymptomatic push appointment out 4 weeks

## 2019-04-04 NOTE — Progress Notes (Signed)
Location:   [place] Room Number: 102 { Place of Service:  SNF (31)   CODE STATUS: Full Code  Allergies  Allergen Reactions  . Cymbalta [Duloxetine Hcl]     " couldn't make water"- unable to urinate    Chief Complaint  Patient presents with  . Acute Visit    Anxiety    HPI:  She is on ativan 0.5 mg twice daily as needed for anxiety. She is presently on oxycodone 5 mg every 4 hours as needed for pain management. She is status post right hip pinning on 03-18-19. She continually asks for medications; whether she has had her medication or not. She is unable to describe her pain. She does spend nearly all of her time in bed per her choice. There are no reports of changes in appetite; no reports of fevers present.    Past Medical History:  Diagnosis Date  . Arthritis   . Breast cancer ([place])   . Chronic back pain   . Closed wedge compression fracture of L1 vertebra ([place]) 2004  . Hypertension   . Sciatica     Past Surgical History:  Procedure Laterality Date  . APPENDECTOMY    . HIP PINNING,CANNULATED Right 03/18/2019   Procedure: CANNULATED HIP PINNING;  Surgeon: Carole Civil, MD;  Location: [facility];  Service: Orthopedics;  Laterality: Right;  11 am     Social History   Socioeconomic History  . Marital status: Married    Spouse name: Not on file  . Number of children: Not on file  . Years of education: Not on file  . Highest education level: Not on file  Occupational History  . Not on file  Social Needs  . Financial resource strain: Not on file  . Food insecurity:    Worry: Not on file    Inability: Not on file  . Transportation needs:    Medical: Not on file    Non-medical: Not on file  Tobacco Use  . Smoking status: Never Smoker  . Smokeless tobacco: Never Used  Substance and Sexual Activity  . Alcohol use: Not on file  . Drug use: Not on file  . Sexual activity: Never  Lifestyle  . Physical activity:    Days per week:  Not on file    Minutes per session: Not on file  . Stress: Not on file  Relationships  . Social connections:    Talks on phone: Not on file    Gets together: Not on file    Attends religious service: Not on file    Active member of club or organization: Not on file    Attends meetings of clubs or organizations: Not on file    Relationship status: Not on file  . Intimate partner violence:    Fear of current or ex partner: Not on file    Emotionally abused: Not on file    Physically abused: Not on file    Forced sexual activity: Not on file  Other Topics Concern  . Not on file  Social History Narrative  . Not on file   Family History  Problem Relation Age of Onset  . Cancer Maternal Grandfather       VITAL SIGNS BP 125/78   Pulse 67   Temp 98.2 F (36.8 C)   Resp 18   Ht 5\' 11"  (1.803 m)   Wt 121 lb 3.2 oz (55 kg)   BMI 16.90 kg/m   Outpatient  Encounter Medications as of 04/04/2019  Medication Sig  . acetaminophen (TYLENOL) 500 MG tablet Take 1,000 mg by mouth every 8 (eight) hours.   Marland Kitchen amLODipine (NORVASC) 5 MG tablet Take 1 tablet (5 mg total) by mouth daily.  . celecoxib (CELEBREX) 200 MG capsule Take 1 capsule (200 mg total) by mouth 2 (two) times daily.  . collagenase (SANTYL) ointment Apply 1 application topically daily.  Mariane Baumgarten Sodium 100 MG capsule Take 100 mg by mouth 2 (two) times daily.  . feeding supplement, ENSURE ENLIVE, (ENSURE ENLIVE) LIQD Take 237 mLs by mouth 2 (two) times daily between meals.  Marland Kitchen LORazepam (ATIVAN) 0.5 MG tablet Take 1 tablet (0.5 mg total) by mouth 2 (two) times daily as needed for up to 13 days for anxiety.  Marland Kitchen lubiprostone (AMITIZA) 24 MCG capsule Take 24 mcg by mouth 2 (two) times daily.  . metoprolol tartrate (LOPRESSOR) 25 MG tablet Take 1 tablet (25 mg total) by mouth 2 (two) times daily.  . Multiple Vitamins-Minerals (MULTIVITAMIN WITH MINERALS) tablet Take 1 tablet by mouth daily.  . NON FORMULARY Diet Type:   NAS  .  ondansetron (ZOFRAN-ODT) 4 MG disintegrating tablet Take 4 mg by mouth every 6 (six) hours as needed for nausea or vomiting.   . pantoprazole (PROTONIX) 20 MG tablet Take 1 tablet (20 mg total) by mouth daily.  . rivaroxaban (XARELTO) 10 MG TABS tablet Take 1 tablet (10 mg total) by mouth daily.  Marland Kitchen tiZANidine (ZANAFLEX) 4 MG tablet Take 4 mg by mouth every 6 (six) hours.   Marland Kitchen UNABLE TO FIND Wound Care -  Clean surgical incision right hip with n/s, apply dry dressing and change daily and as needed until staples are removed  Clean wound right malleolar with N/S, apply santyl ointment and cover with allevyn foam dressing and change daily and as needed  . Vitamin D, Ergocalciferol, (DRISDOL) 50000 units CAPS capsule Take 50,000 Units by mouth See admin instructions. Twice a week on Tues, Saturday  . Vitamins A & D (VITAMIN A & D) ointment Apply topically to callous left medial heel every shift and as needed  . [DISCONTINUED] Lidocaine 4 % PTCH Apply to lower back int he AM and remove at night for chronic back pain  . [DISCONTINUED] Oxycodone HCl 10 MG TABS Take 1 tablet (10 mg total) by mouth every 8 (eight) hours as needed. (Patient not taking: Reported on 04/04/2019)   No facility-administered encounter medications on file as of 04/04/2019.      SIGNIFICANT DIAGNOSTIC EXAMS  TODAY:   03-17-19: right hip x-ray; impacted femoral neck fracture on the right   03-17-19: chest x-ray; Emphysematous changes without infiltrate.  LABS REVIEWED PREVIOUS;   03-17-19: wbc 5.1; hgb 12.0; hct 38.5; mcv 93.2 plt 189; glucose 116; bun 21; creat 0.83; k+3.8; na++ 139; ca 9.0  03-18-19: wbc 4.8; hgb 11.1; hct 34.7; mcv 90.8 ;plt 167; glucose 106; bun 17; creat 0.65; k+ 3.5; na++ 140; ca 8.6 03-20-19: wbc 6.0; hgb 11.7; hct 36.6; mcv 90.6; plt 177; glucose 120; bun 13; creat 0.61; k+ 3.7; na ++ 139; ca 8.6  03-21-19: wbc 4.8; hgb 11.0; hct 34.4; mcv 91.5 plt 172;  03-28-19: wbc 5.2; hgb 11.8 hct 35. 9 mcv 90.0 plt 341;  glucose 104; bun 30; creat 0.92; k+ 4.1; na++ 136; ca 9.3   NO NEW LABS.   Review of Systems  Reason unable to perform ROS: poor historian   Constitutional: Negative for malaise/fatigue.  Respiratory: Negative  for cough.   Cardiovascular: Negative for chest pain.  Gastrointestinal: Negative for abdominal pain.  Musculoskeletal: Positive for back pain. Negative for joint pain and myalgias.  Skin: Negative.   Psychiatric/Behavioral: The patient is not nervous/anxious.     Physical Exam Constitutional:      General: She is not in acute distress.    Appearance: She is well-developed. She is not diaphoretic.     Comments: thin  Neck:     Thyroid: No thyromegaly.  Cardiovascular:     Rate and Rhythm: Normal rate and regular rhythm.     Heart sounds: Normal heart sounds.  Pulmonary:     Effort: Pulmonary effort is normal. No respiratory distress.     Breath sounds: Normal breath sounds.  Chest:     Comments:  History of right mastectomy  Abdominal:     General: Bowel sounds are normal. There is no distension.     Palpations: Abdomen is soft.     Tenderness: There is no abdominal tenderness.  Musculoskeletal:     Right lower leg: No edema.     Left lower leg: No edema.     Comments: Is able to move all extremities Is status post right femoral neck fracture     Lymphadenopathy:     Cervical: No cervical adenopathy.  Skin:    General: Skin is warm and dry.     Comments: Right medical ankle stasis ulcer: yellow slough present; no signs of infection present.  Left heel callus Right hip incision line without signs of infection present     Neurological:     Mental Status: She is alert. Mental status is at baseline.  Psychiatric:        Mood and Affect: Mood normal.      ASSESSMENT/ PLAN:  TODAY:   1. Closed fracture of right hip with routine healing subsequent encounter 2. Anxiety  Will continue ativan 0.5 mg twice daily as needed through 04-18-19 Will change oxycodone  to 5 mg every 6 hours as needed through 04-11-19.       MD is aware of resident's narcotic use and is in agreement with current plan of care. We will attempt to wean resident as apropriate   Ok Edwards NP [facility]  Contact [phone] Monday through Friday 8am- 5pm  After hours call [phone]

## 2019-04-04 NOTE — Telephone Encounter (Signed)
Call received from [facility], [place], [place] [phone], following scheduling of hospital follow up/post operative appointment for 04/06/19 (it had been re-scheduled due to transportation from 04/01/19).  States that nurse has already removed sutures/staples.  Asking if patient still needs to come to in-person appointment?  Please advise.

## 2019-04-05 ENCOUNTER — Non-Acute Institutional Stay (SKILLED_NURSING_FACILITY): Payer: Medicare Other | Admitting: Adult Health

## 2019-04-05 ENCOUNTER — Encounter: Payer: Self-pay | Admitting: Adult Health

## 2019-04-05 DIAGNOSIS — I83013 Varicose veins of right lower extremity with ulcer of ankle: Secondary | ICD-10-CM

## 2019-04-05 DIAGNOSIS — L97319 Non-pressure chronic ulcer of right ankle with unspecified severity: Secondary | ICD-10-CM

## 2019-04-05 DIAGNOSIS — S72001D Fracture of unspecified part of neck of right femur, subsequent encounter for closed fracture with routine healing: Secondary | ICD-10-CM | POA: Diagnosis not present

## 2019-04-05 NOTE — Telephone Encounter (Signed)
Appointment has been re-scheduled; spoke with Fredderick Severance at [facility]; aware.

## 2019-04-05 NOTE — Progress Notes (Signed)
Location:   [place] Room Number: 102 P Place of Service:  SNF (31)    CODE STATUS: Full Code  Allergies  Allergen Reactions  . Cymbalta [Duloxetine Hcl]     " couldn't make water"- unable to urinate    Chief Complaint  Patient presents with  . Discharge Note    Discharging to ALF on 04/08/2019    HPI:  She is being discharged to her assisted living with home health for pt/ot/rn. She will not need any dme. Her medications will be provided by the receiving facility. She will follow up medically by the provider at the assisted living.  She had been hospitalized for right hip fracture after a fall. She was admitted to this facility for short term rehab. She does spend most of her time in bed per her choice; and is now ready to return to the assisted living.     Past Medical History:  Diagnosis Date  . Arthritis   . Breast cancer ([place])   . Chronic back pain   . Closed wedge compression fracture of L1 vertebra ([place]) 2004  . Hypertension   . Sciatica     Past Surgical History:  Procedure Laterality Date  . APPENDECTOMY    . HIP PINNING,CANNULATED Right 03/18/2019   Procedure: CANNULATED HIP PINNING;  Surgeon: Carole Civil, MD;  Location: [facility];  Service: Orthopedics;  Laterality: Right;  11 am     Social History   Socioeconomic History  . Marital status: Married    Spouse name: Not on file  . Number of children: Not on file  . Years of education: Not on file  . Highest education level: Not on file  Occupational History  . Not on file  Social Needs  . Financial resource strain: Not on file  . Food insecurity:    Worry: Not on file    Inability: Not on file  . Transportation needs:    Medical: Not on file    Non-medical: Not on file  Tobacco Use  . Smoking status: Never Smoker  . Smokeless tobacco: Never Used  Substance and Sexual Activity  . Alcohol use: Not on file  . Drug use: Not on file  . Sexual activity: Never   Lifestyle  . Physical activity:    Days per week: Not on file    Minutes per session: Not on file  . Stress: Not on file  Relationships  . Social connections:    Talks on phone: Not on file    Gets together: Not on file    Attends religious service: Not on file    Active member of club or organization: Not on file    Attends meetings of clubs or organizations: Not on file    Relationship status: Not on file  . Intimate partner violence:    Fear of current or ex partner: Not on file    Emotionally abused: Not on file    Physically abused: Not on file    Forced sexual activity: Not on file  Other Topics Concern  . Not on file  Social History Narrative  . Not on file   Family History  Problem Relation Age of Onset  . Cancer Maternal Grandfather     VITAL SIGNS BP 121/74   Pulse 68   Temp 97.9 F (36.6 C)   Resp 17   Ht 5\' 11"  (1.803 m)   Wt 116 lb (52.6 kg)   BMI 16.18  kg/m   Patient's Medications  New Prescriptions   No medications on file  Previous Medications   ACETAMINOPHEN (TYLENOL) 500 MG TABLET    Take 1,000 mg by mouth every 8 (eight) hours.    AMLODIPINE (NORVASC) 5 MG TABLET    Take 1 tablet (5 mg total) by mouth daily.   CELECOXIB (CELEBREX) 200 MG CAPSULE    Take 1 capsule (200 mg total) by mouth 2 (two) times daily.   COLLAGENASE (SANTYL) OINTMENT    Apply 1 application topically daily.   DOCUSATE SODIUM 100 MG CAPSULE    Take 100 mg by mouth 2 (two) times daily.   FEEDING SUPPLEMENT, ENSURE ENLIVE, (ENSURE ENLIVE) LIQD    Take 237 mLs by mouth 2 (two) times daily between meals.   LORAZEPAM (ATIVAN) 0.5 MG TABLET    Take 1 tablet (0.5 mg total) by mouth 2 (two) times daily as needed for up to 14 days for anxiety.   LUBIPROSTONE (AMITIZA) 24 MCG CAPSULE    Take 24 mcg by mouth 2 (two) times daily.   METOPROLOL TARTRATE (LOPRESSOR) 25 MG TABLET    Take 1 tablet (25 mg total) by mouth 2 (two) times daily.   MULTIPLE VITAMINS-MINERALS (MULTIVITAMIN WITH  MINERALS) TABLET    Take 1 tablet by mouth daily.   NON FORMULARY    Diet Type:   NAS   ONDANSETRON (ZOFRAN-ODT) 4 MG DISINTEGRATING TABLET    Take 4 mg by mouth every 6 (six) hours as needed for nausea or vomiting.    OXYCODONE (ROXICODONE) 5 MG IMMEDIATE RELEASE TABLET    Take 1 tablet (5 mg total) by mouth every 4 (four) hours as needed for up to 7 days for severe pain.   PANTOPRAZOLE (PROTONIX) 20 MG TABLET    Take 1 tablet (20 mg total) by mouth daily.   RIVAROXABAN (XARELTO) 10 MG TABS TABLET    Take 1 tablet (10 mg total) by mouth daily.   TIZANIDINE (ZANAFLEX) 4 MG TABLET    Take 4 mg by mouth every 6 (six) hours.    UNABLE TO FIND    Wound Care -   Clean wound right malleolar with N/S, apply santyl ointment and cover with allevyn foam dressing and change daily and as needed   VITAMIN D, ERGOCALCIFEROL, (DRISDOL) 50000 UNITS CAPS CAPSULE    Take 50,000 Units by mouth See admin instructions. Twice a week on Tues, Saturday   VITAMINS A & D (VITAMIN A & D) OINTMENT    Apply topically to callous left medial heel every shift and as needed  Modified Medications   No medications on file  Discontinued Medications   No medications on file     SIGNIFICANT DIAGNOSTIC EXAMS  PREVIOUS:   03-17-19: right hip x-ray; impacted femoral neck fracture on the right   03-17-19: chest x-ray; Emphysematous changes without infiltrate.  LABS REVIEWED PREVIOUS;   03-17-19: wbc 5.1; hgb 12.0; hct 38.5; mcv 93.2 plt 189; glucose 116; bun 21; creat 0.83; k+3.8; na++ 139; ca 9.0  03-18-19: wbc 4.8; hgb 11.1; hct 34.7; mcv 90.8 ;plt 167; glucose 106; bun 17; creat 0.65; k+ 3.5; na++ 140; ca 8.6 03-20-19: wbc 6.0; hgb 11.7; hct 36.6; mcv 90.6; plt 177; glucose 120; bun 13; creat 0.61; k+ 3.7; na ++ 139; ca 8.6  03-21-19: wbc 4.8; hgb 11.0; hct 34.4; mcv 91.5 plt 172;  03-28-19: wbc 5.2; hgb 11.8 hct 35. 9 mcv 90.0 plt 341; glucose 104; bun 30; creat 0.92; k+  4.1; na++ 136; ca 9.3   NO NEW LABS.   Review of Systems   Reason unable to perform ROS: poor historian   Constitutional: Negative for malaise/fatigue.  Respiratory: Negative for cough.   Cardiovascular: Negative for chest pain.  Gastrointestinal: Negative for abdominal pain.  Musculoskeletal: Positive for back pain. Negative for joint pain and myalgias.  Skin: Negative.   Psychiatric/Behavioral: The patient is not nervous/anxious.       Physical Exam Constitutional:      General: She is not in acute distress.    Appearance: She is well-developed. She is not diaphoretic.     Comments: thin  Neck:     Musculoskeletal: Neck supple.     Thyroid: No thyromegaly.  Cardiovascular:     Rate and Rhythm: Normal rate and regular rhythm.     Pulses: Normal pulses.     Heart sounds: Normal heart sounds.  Pulmonary:     Effort: Pulmonary effort is normal. No respiratory distress.     Breath sounds: Normal breath sounds.  Chest:     Comments: History of right mastectomy   Abdominal:     General: Bowel sounds are normal. There is no distension.     Palpations: Abdomen is soft.     Tenderness: There is no abdominal tenderness.  Musculoskeletal:     Right lower leg: No edema.     Left lower leg: No edema.     Comments:  Is able to move all extremities Is status post right femoral neck fracture      Lymphadenopathy:     Cervical: No cervical adenopathy.  Skin:    General: Skin is warm and dry.     Comments: Right medical ankle stasis ulcer: no signs of infection present.  Left heel callus Right hip incision line without signs of infection present       Neurological:     Mental Status: She is alert. Mental status is at baseline.  Psychiatric:        Mood and Affect: Mood normal.       ASSESSMENT/ PLAN:  Patient is being discharged with the following home health services:  Pt/ot/rn: to evaluate and treat as indicated for gait balance strength adl training wound management   Patient is being discharged with the following durable  medical equipment:  None needed   Patient has been advised to f/u with their PCP in 1-2 weeks to bring them up to date on their rehab stay.  Social services at facility was responsible for arranging this appointment.  Pt was provided with a 30 day supply of prescriptions for medications and refills must be obtained from their PCP.  For controlled substances, a more limited supply may be provided adequate until PCP appointment only.   Her medications will be provided by the receiving facility.   Time spent with patient: 35 minutes: for discharge process home health needs    Ok Edwards NP [facility]  Contact [phone] Monday through Friday 8am- 5pm  After hours call [phone]

## 2019-04-06 ENCOUNTER — Ambulatory Visit: Payer: Medicare Other | Admitting: Orthopedic Surgery

## 2019-04-06 ENCOUNTER — Non-Acute Institutional Stay (SKILLED_NURSING_FACILITY): Payer: Medicare Other | Admitting: Adult Health

## 2019-04-06 ENCOUNTER — Other Ambulatory Visit: Payer: Self-pay | Admitting: Adult Health

## 2019-04-06 ENCOUNTER — Encounter: Payer: Self-pay | Admitting: Adult Health

## 2019-04-06 DIAGNOSIS — M545 Low back pain: Secondary | ICD-10-CM

## 2019-04-06 DIAGNOSIS — S72001D Fracture of unspecified part of neck of right femur, subsequent encounter for closed fracture with routine healing: Secondary | ICD-10-CM

## 2019-04-06 DIAGNOSIS — L97319 Non-pressure chronic ulcer of right ankle with unspecified severity: Secondary | ICD-10-CM

## 2019-04-06 DIAGNOSIS — I83013 Varicose veins of right lower extremity with ulcer of ankle: Secondary | ICD-10-CM

## 2019-04-06 DIAGNOSIS — G8929 Other chronic pain: Secondary | ICD-10-CM

## 2019-04-06 MED ORDER — OXYCODONE HCL 5 MG PO TABS: 5.0000 mg | ORAL_TABLET | Freq: Four times a day (QID) | ORAL | 0 refills | Status: AC | PRN

## 2019-04-06 NOTE — Progress Notes (Signed)
Location:   [place] Room Number: 102 P Place of Service:  SNF (31)   CODE STATUS: Full Code  Allergies  Allergen Reactions  . Cymbalta [Duloxetine Hcl]     " couldn't make water"- unable to urinate    Chief Complaint  Patient presents with  . Acute Visit    Care Plan Meeting    HPI:  We have come together for her medicare care plan meeting. She has family present via phone. She will be returning to her previous assisted living. She has chronic back pain; she is being weaned off her level 2 narcotics without change in her pain relief. There are no reports of changes in appetite; no reports of agitation present. She does spend a great deal of time in bed per her choice. There are no reports of fevers present.    Past Medical History:  Diagnosis Date  . Arthritis   . Breast cancer ([place])   . Chronic back pain   . Closed wedge compression fracture of L1 vertebra ([place]) 2004  . Hypertension   . Sciatica     Past Surgical History:  Procedure Laterality Date  . APPENDECTOMY    . HIP PINNING,CANNULATED Right 03/18/2019   Procedure: CANNULATED HIP PINNING;  Surgeon: Carole Civil, MD;  Location: [facility];  Service: Orthopedics;  Laterality: Right;  11 am     Social History   Socioeconomic History  . Marital status: Married    Spouse name: Not on file  . Number of children: Not on file  . Years of education: Not on file  . Highest education level: Not on file  Occupational History  . Not on file  Social Needs  . Financial resource strain: Not on file  . Food insecurity:    Worry: Not on file    Inability: Not on file  . Transportation needs:    Medical: Not on file    Non-medical: Not on file  Tobacco Use  . Smoking status: Never Smoker  . Smokeless tobacco: Never Used  Substance and Sexual Activity  . Alcohol use: Not on file  . Drug use: Not on file  . Sexual activity: Never  Lifestyle  . Physical activity:    Days per week:  Not on file    Minutes per session: Not on file  . Stress: Not on file  Relationships  . Social connections:    Talks on phone: Not on file    Gets together: Not on file    Attends religious service: Not on file    Active member of club or organization: Not on file    Attends meetings of clubs or organizations: Not on file    Relationship status: Not on file  . Intimate partner violence:    Fear of current or ex partner: Not on file    Emotionally abused: Not on file    Physically abused: Not on file    Forced sexual activity: Not on file  Other Topics Concern  . Not on file  Social History Narrative  . Not on file   Family History  Problem Relation Age of Onset  . Cancer Maternal Grandfather       VITAL SIGNS BP 121/74   Pulse 68   Temp 97.9 F (36.6 C)   Resp 17   Ht 5\' 11"  (1.803 m)   Wt 116 lb (52.6 kg)   BMI 16.18 kg/m   Outpatient Encounter Medications as of  04/06/2019  Medication Sig  . acetaminophen (TYLENOL) 500 MG tablet Take 1,000 mg by mouth every 8 (eight) hours.   Marland Kitchen amLODipine (NORVASC) 5 MG tablet Take 1 tablet (5 mg total) by mouth daily.  . celecoxib (CELEBREX) 200 MG capsule Take 1 capsule (200 mg total) by mouth 2 (two) times daily.  . clotrimazole-betamethasone (LOTRISONE) cream Apply 1 application topically. Apply every shift  . collagenase (SANTYL) ointment Apply 1 application topically daily.  Mariane Baumgarten Sodium 100 MG capsule Take 100 mg by mouth 2 (two) times daily.  . feeding supplement, ENSURE ENLIVE, (ENSURE ENLIVE) LIQD Take 237 mLs by mouth 2 (two) times daily between meals.  Marland Kitchen LORazepam (ATIVAN) 0.5 MG tablet Take 1 tablet (0.5 mg total) by mouth 2 (two) times daily as needed for up to 14 days for anxiety.  Marland Kitchen lubiprostone (AMITIZA) 24 MCG capsule Take 24 mcg by mouth 2 (two) times daily.  . metoprolol tartrate (LOPRESSOR) 25 MG tablet Take 1 tablet (25 mg total) by mouth 2 (two) times daily.  . Multiple Vitamins-Minerals (MULTIVITAMIN  WITH MINERALS) tablet Take 1 tablet by mouth daily.  . NON FORMULARY Diet Type:   NAS  . ondansetron (ZOFRAN-ODT) 4 MG disintegrating tablet Take 4 mg by mouth every 6 (six) hours as needed for nausea or vomiting.   Marland Kitchen oxyCODONE (ROXICODONE) 5 MG immediate release tablet Take 1 tablet (5 mg total) by mouth every 6 (six) hours as needed for up to 6 days for severe pain.  . pantoprazole (PROTONIX) 20 MG tablet Take 1 tablet (20 mg total) by mouth daily.  . rivaroxaban (XARELTO) 10 MG TABS tablet Take 1 tablet (10 mg total) by mouth daily.  Marland Kitchen tiZANidine (ZANAFLEX) 4 MG tablet Take 4 mg by mouth every 6 (six) hours.   Marland Kitchen UNABLE TO FIND Wound Care -   Clean wound right malleolar with N/S, apply santyl ointment and cover with allevyn foam dressing and change daily and as needed  . Vitamin D, Ergocalciferol, (DRISDOL) 50000 units CAPS capsule Take 50,000 Units by mouth See admin instructions. Twice a week on Tues, Saturday  . Vitamins A & D (VITAMIN A & D) ointment Apply topically to callous left medial heel every shift and as needed   No facility-administered encounter medications on file as of 04/06/2019.      SIGNIFICANT DIAGNOSTIC EXAMS   PREVIOUS:   03-17-19: right hip x-ray; impacted femoral neck fracture on the right   03-17-19: chest x-ray; Emphysematous changes without infiltrate.  LABS REVIEWED PREVIOUS;   03-17-19: wbc 5.1; hgb 12.0; hct 38.5; mcv 93.2 plt 189; glucose 116; bun 21; creat 0.83; k+3.8; na++ 139; ca 9.0  03-18-19: wbc 4.8; hgb 11.1; hct 34.7; mcv 90.8 ;plt 167; glucose 106; bun 17; creat 0.65; k+ 3.5; na++ 140; ca 8.6 03-20-19: wbc 6.0; hgb 11.7; hct 36.6; mcv 90.6; plt 177; glucose 120; bun 13; creat 0.61; k+ 3.7; na ++ 139; ca 8.6  03-21-19: wbc 4.8; hgb 11.0; hct 34.4; mcv 91.5 plt 172;  03-28-19: wbc 5.2; hgb 11.8 hct 35. 9 mcv 90.0 plt 341; glucose 104; bun 30; creat 0.92; k+ 4.1; na++ 136; ca 9.3   NO NEW LABS.   Review of Systems  Reason unable to perform ROS: poor  historian.  Constitutional: Negative for malaise/fatigue.  Respiratory: Negative for cough.   Cardiovascular: Negative for chest pain.  Gastrointestinal: Negative for abdominal pain.  Musculoskeletal: Positive for back pain. Negative for joint pain and myalgias.  Skin: Negative.  Psychiatric/Behavioral: The patient is not nervous/anxious.     Physical Exam Constitutional:      General: She is not in acute distress.    Appearance: She is well-developed. She is not diaphoretic.     Comments: thin  Neck:     Musculoskeletal: Neck supple.     Thyroid: No thyromegaly.  Cardiovascular:     Rate and Rhythm: Normal rate and regular rhythm.     Pulses: Normal pulses.     Heart sounds: Normal heart sounds.  Pulmonary:     Effort: Pulmonary effort is normal. No respiratory distress.     Breath sounds: Normal breath sounds.  Chest:     Comments: History of right mastectomy  Abdominal:     General: Bowel sounds are normal. There is no distension.     Palpations: Abdomen is soft.     Tenderness: There is no abdominal tenderness.  Musculoskeletal:     Right lower leg: No edema.     Left lower leg: No edema.     Comments: Is able to move all extremities Is status post right femoral neck fracture    Lymphadenopathy:     Cervical: No cervical adenopathy.  Skin:    General: Skin is warm and dry.     Comments: Right medical ankle stasis ulcer: no signs of infection present.  Left heel callus Right hip incision line without signs of infection present   Neurological:     Mental Status: She is alert. Mental status is at baseline.  Psychiatric:        Mood and Affect: Mood normal.     ASSESSMENT/ PLAN:  TODAY:   1. Venous ulcer of ankle right 2. Closed fracture of right hip with routine healing subsequent encounter 3. Chronic bilateral low back pain without sciatica  She will be returning back to her assisted living Will need home health at the assisted living  Will continue to  monitor her status.   MD is aware of resident's narcotic use and is in agreement with current plan of care. We will attempt to wean resident as apropriate   Ok Edwards NP [facility]  Contact [phone] Monday through Friday 8am- 5pm  After hours call [phone]

## 2019-04-07 DIAGNOSIS — L97319 Non-pressure chronic ulcer of right ankle with unspecified severity: Secondary | ICD-10-CM | POA: Insufficient documentation

## 2019-04-07 DIAGNOSIS — I83013 Varicose veins of right lower extremity with ulcer of ankle: Secondary | ICD-10-CM | POA: Insufficient documentation

## 2019-05-04 ENCOUNTER — Ambulatory Visit (INDEPENDENT_AMBULATORY_CARE_PROVIDER_SITE_OTHER): Payer: Medicare Other | Admitting: Orthopedic Surgery

## 2019-05-04 ENCOUNTER — Other Ambulatory Visit: Payer: Self-pay

## 2019-05-04 ENCOUNTER — Ambulatory Visit (INDEPENDENT_AMBULATORY_CARE_PROVIDER_SITE_OTHER): Payer: Medicare Other

## 2019-05-04 VITALS — BP 145/82 | HR 65 | Temp 97.1°F

## 2019-05-04 DIAGNOSIS — Z8781 Personal history of (healed) traumatic fracture: Secondary | ICD-10-CM

## 2019-05-04 NOTE — Patient Instructions (Signed)
Continue gait training and therapy   Xray in the office in 6 weeks

## 2019-05-04 NOTE — Progress Notes (Signed)
Postop visit  Fracture right hip femoral neck  Encounter Diagnosis  Name Primary?  . S/P right hip fracture with cannulated screw placement 03/18/19 Yes   She did complain of back pain  Her hip motion is excellent and she does not complain of pain with that  X-ray: Fixation 3 cannulated screws with washers  Postop day #47  X-ray in 6 weeks

## 2019-06-15 ENCOUNTER — Encounter: Payer: Self-pay | Admitting: Orthopedic Surgery

## 2019-06-15 ENCOUNTER — Other Ambulatory Visit: Payer: Self-pay

## 2019-06-15 ENCOUNTER — Ambulatory Visit (INDEPENDENT_AMBULATORY_CARE_PROVIDER_SITE_OTHER): Payer: Medicare Other | Admitting: Orthopedic Surgery

## 2019-06-15 ENCOUNTER — Ambulatory Visit: Payer: Medicare Other

## 2019-06-15 VITALS — Temp 98.1°F

## 2019-06-15 DIAGNOSIS — I10 Essential (primary) hypertension: Secondary | ICD-10-CM | POA: Insufficient documentation

## 2019-06-15 DIAGNOSIS — M5136 Other intervertebral disc degeneration, lumbar region: Secondary | ICD-10-CM | POA: Insufficient documentation

## 2019-06-15 DIAGNOSIS — Z8781 Personal history of (healed) traumatic fracture: Secondary | ICD-10-CM | POA: Diagnosis not present

## 2019-06-15 DIAGNOSIS — S72001D Fracture of unspecified part of neck of right femur, subsequent encounter for closed fracture with routine healing: Secondary | ICD-10-CM

## 2019-06-15 DIAGNOSIS — Z87440 Personal history of urinary (tract) infections: Secondary | ICD-10-CM | POA: Insufficient documentation

## 2019-06-15 DIAGNOSIS — M48061 Spinal stenosis, lumbar region without neurogenic claudication: Secondary | ICD-10-CM | POA: Insufficient documentation

## 2019-06-15 DIAGNOSIS — M1990 Unspecified osteoarthritis, unspecified site: Secondary | ICD-10-CM | POA: Insufficient documentation

## 2019-06-15 DIAGNOSIS — M4306 Spondylolysis, lumbar region: Secondary | ICD-10-CM | POA: Insufficient documentation

## 2019-06-15 DIAGNOSIS — M51369 Other intervertebral disc degeneration, lumbar region without mention of lumbar back pain or lower extremity pain: Secondary | ICD-10-CM | POA: Insufficient documentation

## 2019-06-15 NOTE — Progress Notes (Signed)
Postop appointment at 3 months status post internal fixation right hip with cannulated screws right femoral neck fracture  Patient is doing well with her hip fracture she does complain of chronic back pain which is increased today.  She says she has been walking  Her exam shows no leg length abnormalities no alignment or rotational abnormalities  Her hip motion is normal  X-ray looks good  Follow-up in 9 months to x-ray the hip to rule out any hardware complications avascular necrosis that may develop  Encounter Diagnosis  Name Primary?  . S/P right hip fracture with cannulated screw placement 03/18/19 Yes

## 2019-06-24 ENCOUNTER — Ambulatory Visit (INDEPENDENT_AMBULATORY_CARE_PROVIDER_SITE_OTHER): Payer: Medicare Other | Admitting: Physician Assistant

## 2019-06-24 ENCOUNTER — Encounter: Payer: Self-pay | Admitting: Physician Assistant

## 2019-06-24 ENCOUNTER — Other Ambulatory Visit: Payer: Self-pay

## 2019-06-24 VITALS — BP 157/87 | HR 80 | Temp 98.4°F | Ht 71.0 in | Wt 124.2 lb

## 2019-06-24 DIAGNOSIS — M5136 Other intervertebral disc degeneration, lumbar region: Secondary | ICD-10-CM | POA: Diagnosis not present

## 2019-06-24 DIAGNOSIS — K219 Gastro-esophageal reflux disease without esophagitis: Secondary | ICD-10-CM

## 2019-06-24 DIAGNOSIS — D510 Vitamin B12 deficiency anemia due to intrinsic factor deficiency: Secondary | ICD-10-CM

## 2019-06-24 DIAGNOSIS — L97319 Non-pressure chronic ulcer of right ankle with unspecified severity: Secondary | ICD-10-CM

## 2019-06-24 DIAGNOSIS — M4306 Spondylolysis, lumbar region: Secondary | ICD-10-CM

## 2019-06-24 DIAGNOSIS — S72001D Fracture of unspecified part of neck of right femur, subsequent encounter for closed fracture with routine healing: Secondary | ICD-10-CM

## 2019-06-24 DIAGNOSIS — I83013 Varicose veins of right lower extremity with ulcer of ankle: Secondary | ICD-10-CM

## 2019-06-24 DIAGNOSIS — I10 Essential (primary) hypertension: Secondary | ICD-10-CM | POA: Diagnosis not present

## 2019-06-24 DIAGNOSIS — S22000D Wedge compression fracture of unspecified thoracic vertebra, subsequent encounter for fracture with routine healing: Secondary | ICD-10-CM

## 2019-06-24 DIAGNOSIS — F1190 Opioid use, unspecified, uncomplicated: Secondary | ICD-10-CM

## 2019-06-24 MED ORDER — OXYCONTIN 20 MG PO T12A: 20.0000 mg | EXTENDED_RELEASE_TABLET | Freq: Two times a day (BID) | ORAL | 0 refills | Status: DC

## 2019-06-27 ENCOUNTER — Telehealth: Payer: Self-pay | Admitting: Physician Assistant

## 2019-06-27 NOTE — Telephone Encounter (Signed)
Patient is complaining with her middle/lower back hurting. Patient fell Friday evening after she missed the bed. She fell in the floor.

## 2019-06-27 NOTE — Telephone Encounter (Signed)
Can you get more detail than what has been given?

## 2019-06-27 NOTE — Telephone Encounter (Signed)
Betty Carpenter at [facility] aware of recommendation.

## 2019-06-27 NOTE — Telephone Encounter (Signed)
Rest, ice and heat . If not feeling better can come to be seen by urgent provider on Tuesday or Thursday when radiology is here.  Or if there is a lot of pain she can go on to urgent care or ED

## 2019-06-28 ENCOUNTER — Ambulatory Visit (INDEPENDENT_AMBULATORY_CARE_PROVIDER_SITE_OTHER): Payer: Medicare Other | Admitting: Family Medicine

## 2019-06-28 ENCOUNTER — Encounter: Payer: Self-pay | Admitting: Family Medicine

## 2019-06-28 ENCOUNTER — Encounter: Payer: Self-pay | Admitting: Physician Assistant

## 2019-06-28 ENCOUNTER — Inpatient Hospital Stay (HOSPITAL_COMMUNITY): Payer: Medicare Other

## 2019-06-28 ENCOUNTER — Emergency Department (HOSPITAL_COMMUNITY): Payer: Medicare Other

## 2019-06-28 ENCOUNTER — Encounter (HOSPITAL_COMMUNITY): Payer: Self-pay | Admitting: Emergency Medicine

## 2019-06-28 ENCOUNTER — Inpatient Hospital Stay (HOSPITAL_COMMUNITY)
Admission: EM | Admit: 2019-06-28 | Discharge: 2019-07-11 | DRG: 470 | Disposition: A | Discharge status: Skilled Nursing Facility | Payer: Medicare Other | Attending: Internal Medicine | Admitting: Internal Medicine

## 2019-06-28 ENCOUNTER — Ambulatory Visit (INDEPENDENT_AMBULATORY_CARE_PROVIDER_SITE_OTHER): Payer: Medicare Other

## 2019-06-28 ENCOUNTER — Other Ambulatory Visit: Payer: Self-pay

## 2019-06-28 VITALS — BP 129/84 | HR 64 | Temp 97.0°F | Ht 71.0 in | Wt 124.0 lb

## 2019-06-28 DIAGNOSIS — Z96649 Presence of unspecified artificial hip joint: Secondary | ICD-10-CM

## 2019-06-28 DIAGNOSIS — F419 Anxiety disorder, unspecified: Secondary | ICD-10-CM | POA: Diagnosis present

## 2019-06-28 DIAGNOSIS — M545 Low back pain, unspecified: Secondary | ICD-10-CM

## 2019-06-28 DIAGNOSIS — S72002A Fracture of unspecified part of neck of left femur, initial encounter for closed fracture: Secondary | ICD-10-CM | POA: Diagnosis present

## 2019-06-28 DIAGNOSIS — M549 Dorsalgia, unspecified: Secondary | ICD-10-CM

## 2019-06-28 DIAGNOSIS — I10 Essential (primary) hypertension: Secondary | ICD-10-CM | POA: Diagnosis present

## 2019-06-28 DIAGNOSIS — F0390 Unspecified dementia without behavioral disturbance: Secondary | ICD-10-CM | POA: Diagnosis present

## 2019-06-28 DIAGNOSIS — Z993 Dependence on wheelchair: Secondary | ICD-10-CM

## 2019-06-28 DIAGNOSIS — Z20828 Contact with and (suspected) exposure to other viral communicable diseases: Secondary | ICD-10-CM | POA: Diagnosis present

## 2019-06-28 DIAGNOSIS — S22080A Wedge compression fracture of T11-T12 vertebra, initial encounter for closed fracture: Secondary | ICD-10-CM

## 2019-06-28 DIAGNOSIS — W19XXXA Unspecified fall, initial encounter: Secondary | ICD-10-CM

## 2019-06-28 DIAGNOSIS — Z79891 Long term (current) use of opiate analgesic: Secondary | ICD-10-CM | POA: Diagnosis not present

## 2019-06-28 DIAGNOSIS — Y92009 Unspecified place in unspecified non-institutional (private) residence as the place of occurrence of the external cause: Secondary | ICD-10-CM

## 2019-06-28 DIAGNOSIS — E440 Moderate protein-calorie malnutrition: Secondary | ICD-10-CM | POA: Diagnosis present

## 2019-06-28 DIAGNOSIS — M25552 Pain in left hip: Secondary | ICD-10-CM | POA: Diagnosis present

## 2019-06-28 DIAGNOSIS — G8929 Other chronic pain: Secondary | ICD-10-CM | POA: Diagnosis present

## 2019-06-28 DIAGNOSIS — Z79899 Other long term (current) drug therapy: Secondary | ICD-10-CM

## 2019-06-28 DIAGNOSIS — D62 Acute posthemorrhagic anemia: Secondary | ICD-10-CM | POA: Diagnosis not present

## 2019-06-28 DIAGNOSIS — I361 Nonrheumatic tricuspid (valve) insufficiency: Secondary | ICD-10-CM | POA: Diagnosis not present

## 2019-06-28 DIAGNOSIS — S72092A Other fracture of head and neck of left femur, initial encounter for closed fracture: Principal | ICD-10-CM | POA: Diagnosis present

## 2019-06-28 DIAGNOSIS — Z853 Personal history of malignant neoplasm of breast: Secondary | ICD-10-CM | POA: Diagnosis not present

## 2019-06-28 DIAGNOSIS — I080 Rheumatic disorders of both mitral and aortic valves: Secondary | ICD-10-CM | POA: Diagnosis present

## 2019-06-28 DIAGNOSIS — I4891 Unspecified atrial fibrillation: Secondary | ICD-10-CM | POA: Diagnosis present

## 2019-06-28 DIAGNOSIS — Z01810 Encounter for preprocedural cardiovascular examination: Secondary | ICD-10-CM | POA: Diagnosis not present

## 2019-06-28 DIAGNOSIS — E860 Dehydration: Secondary | ICD-10-CM | POA: Diagnosis present

## 2019-06-28 DIAGNOSIS — M5136 Other intervertebral disc degeneration, lumbar region: Secondary | ICD-10-CM

## 2019-06-28 DIAGNOSIS — Z9011 Acquired absence of right breast and nipple: Secondary | ICD-10-CM | POA: Diagnosis not present

## 2019-06-28 DIAGNOSIS — K219 Gastro-esophageal reflux disease without esophagitis: Secondary | ICD-10-CM | POA: Diagnosis present

## 2019-06-28 DIAGNOSIS — W1830XA Fall on same level, unspecified, initial encounter: Secondary | ICD-10-CM | POA: Diagnosis present

## 2019-06-28 DIAGNOSIS — Z681 Body mass index (BMI) 19 or less, adult: Secondary | ICD-10-CM

## 2019-06-28 DIAGNOSIS — R0902 Hypoxemia: Secondary | ICD-10-CM | POA: Diagnosis present

## 2019-06-28 DIAGNOSIS — L8992 Pressure ulcer of unspecified site, stage 2: Secondary | ICD-10-CM | POA: Diagnosis present

## 2019-06-28 DIAGNOSIS — K5903 Drug induced constipation: Secondary | ICD-10-CM | POA: Diagnosis not present

## 2019-06-28 DIAGNOSIS — E785 Hyperlipidemia, unspecified: Secondary | ICD-10-CM | POA: Diagnosis present

## 2019-06-28 DIAGNOSIS — Z86718 Personal history of other venous thrombosis and embolism: Secondary | ICD-10-CM

## 2019-06-28 DIAGNOSIS — I340 Nonrheumatic mitral (valve) insufficiency: Secondary | ICD-10-CM | POA: Diagnosis not present

## 2019-06-28 DIAGNOSIS — T148XXA Other injury of unspecified body region, initial encounter: Secondary | ICD-10-CM

## 2019-06-28 DIAGNOSIS — E559 Vitamin D deficiency, unspecified: Secondary | ICD-10-CM | POA: Diagnosis present

## 2019-06-28 DIAGNOSIS — R109 Unspecified abdominal pain: Secondary | ICD-10-CM

## 2019-06-28 DIAGNOSIS — M25551 Pain in right hip: Secondary | ICD-10-CM

## 2019-06-28 DIAGNOSIS — T402X5A Adverse effect of other opioids, initial encounter: Secondary | ICD-10-CM | POA: Diagnosis not present

## 2019-06-28 LAB — CBC WITH DIFFERENTIAL/PLATELET
Abs Immature Granulocytes: 0.08 10*3/uL — ABNORMAL HIGH (ref 0.00–0.07)
Basophils Absolute: 0 10*3/uL (ref 0.0–0.1)
Basophils Relative: 0 %
Eosinophils Absolute: 0 10*3/uL (ref 0.0–0.5)
Eosinophils Relative: 0 %
HCT: 41.3 % (ref 36.0–46.0)
Hemoglobin: 13 g/dL (ref 12.0–15.0)
Immature Granulocytes: 1 %
Lymphocytes Relative: 6 %
Lymphs Abs: 0.7 10*3/uL (ref 0.7–4.0)
MCH: 29 pg (ref 26.0–34.0)
MCHC: 31.5 g/dL (ref 30.0–36.0)
MCV: 92 fL (ref 80.0–100.0)
Monocytes Absolute: 0.9 10*3/uL (ref 0.1–1.0)
Monocytes Relative: 7 %
Neutro Abs: 10.2 10*3/uL — ABNORMAL HIGH (ref 1.7–7.7)
Neutrophils Relative %: 86 %
Platelets: 341 10*3/uL (ref 150–400)
RBC: 4.49 MIL/uL (ref 3.87–5.11)
RDW: 14.4 % (ref 11.5–15.5)
WBC: 11.8 10*3/uL — ABNORMAL HIGH (ref 4.0–10.5)
nRBC: 0 % (ref 0.0–0.2)

## 2019-06-28 LAB — URINALYSIS, ROUTINE W REFLEX MICROSCOPIC
Bilirubin Urine: NEGATIVE
Glucose, UA: NEGATIVE mg/dL
Ketones, ur: 5 mg/dL — AB
Leukocytes,Ua: NEGATIVE
Nitrite: NEGATIVE
Protein, ur: 30 mg/dL — AB
Specific Gravity, Urine: 1.026 (ref 1.005–1.030)
pH: 5 (ref 5.0–8.0)

## 2019-06-28 LAB — BASIC METABOLIC PANEL
Anion gap: 13 (ref 5–15)
BUN: 59 mg/dL — ABNORMAL HIGH (ref 8–23)
CO2: 24 mmol/L (ref 22–32)
Calcium: 9.4 mg/dL (ref 8.9–10.3)
Chloride: 102 mmol/L (ref 98–111)
Creatinine, Ser: 1.03 mg/dL — ABNORMAL HIGH (ref 0.44–1.00)
GFR calc Af Amer: 55 mL/min — ABNORMAL LOW (ref >60)
GFR calc non Af Amer: 47 mL/min — ABNORMAL LOW (ref >60)
Glucose, Bld: 124 mg/dL — ABNORMAL HIGH (ref 70–99)
Potassium: 3.6 mmol/L (ref 3.5–5.1)
Sodium: 139 mmol/L (ref 135–145)

## 2019-06-28 LAB — TSH: TSH: 1.23 u[IU]/mL (ref 0.350–4.500)

## 2019-06-28 LAB — TOXASSURE SELECT 13 (MW), URINE

## 2019-06-28 LAB — SARS CORONAVIRUS 2 BY RT PCR (HOSPITAL ORDER, PERFORMED IN ~~LOC~~ HOSPITAL LAB): SARS Coronavirus 2: NEGATIVE

## 2019-06-28 LAB — MAGNESIUM: Magnesium: 2.3 mg/dL (ref 1.7–2.4)

## 2019-06-28 LAB — TROPONIN I (HIGH SENSITIVITY)
Troponin I (High Sensitivity): 16 ng/L (ref <18)
Troponin I (High Sensitivity): 16 ng/L (ref <18)

## 2019-06-28 MED ORDER — SODIUM CHLORIDE 0.9 % IV SOLN: INTRAVENOUS | Status: DC

## 2019-06-28 MED ORDER — METOPROLOL TARTRATE 5 MG/5ML IV SOLN
5.0000 mg | INTRAVENOUS | Status: AC | PRN
  Administered 2019-06-29 – 2019-06-30 (×2): 5 mg via INTRAVENOUS
  Filled 2019-06-28 (×2): qty 5

## 2019-06-28 MED ORDER — DILTIAZEM HCL 25 MG/5ML IV SOLN
10.0000 mg | Freq: Once | INTRAVENOUS | Status: AC
  Administered 2019-06-28: 10 mg via INTRAVENOUS

## 2019-06-28 MED ORDER — PANTOPRAZOLE SODIUM 20 MG PO TBEC
20.0000 mg | DELAYED_RELEASE_TABLET | Freq: Every day | ORAL | Status: DC
  Administered 2019-06-29 – 2019-07-11 (×13): 20 mg via ORAL
  Filled 2019-06-28 (×14): qty 1

## 2019-06-28 MED ORDER — POTASSIUM CHLORIDE CRYS ER 20 MEQ PO TBCR
40.0000 meq | EXTENDED_RELEASE_TABLET | Freq: Once | ORAL | Status: AC
  Administered 2019-06-28: 40 meq via ORAL
  Filled 2019-06-28: qty 2

## 2019-06-28 MED ORDER — DOCUSATE SODIUM 100 MG PO CAPS
100.0000 mg | ORAL_CAPSULE | Freq: Two times a day (BID) | ORAL | Status: DC
  Administered 2019-06-29 – 2019-07-07 (×15): 100 mg via ORAL
  Filled 2019-06-28 (×18): qty 1

## 2019-06-28 MED ORDER — MORPHINE SULFATE (PF) 2 MG/ML IV SOLN
2.0000 mg | INTRAVENOUS | Status: DC | PRN
  Administered 2019-06-28 – 2019-06-29 (×4): 2 mg via INTRAVENOUS
  Filled 2019-06-28 (×4): qty 1

## 2019-06-28 MED ORDER — ONDANSETRON HCL 4 MG PO TABS: 4.0000 mg | ORAL_TABLET | Freq: Four times a day (QID) | ORAL | Status: DC | PRN

## 2019-06-28 MED ORDER — DILTIAZEM HCL 100 MG IV SOLR
5.0000 mg/h | INTRAVENOUS | Status: DC
  Administered 2019-06-28: 15 mg/h via INTRAVENOUS
  Administered 2019-06-28: 16:00:00 5 mg/h via INTRAVENOUS
  Filled 2019-06-28 (×3): qty 100

## 2019-06-28 MED ORDER — ONDANSETRON HCL 4 MG/2ML IJ SOLN: 4.0000 mg | Freq: Four times a day (QID) | INTRAMUSCULAR | Status: DC | PRN

## 2019-06-28 MED ORDER — POLYETHYLENE GLYCOL 3350 17 G PO PACK: 17.0000 g | PACK | Freq: Every day | ORAL | Status: DC | PRN

## 2019-06-28 MED ORDER — LUBIPROSTONE 24 MCG PO CAPS
24.0000 ug | ORAL_CAPSULE | Freq: Two times a day (BID) | ORAL | Status: DC
  Administered 2019-06-29 – 2019-07-11 (×25): 24 ug via ORAL
  Filled 2019-06-28 (×25): qty 1

## 2019-06-28 MED ORDER — METOPROLOL TARTRATE 50 MG PO TABS
50.0000 mg | ORAL_TABLET | Freq: Two times a day (BID) | ORAL | Status: DC
  Administered 2019-06-28 – 2019-06-29 (×2): 50 mg via ORAL
  Filled 2019-06-28 (×2): qty 1

## 2019-06-28 MED ORDER — DILTIAZEM HCL 25 MG/5ML IV SOLN
10.0000 mg | Freq: Once | INTRAVENOUS | Status: AC
  Administered 2019-06-28 (×2): 10 mg via INTRAVENOUS

## 2019-06-28 MED ORDER — ACETAMINOPHEN 650 MG RE SUPP: 650.0000 mg | Freq: Four times a day (QID) | RECTAL | Status: DC | PRN

## 2019-06-28 MED ORDER — SODIUM CHLORIDE 0.9 % IV SOLN
INTRAVENOUS | Status: AC
  Administered 2019-06-28: 23:00:00 via INTRAVENOUS

## 2019-06-28 MED ORDER — ACETAMINOPHEN 325 MG PO TABS
650.0000 mg | ORAL_TABLET | Freq: Four times a day (QID) | ORAL | Status: DC | PRN
  Administered 2019-06-29 – 2019-07-10 (×3): 650 mg via ORAL
  Filled 2019-06-28 (×4): qty 2

## 2019-06-28 MED ORDER — METOPROLOL TARTRATE 25 MG PO TABS
25.0000 mg | ORAL_TABLET | Freq: Two times a day (BID) | ORAL | Status: DC
  Administered 2019-06-29: 25 mg via ORAL
  Filled 2019-06-28: qty 1

## 2019-06-28 MED ORDER — FENTANYL CITRATE (PF) 100 MCG/2ML IJ SOLN
25.0000 ug | INTRAMUSCULAR | Status: DC | PRN
  Administered 2019-06-28 (×3): 25 ug via INTRAVENOUS
  Filled 2019-06-28 (×3): qty 2

## 2019-06-28 MED ORDER — SODIUM CHLORIDE 0.9 % IV SOLN
INTRAVENOUS | Status: DC
  Administered 2019-06-28: 15:00:00 via INTRAVENOUS

## 2019-06-28 NOTE — Progress Notes (Signed)
I have spoken to EDP about patient's left hip fracture.  I have requested transfer to hospitalist service at [facility] for surgery tomorrow afternoon.  Hold lovenox for impending surgery.  NPO after midnight.  I will see patient in morning for full consult.

## 2019-06-28 NOTE — Progress Notes (Signed)
Chief Complaint  Patient presents with  . Fall    fell down on Friday - onto back - back pain getting worse.    HPI  Patient presents today for low back pain. Onset after falling backward onto her lower back on 8/7. She ambulates in her room at [facility], but uses a wheelchair outside her room. She got herself up and into wheelchair. Has been unable to ambulate since. Pain at sacrum per pt history. Impact on lower back.  PMH: Smoking status noted ROS: Per HPI  Objective: BP 129/84   Pulse 64   Temp (!) 97 F (36.1 C) (Oral)   Ht 5\' 11"  (1.803 m)   Wt 124 lb (56.2 kg)   BMI 17.29 kg/m  Gen: Moderate distress due to pain, alert, cooperative with exam HEENT: NCAT, EOMI, PERRL Resp: CTABL, no wheezes, non-labored Abd: SNTND, BS present, no guarding or organomegaly Ext: left hip foreshortened noted on XR table. Tender at midline sacrum for deep palpation and at left hip. Neuro: Alert and oriented, No gross deficits  Hip and LS spine XR Preliminary reading done by Randell Loop: Fx at neck of left femur. Severe lumbosacral spondylosis. R hip noted to have surgical hardware in place for ORIF.    Assessment and plan:  1. Closed fracture of left hip, initial encounter ([place])   2. Fall, initial encounter   3. Low back pain, unspecified back pain laterality, unspecified chronicity, unspecified whether sciatica present     No orders of the defined types were placed in this encounter.   Orders Placed This Encounter  Procedures  . DG Lumbar Spine 2-3 Views    Standing Status:   Future    Number of Occurrences:   1    Standing Expiration Date:   08/27/2020    Order Specific Question:   Reason for Exam (SYMPTOM  OR DIAGNOSIS REQUIRED)    Answer:   fall - low back pain    Order Specific Question:   Preferred imaging location?    Answer:   Internal  . DG HIP UNILAT W OR W/O PELVIS 2-3 VIEWS LEFT    Standing Status:   Future    Number of Occurrences:   1    Standing Expiration  Date:   08/26/2020    Order Specific Question:   Reason for Exam (SYMPTOM  OR DIAGNOSIS REQUIRED)    Answer:   fall - low back pain    Order Specific Question:   Preferred imaging location?    Answer:   Internal  . DG HIP UNILAT W OR W/O PELVIS 2-3 VIEWS RIGHT    Standing Status:   Future    Number of Occurrences:   1    Standing Expiration Date:   08/26/2020    Order Specific Question:   Reason for Exam (SYMPTOM  OR DIAGNOSIS REQUIRED)    Answer:   fall - low back pain    Order Specific Question:   Preferred imaging location?    Answer:   Internal  Pt. Transferred by EMS to [facility] Emergency department.  Follow up as needed.  Claretta Fraise, MD

## 2019-06-28 NOTE — Progress Notes (Signed)
BP (!) 157/87   Pulse 80   Temp 98.4 F (36.9 C) (Temporal)   Ht 5\' 11"  (1.803 m)   Wt 124 lb 3.2 oz (56.3 kg)   BMI 17.32 kg/m    Subjective:    Patient ID: Betty Carpenter, female    DOB: [birth year], 85 y.o.   MRN: [id number]  HPI: Betty Carpenter is a 85 y.o. female presenting on 06/24/2019 for New Patient (Initial Visit)  This patient is a resident of [facility].  She is attended by a nurse today.  We have reviewed all of the papers that are given to [place] from them.  We have also reviewed Betty Carpenter information in epic.  She still goes once a month to a wound care specialist in [place].  She does have a known chronic stasis ulcer on the ankle.  The patient does have longstanding chronic pain related to degenerative disc disease, spondylosis, multiple fractures.  In reviewing her chart this is exactly what Betty Carpenter had been doing.  We will go ahead and restart her on those medications.  We will also continue her with all of her other medications.   PAIN ASSESSMENT: Cause of pain- DDD, lumbar spondylosis History of compression fracture and hip fracture  This patient returns for a 3 month recheck on narcotic use for the above named conditions  Current medications-OxyContin 20 mg 1 twice daily Medication side effects-none Any concerns-no  Pain on scale of 1-10-8 Frequency-Daily What increases pain-walking What makes pain Better-rest Effects on ADL -no Any change in general medical condition-no  Effectiveness of current meds-good Adverse reactions form pain meds-no PMP AWARE website reviewed: Yes Any suspicious activity on PMP Aware: No MME daily dose: 60 MME  Contract on file 06/24/19 Last UDS  06/24/19 History of overdose or risk of abuse no   Past Medical History:  Diagnosis Date  . Arthritis   . Chronic back pain   . Closed wedge compression fracture of L1 vertebra ([place]) 2004  . Hypertension   . Sciatica    Relevant past medical, surgical,  family and social history reviewed and updated as indicated. Interim medical history since our last visit reviewed. Allergies and medications reviewed and updated. DATA REVIEWED: CHART IN EPIC  Family History reviewed for pertinent findings.  Review of Systems  Constitutional: Positive for fatigue. Negative for activity change, appetite change, fever and unexpected weight change.  HENT: Negative.   Eyes: Negative.   Respiratory: Negative.   Cardiovascular: Positive for leg swelling. Negative for chest pain.  Gastrointestinal: Negative.   Genitourinary: Negative.   Musculoskeletal: Positive for arthralgias and back pain.  Psychiatric/Behavioral: Negative.     Allergies as of 06/24/2019      Reactions   Cymbalta [duloxetine Hcl]    " couldn't make water"- unable to urinate      Medication List       Accurate as of June 24, 2019 11:59 PM. If you have any questions, ask your nurse or doctor.        STOP taking these medications   rivaroxaban 10 MG Tabs tablet Commonly known as: XARELTO Stopped by: Betty Sleeper, PA-C   tiZANidine 4 MG tablet Commonly known as: ZANAFLEX Stopped by: Betty Sleeper, PA-C     TAKE these medications   acetaminophen 500 MG tablet Commonly known as: TYLENOL Take 1,000 mg by mouth every 8 (eight) hours.   amLODipine 5 MG tablet Commonly known as: NORVASC Take 1 tablet (5  mg total) by mouth daily.   celecoxib 200 MG capsule Commonly known as: CELEBREX Take 1 capsule (200 mg total) by mouth 2 (two) times daily.   Docusate Sodium 100 MG capsule Take 100 mg by mouth 2 (two) times daily.   feeding supplement (ENSURE ENLIVE) Liqd Take 237 mLs by mouth 2 (two) times daily between meals.   lubiprostone 24 MCG capsule Commonly known as: AMITIZA Take 24 mcg by mouth 2 (two) times daily.   metoprolol tartrate 25 MG tablet Commonly known as: LOPRESSOR Take 1 tablet (25 mg total) by mouth 2 (two) times daily.   multivitamin with minerals  tablet Take 1 tablet by mouth daily.   NON FORMULARY Diet Type:   NAS   ondansetron 4 MG disintegrating tablet Commonly known as: ZOFRAN-ODT Take 4 mg by mouth every 6 (six) hours as needed for nausea or vomiting.   OxyCONTIN 20 mg 12 hr tablet Generic drug: oxyCODONE Take 1 tablet (20 mg total) by mouth every 12 (twelve) hours. What changed:   how much to take  how to take this  when to take this Changed by: Betty Sleeper, PA-C   pantoprazole 20 MG tablet Commonly known as: Protonix Take 1 tablet (20 mg total) by mouth daily.   Santyl ointment Generic drug: collagenase Apply 1 application topically daily.   UNABLE TO FIND Wound Care -   Clean wound right malleolar with N/S, apply santyl ointment and cover with allevyn foam dressing and change daily and as needed   vitamin A & D ointment Apply topically to callous left medial heel every shift and as needed   Vitamin D (Ergocalciferol) 1.25 MG (50000 UT) Caps capsule Commonly known as: DRISDOL Take 50,000 Units by mouth See admin instructions. Twice a week on Tues, Saturday          Objective:    BP (!) 157/87   Pulse 80   Temp 98.4 F (36.9 C) (Temporal)   Ht 5\' 11"  (1.803 m)   Wt 124 lb 3.2 oz (56.3 kg)   BMI 17.32 kg/m   Allergies  Allergen Reactions  . Cymbalta [Duloxetine Hcl]     " couldn't make water"- unable to urinate    Wt Readings from Last 3 Encounters:  06/28/19 124 lb (56.2 kg)  06/24/19 124 lb 3.2 oz (56.3 kg)  04/06/19 116 lb (52.6 kg)    Physical Exam Constitutional:      General: She is not in acute distress.    Appearance: Normal appearance. She is well-developed.     Comments: Wheelchair bound   HENT:     Head: Normocephalic and atraumatic.  Cardiovascular:     Rate and Rhythm: Normal rate.  Pulmonary:     Effort: Pulmonary effort is normal.  Skin:    General: Skin is warm and dry.     Findings: No rash.  Neurological:     Mental Status: She is alert and oriented to  person, place, and time.     Deep Tendon Reflexes: Reflexes are normal and symmetric.     Results for orders placed or performed during the hospital encounter of 04/04/19  Comprehensive metabolic panel  Result Value Ref Range   Sodium 139 135 - 145 mmol/L   Potassium 4.3 3.5 - 5.1 mmol/L   Chloride 104 98 - 111 mmol/L   CO2 25 22 - 32 mmol/L   Glucose, Bld 114 (H) 70 - 99 mg/dL   BUN 39 (H) 8 - 23 mg/dL  Creatinine, Ser 0.80 0.44 - 1.00 mg/dL   Calcium 9.4 8.9 - 10.3 mg/dL   Total Protein 6.2 (L) 6.5 - 8.1 g/dL   Albumin 3.7 3.5 - 5.0 g/dL   AST 19 15 - 41 U/L   ALT 14 0 - 44 U/L   Alkaline Phosphatase 89 38 - 126 U/L   Total Bilirubin 0.5 0.3 - 1.2 mg/dL   GFR calc non Af Amer >60 >60 mL/min   GFR calc Af Amer >60 >60 mL/min   Anion gap 10 5 - 15  CBC  Result Value Ref Range   WBC 5.7 4.0 - 10.5 K/uL   RBC 4.02 3.87 - 5.11 MIL/uL   Hemoglobin 11.5 (L) 12.0 - 15.0 g/dL   HCT 36.7 36.0 - 46.0 %   MCV 91.3 80.0 - 100.0 fL   MCH 28.6 26.0 - 34.0 pg   MCHC 31.3 30.0 - 36.0 g/dL   RDW 13.7 11.5 - 15.5 %   Platelets 325 150 - 400 K/uL   nRBC 0.0 0.0 - 0.2 %      Assessment & Plan:   1. DDD (degenerative disc disease), lumbar - OXYCONTIN 20 MG 12 hr tablet; Take 1 tablet (20 mg total) by mouth every 12 (twelve) hours.  Dispense: 60 tablet; Refill: 0 - ToxASSURE Select 13 (MW), Urine  2. Essential hypertension Lopressor 25 mg twice daily Amlodipine 5 mg 1 daily  3. GERD without esophagitis Pantoprazole 20 mg 1 daily  4. Venous ulcer of ankle, right ([place]) Continue wound care at nursing center  5. Lumbar spondylolysis Continue OxyContin 20 mg 1 pill twice daily  6. Closed fracture of right hip with routine healing, subsequent encounter Follow-up as needed  7. Closed compression fracture of thoracic vertebra with routine healing, subsequent encounter Follow-up as needed  8. Pernicious anemia  9. Chronic, continuous use of opioids Recheck in 3 months    Continue all other maintenance medications as listed above.  Follow up plan: Follow up 3 months  Educational handout given for [place] PA-C [facility] [address]  [place], [place] [address] [phone]   06/28/2019, 1:07 PM

## 2019-06-28 NOTE — H&P (Addendum)
History and Physical    Betty Carpenter EYC:[id number] DOB: [birth year] DOA: 06/28/2019  PCP: Betty Sleeper, PA-C   Patient coming from: [facility] Assisted living facility  I have personally briefly reviewed patient's old medical records in [place]  Chief Complaint: FALL  HPI: Betty Carpenter is a 85 y.o. female with medical history significant for breast cancer with right mastectomy, hypertension, who was brought to the ED with reports of a fall 4 days ago, 8/7.  Daughter was present at bedside.  Patient cannot remember details of the fall, but reports she fell backwards onto her buttock/lower back.  She denies hitting her head.  At baseline she ambulates with a wheelchair.  She got herself up onto her wheelchair, but then has not been able to transfer from bed to chair as she usually does.  She denies chest pain or difficulty breathing.  No history of irregular heart rhythm.  Patient saw her primary care provider today, hip x-ray revealed a left femoral neck fracture.  She was referred to the ED.  In route patient was very tachycardic, she was given 7 mg of morphine in route with O2 sats dropping to 80s.  She was subsequently placed on nonrebreather and Narcan was given.  Hospitalization 4/30- 03/22/19 -for right hip fracture, surgery done by Dr. Aline Carpenter at an [facility].    ED Course: Heart rate 160- 170s, blood pressure 110s-160s, O2 sats > 96% now on 2L nasal cannula. Unremarkable BMP,  WBC 11.8, normal TSH 1.2.  UA rare bacteria.  COVID-19 test negative.  Portable chest x-ray no acute abnormality.  Cardizem GTT started in ED, EDP talk to orthopedics on call Dr. Erlinda Hong who recommended transfer to [facility].   Review of Systems: As per HPI all other systems reviewed and negative.  Past Medical History:  Diagnosis Date  . Arthritis   . Chronic back pain   . Closed wedge compression fracture of L1 vertebra ([place]) 2004  . Hypertension   . Sciatica     Past Surgical  History:  Procedure Laterality Date  . APPENDECTOMY    . HIP PINNING,CANNULATED Right 03/18/2019   Procedure: CANNULATED HIP PINNING;  Surgeon: Betty Civil, MD;  Location: [facility];  Service: Orthopedics;  Laterality: Right;  11 am      reports that she has never smoked. She has never used smokeless tobacco. No history on file for alcohol and drug.  Allergies  Allergen Reactions  . Cymbalta [Duloxetine Hcl]     " couldn't make water"- unable to urinate    Family History  Problem Relation Age of Onset  . Cancer Maternal Grandfather     Prior to Admission medications   Medication Sig Start Date End Date Taking? Authorizing Provider  acetaminophen (TYLENOL) 325 MG tablet Take 650 mg by mouth every 4 (four) hours.   Yes [provider]  amLODipine (NORVASC) 5 MG tablet Take 1 tablet (5 mg total) by mouth daily. 03/23/19  Yes Barton Dubois, MD  celecoxib (CELEBREX) 200 MG capsule Take 1 capsule (200 mg total) by mouth 2 (two) times daily. 03/22/19  Yes Barton Dubois, MD  Docusate Sodium 100 MG capsule Take 100 mg by mouth 2 (two) times daily.   Yes [provider]  feeding supplement, ENSURE ENLIVE, (ENSURE ENLIVE) LIQD Take 237 mLs by mouth 2 (two) times daily between meals. 03/22/19  Yes Barton Dubois, MD  loperamide (IMODIUM A-D) 2 MG tablet Take 2 mg by mouth  4 (four) times daily as needed for diarrhea or loose stools.   Yes [provider]  lubiprostone (AMITIZA) 24 MCG capsule Take 24 mcg by mouth 2 (two) times daily.   Yes [provider]  magnesium hydroxide (MILK OF MAGNESIA) 400 MG/5ML suspension Take 30 mLs by mouth 2 (two) times daily as needed for mild constipation.   Yes [provider]  Menthol-Methyl Salicylate (SALONPAS PAIN RELIEF PATCH) [place] Apply 1 patch topically daily as needed.   Yes [provider]  metoprolol tartrate (LOPRESSOR) 25 MG tablet Take 1 tablet (25 mg total) by mouth 2 (two) times daily. 03/22/19  Yes  Barton Dubois, MD  Multiple Vitamins-Minerals (MULTIVITAMIN WITH MINERALS) tablet Take 1 tablet by mouth daily.   Yes [provider]  NON FORMULARY Diet Type:   NAS 03/22/19  Yes [provider]  ondansetron (ZOFRAN-ODT) 4 MG disintegrating tablet Take 4 mg by mouth every 6 (six) hours as needed for nausea or vomiting.  07/23/17  Yes [provider]  OXYCONTIN 20 MG 12 hr tablet Take 1 tablet (20 mg total) by mouth every 12 (twelve) hours. 06/24/19  Yes Betty Sleeper, PA-C  pantoprazole (PROTONIX) 20 MG tablet Take 1 tablet (20 mg total) by mouth daily. 03/22/19 03/21/20 Yes Barton Dubois, MD  tiZANidine (ZANAFLEX) 4 MG tablet Take 4 mg by mouth every 6 (six) hours as needed for muscle spasms.   Yes [provider]  UNABLE TO FIND Wound Care -   Clean wound right malleolar with N/S, apply santyl ointment and cover with allevyn foam dressing and change daily and as needed 03/22/19  Yes [provider]  Vitamin D, Ergocalciferol, (DRISDOL) 50000 units CAPS capsule Take 50,000 Units by mouth See admin instructions. Twice a week on Tues, Saturday 08/21/16  Yes [provider]    Physical Exam: Vitals:   06/28/19 1851 06/28/19 1900 06/28/19 1930 06/28/19 2000  BP: (!) 153/81 (!) 143/84 (!) 152/88 (!) 116/99  Pulse: (!) 107 (!) 125 (!) 116   Resp: (!) 21 (!) 22 20 (!) 27  Temp:      TempSrc:      SpO2: 100% 100% 100%   Weight:      Height:        Constitutional: NAD, calm, comfortable Vitals:   06/28/19 1851 06/28/19 1900 06/28/19 1930 06/28/19 2000  BP: (!) 153/81 (!) 143/84 (!) 152/88 (!) 116/99  Pulse: (!) 107 (!) 125 (!) 116   Resp: (!) 21 (!) 22 20 (!) 27  Temp:      TempSrc:      SpO2: 100% 100% 100%   Weight:      Height:       Eyes: PERRL, lids and conjunctivae normal ENMT: Mucous membranes are moist. Posterior pharynx clear of any exudate or lesions.  Neck: normal, supple, no masses, no thyromegaly Respiratory: clear to  auscultation bilaterally, no wheezing, no crackles. Normal respiratory effort. No accessory muscle use.  Cardiovascular: Tachycardic, irregular rate and rhythm, no murmurs / rubs / gallops. No extremity edema. 2+ pedal pulses.  Abdomen: no tenderness, no masses palpated. No hepatosplenomegaly. Bowel sounds positive.  Musculoskeletal: no clubbing / cyanosis. No joint deformity upper and lower extremities. Good ROM, no contractures. Normal muscle tone.  Skin: ~2.5 by 2.5 cm chronic wound to right lateral malleola area, with drainage, minimal surrounding erythema without tenderness or fluctuance, also healing wound with scab to left heal. Neurologic: CN 2-12 grossly intact.  Moving all extremities  spontaneously, left lower extremity not tested. Psychiatric: Normal judgment and insight. Alert and oriented x 3. Normal mood.   Labs on Admission: I have personally reviewed following labs and imaging studies  CBC: Recent Labs  Lab 06/28/19 1432  WBC 11.8*  NEUTROABS 10.2*  HGB 13.0  HCT 41.3  MCV 92.0  PLT 630   Basic Metabolic Panel: Recent Labs  Lab 06/28/19 1432  NA 139  K 3.6  CL 102  CO2 24  GLUCOSE 124*  BUN 59*  CREATININE 1.03*  CALCIUM 9.4   Thyroid Function Tests: Recent Labs    06/28/19 1432  TSH 1.230   Urine analysis:    Component Value Date/Time   COLORURINE YELLOW 06/28/2019 1516   APPEARANCEUR HAZY (A) 06/28/2019 1516   LABSPEC 1.026 06/28/2019 1516   PHURINE 5.0 06/28/2019 1516   GLUCOSEU NEGATIVE 06/28/2019 1516   HGBUR MODERATE (A) 06/28/2019 1516   BILIRUBINUR NEGATIVE 06/28/2019 1516   KETONESUR 5 (A) 06/28/2019 1516   PROTEINUR 30 (A) 06/28/2019 1516   NITRITE NEGATIVE 06/28/2019 1516   LEUKOCYTESUR NEGATIVE 06/28/2019 1516    Radiological Exams on Admission: Dg Lumbar Spine 2-3 Views  Result Date: 06/28/2019 CLINICAL DATA:  Recent fall with low back pain, initial encounter EXAM: LUMBAR SPINE - 2-3 VIEW COMPARISON:  CT dated 02/26/2018  FINDINGS: There are changes consistent with prior vertebral augmentation at T12. Underlying compression deformities at T11 and L1 are noted. The T11 fracture is new from the prior CT examination. The L1 fracture is chronic. Degenerative changes are noted at L3-4 somewhat progressed from the prior CT examination. No other acute abnormality is noted. Diffuse aortic calcifications are seen. IMPRESSION: Changes of prior vertebral augmentation at T12. Interval fracture at T11 when compared with the prior CT examination. This is of uncertain chronicity however. Electronically Signed   By: Inez Catalina M.D.   On: 06/28/2019 14:51   Ct Head Wo Contrast  Result Date: 06/28/2019 CLINICAL DATA:  Fall. EXAM: CT HEAD WITHOUT CONTRAST TECHNIQUE: Contiguous axial images were obtained from the base of the skull through the vertex without intravenous contrast. COMPARISON:  07/14/2016 FINDINGS: Brain: There is no evidence of acute infarct, intracranial hemorrhage, mass, midline shift, or extra-axial fluid collection. Generalized cerebral atrophy is not greater than expected for age. Patchy hypodensities involving the left greater than right cerebral hemispheric white matter have at most mildly progressed and are nonspecific but compatible with mild chronic small vessel ischemic disease. Vascular: Calcified atherosclerosis at the skull base. No hyperdense vessel. Skull: No fracture or focal osseous lesion. Sinuses/Orbits: Visualized paranasal sinuses and mastoid air cells are clear bilateral cataract extraction is noted. Other: None. IMPRESSION: 1. No evidence of acute intracranial abnormality. 2. Mild chronic small vessel ischemic disease. Electronically Signed   By: Logan Bores M.D.   On: 06/28/2019 18:29   Dg Chest Port 1 View  Result Date: 06/28/2019 CLINICAL DATA:  Known left hip fracture EXAM: PORTABLE CHEST 1 VIEW COMPARISON:  03/17/2019 FINDINGS: Cardiac shadow is stable. Patient is rotated significantly to the right  accentuating the mediastinal markings. Aortic calcifications are seen. No acute bony abnormality is noted. Lungs are clear bilaterally. IMPRESSION: No acute abnormality noted. Electronically Signed   By: Inez Catalina M.D.   On: 06/28/2019 14:49   Dg Hip Unilat W Or W/o Pelvis 2-3 Views Left  Result Date: 06/28/2019 CLINICAL DATA:  Hip pain after recent fall EXAM: DG HIP (WITH OR WITHOUT PELVIS) 2-3V LEFT; DG HIP (WITH OR WITHOUT PELVIS)  2-3V RIGHT COMPARISON:  06/15/2019 FINDINGS: There is an acute fracture of the left femoral neck, which appears predominantly subcapital. There is at least 2.5 cm of foreshortening and 1.5 cm of lateral displacement. Left femoroacetabular joint remains aligned without dislocation. Chronic deformity of the proximal right femur status post ORIF with intact hardware. The osseous structures are diffusely demineralized. No additional acute fractures are identified. SI joints and pubic symphysis are intact with mild degenerative changes. Advanced aortoiliac atherosclerotic calcifications. IMPRESSION: Acute, moderately displaced, foreshortened fracture of the left femoral neck. These results will be called to the ordering clinician or representative by the Radiologist Assistant, and communication documented in the PACS or zVision Dashboard. Electronically Signed   By: Davina Poke M.D.   On: 06/28/2019 11:46   Dg Hip Unilat W Or W/o Pelvis 2-3 Views Right  Result Date: 06/28/2019 CLINICAL DATA:  Hip pain after recent fall EXAM: DG HIP (WITH OR WITHOUT PELVIS) 2-3V LEFT; DG HIP (WITH OR WITHOUT PELVIS) 2-3V RIGHT COMPARISON:  06/15/2019 FINDINGS: There is an acute fracture of the left femoral neck, which appears predominantly subcapital. There is at least 2.5 cm of foreshortening and 1.5 cm of lateral displacement. Left femoroacetabular joint remains aligned without dislocation. Chronic deformity of the proximal right femur status post ORIF with intact hardware. The osseous  structures are diffusely demineralized. No additional acute fractures are identified. SI joints and pubic symphysis are intact with mild degenerative changes. Advanced aortoiliac atherosclerotic calcifications. IMPRESSION: Acute, moderately displaced, foreshortened fracture of the left femoral neck. These results will be called to the ordering clinician or representative by the Radiologist Assistant, and communication documented in the PACS or zVision Dashboard. Electronically Signed   By: Davina Poke M.D.   On: 06/28/2019 11:46    EKG: Independently reviewed.  Rates 175, read as supraventricular tachycardia.  QTc 482.  Assessment/Plan Active Problems:   Closed left hip fracture (HCC)   Left femoral neck fracture- sustained from fall.  Patient ambulates with wheelchair at baseline.  Unable to transfer since fall.  Patient sustained right femoral neck fracture and had subsequent surgery 4/30-03/22/19.  -N.p.o. midnight -Morphine 2 mg PRN -Transfer to [facility], follow-up orthopedic recommendations  New onset atrial fibrillation with RVR- rates up to 175.  In the setting of fall, with fracture and pain. ? Duration/onset.  No history of A. Fib.  No chest pain or dyspnea.  TSH within normal limits.  Potassium 3.6. Cardizem 10mg  bolus x 1 given in Ed, started on GTT. -Repeat Cardizem 10 mg bolus, continue Cardizem GTT, goal rate is <105. -Repeat EKG-rate 126, atrial fibrillation. -Check Hs troponins x 2 - 16, unremarkable -Check magnesium-normal at 2.3 -Will hold off on anticoagulation as patient is planned for surgery -Resume home metoprolol at increased dose 50 mg twice daily - IV metoprolol 5mg  PRN -Echocardiogram -Consult cardiology in a.m.  Hypoxia-O2 sats down to 80s in route, likely due to 7mg  morphine given.  No dyspnea.  Initially placed on nonrebreather currently on 2 L O2.  Portable chest x-ray without acute abnormality. -Monitor  Hypertension- stable.  Patient's metoprolol 25  mg twice daily, Norvasc 5 mg daily. -Resume home metoprolol at increased dose 50 mg twice daily -Hold home Norvasc to allow for adjustment of rate limiting medications.  Chronic right ankle wound- with drainage.  Mild surrounding erythema without tenderness or fluctuance.   -Wound care consult.  T11 fracture, lumbar spinal stenosis, lumbar spondylosis- ambulates with wheelchair.  On chronic pain medications -Follow Ortho recommendations, ?  If brace needed. -Hold home chronic narcotics for now   DVT prophylaxis: SCDs Code Status: Full code Family Communication: Daughter Enid Derry at bedside who is healthcare power of attorney. Disposition Plan: Per rounding team Consults called: Orthopedics.  Please consult cardiology in a.m. Admission status: Inpatient, stepdown I certify that at the point of admission it is my clinical judgment that the patient will require inpatient hospital care spanning beyond 2 midnights from the point of admission due to high intensity of service, high risk for further deterioration and high frequency of surveillance required. The following factors support the patient status of inpatient: Atrial fibrillation with RVR requiring IV Cardizem and plan stepdown level of care, and femoral fracture requiring surgery inpatient.   Bethena Roys MD [facility]  06/28/2019, 9:29 PM

## 2019-06-28 NOTE — ED Triage Notes (Addendum)
Pt sent over by [facility] for a LT sided hip fracture. Pt fell on Friday and is from [place]. Pt extremely tachycardic. Pt given 7 mg of morphine total in route. Pt's saturations dropped into the 80s. Pt placed on NRB and given .05mg  Narcan. Pt AO upon arrival. HR 164.

## 2019-06-28 NOTE — ED Provider Notes (Signed)
[facility] EMERGENCY DEPARTMENT Provider Note   CSN: [id number] Arrival date & time: 06/28/19  1303    History   Chief Complaint Chief Complaint  Patient presents with   Hip Injury    HPI Betty Carpenter is a 85 y.o. female.     Patient brought over from outpatient office for newly diagnosed left hip fracture.  Patient fell on Friday and reportedly fell on her back and had pain since then.  Patient was given morphine on route and dropped saturations 80s for which they gave 0.5 mg and Narcan.  Patient's heart rate also elevated in the office no history of known A. fib.  Patient has history of compression fractures.  Patient does not recall details of the fall on Friday.     Past Medical History:  Diagnosis Date   Arthritis    Chronic back pain    Closed wedge compression fracture of L1 vertebra ([place]) 2004   Hypertension    Sciatica     Patient Active Problem List   Diagnosis Date Noted   DDD (degenerative disc disease), lumbar 06/15/2019   HTN (hypertension) 06/15/2019   Hx: UTI (urinary tract infection) 06/15/2019   Lumbar spinal stenosis 06/15/2019   Lumbar spondylolysis 06/15/2019   Osteoarthrosis 06/15/2019   Venous ulcer of ankle, right ([place]) 04/07/2019   GERD without esophagitis 03/23/2019   Constipation due to opioid therapy 03/23/2019   Vitamin D deficiency 03/23/2019   S/P right hip fracture with cannulated screw placement 03/18/19    Anxiety    Pressure injury of skin 03/18/2019   Closed right hip fracture ([place]) 03/17/2019   Sinus tachycardia 03/17/2019   Arterial leg ulcer ([place]) 11/02/2018   Lymphedema 11/02/2018   Closed compression fracture of thoracic vertebra ([place]) 03/15/2018   History of breast cancer 10/23/2017   History of DVT (deep vein thrombosis) 10/23/2017   Chronic back pain 10/23/2017   Benign essential HTN 10/23/2017   Hypertensive urgency 10/23/2017   Chronic, continuous use of opioids 10/23/2017   Anemia  08/18/2016   Chronic fatigue 05/13/2016   Pernicious anemia 05/13/2016   Dyslipidemia 01/15/2016   History of right mastectomy 09/17/2015   Conjunctival hemorrhage, left eye 02/13/2015   Mixed incontinence 11/28/2014   Breast cancer, stage 1 ([place]) 01/28/2012    Past Surgical History:  Procedure Laterality Date   APPENDECTOMY     HIP PINNING,CANNULATED Right 03/18/2019   Procedure: CANNULATED HIP PINNING;  Surgeon: Carole Civil, MD;  Location: [facility];  Service: Orthopedics;  Laterality: Right;  11 am      OB History   No obstetric history on file.      Home Medications    Prior to Admission medications   Medication Sig Start Date End Date Taking? Authorizing Provider  acetaminophen (TYLENOL) 325 MG tablet Take 650 mg by mouth every 4 (four) hours.   Yes [provider]  amLODipine (NORVASC) 5 MG tablet Take 1 tablet (5 mg total) by mouth daily. 03/23/19  Yes Barton Dubois, MD  celecoxib (CELEBREX) 200 MG capsule Take 1 capsule (200 mg total) by mouth 2 (two) times daily. 03/22/19  Yes Barton Dubois, MD  Docusate Sodium 100 MG capsule Take 100 mg by mouth 2 (two) times daily.   Yes [provider]  feeding supplement, ENSURE ENLIVE, (ENSURE ENLIVE) LIQD Take 237 mLs by mouth 2 (two) times daily between meals. 03/22/19  Yes Barton Dubois, MD  loperamide (IMODIUM A-D) 2 MG tablet Take 2 mg by mouth  4 (four) times daily as needed for diarrhea or loose stools.   Yes [provider]  lubiprostone (AMITIZA) 24 MCG capsule Take 24 mcg by mouth 2 (two) times daily.   Yes [provider]  magnesium hydroxide (MILK OF MAGNESIA) 400 MG/5ML suspension Take 30 mLs by mouth 2 (two) times daily as needed for mild constipation.   Yes [provider]  Menthol-Methyl Salicylate (SALONPAS PAIN RELIEF PATCH) [place] Apply 1 patch topically daily as needed.   Yes [provider]  metoprolol tartrate (LOPRESSOR) 25 MG tablet Take 1 tablet (25  mg total) by mouth 2 (two) times daily. 03/22/19  Yes Barton Dubois, MD  Multiple Vitamins-Minerals (MULTIVITAMIN WITH MINERALS) tablet Take 1 tablet by mouth daily.   Yes [provider]  NON FORMULARY Diet Type:   NAS 03/22/19  Yes [provider]  ondansetron (ZOFRAN-ODT) 4 MG disintegrating tablet Take 4 mg by mouth every 6 (six) hours as needed for nausea or vomiting.  07/23/17  Yes [provider]  OXYCONTIN 20 MG 12 hr tablet Take 1 tablet (20 mg total) by mouth every 12 (twelve) hours. 06/24/19  Yes Terald Sleeper, PA-C  pantoprazole (PROTONIX) 20 MG tablet Take 1 tablet (20 mg total) by mouth daily. 03/22/19 03/21/20 Yes Barton Dubois, MD  tiZANidine (ZANAFLEX) 4 MG tablet Take 4 mg by mouth every 6 (six) hours as needed for muscle spasms.   Yes [provider]  UNABLE TO FIND Wound Care -   Clean wound right malleolar with N/S, apply santyl ointment and cover with allevyn foam dressing and change daily and as needed 03/22/19  Yes [provider]  Vitamin D, Ergocalciferol, (DRISDOL) 50000 units CAPS capsule Take 50,000 Units by mouth See admin instructions. Twice a week on Tues, Saturday 08/21/16  Yes [provider]    Family History Family History  Problem Relation Age of Onset   Cancer Maternal Grandfather     Social History Social History   Tobacco Use   Smoking status: Never Smoker   Smokeless tobacco: Never Used  Substance Use Topics   Alcohol use: Not on file   Drug use: Not on file     Allergies   Cymbalta [duloxetine hcl]   Review of Systems Review of Systems  Constitutional: Negative for chills and fever.  HENT: Negative for congestion.   Eyes: Negative for visual disturbance.  Respiratory: Negative for shortness of breath.   Cardiovascular: Negative for chest pain.  Gastrointestinal: Negative for abdominal pain and vomiting.  Genitourinary: Negative for dysuria and flank pain.  Musculoskeletal: Positive for  back pain and gait problem. Negative for neck pain and neck stiffness.  Skin: Negative for rash.  Neurological: Negative for light-headedness and headaches.     Physical Exam Updated Vital Signs BP (!) 145/105 (BP Location: Right Arm)    Pulse (!) 160    Temp 97.6 F (36.4 C) (Oral)    Resp 20    Ht 5\' 11"  (1.803 m)    Wt 56 kg    SpO2 97%    BMI 17.22 kg/m   Physical Exam Vitals signs and nursing note reviewed.  Constitutional:      Appearance: She is well-developed.  HENT:     Head: Normocephalic and atraumatic.  Eyes:     General:        Right eye: No discharge.        Left eye: No discharge.  Neck:     Musculoskeletal: Normal range  of motion and neck supple.     Trachea: No tracheal deviation.  Cardiovascular:     Rate and Rhythm: Tachycardia present. Rhythm irregular.     Heart sounds: Murmur present.  Pulmonary:     Effort: Pulmonary effort is normal.     Breath sounds: Normal breath sounds.  Abdominal:     General: There is no distension.     Palpations: Abdomen is soft.     Tenderness: There is no abdominal tenderness. There is no guarding.  Musculoskeletal:        General: Tenderness and signs of injury present.     Comments: Patient has no midline cervical or thoracic tenderness patient has midline and paraspinal lumbar tenderness.  Patient has tenderness and decreased range of motion the left hip she is lying on her right side.  Patient denies tenderness to knees or ankles.  Skin:    General: Skin is warm.     Findings: No rash.  Neurological:     Mental Status: She is alert and oriented to person, place, and time.     Cranial Nerves: No cranial nerve deficit.  Psychiatric:        Behavior: Behavior normal.      ED Treatments / Results  Labs (all labs ordered are listed, but only abnormal results are displayed) Labs Reviewed  BASIC METABOLIC PANEL - Abnormal; Notable for the following components:      Result Value   Glucose, Bld 124 (*)    BUN 59  (*)    Creatinine, Ser 1.03 (*)    GFR calc non Af Amer 47 (*)    GFR calc Af Amer 55 (*)    All other components within normal limits  CBC WITH DIFFERENTIAL/PLATELET - Abnormal; Notable for the following components:   WBC 11.8 (*)    Neutro Abs 10.2 (*)    Abs Immature Granulocytes 0.08 (*)    All other components within normal limits  SARS CORONAVIRUS 2 (HOSPITAL ORDER, [place] [facility])  URINALYSIS, ROUTINE W REFLEX MICROSCOPIC  TSH    EKG None  Radiology Dg Lumbar Spine 2-3 Views  Result Date: 06/28/2019 CLINICAL DATA:  Recent fall with low back pain, initial encounter EXAM: LUMBAR SPINE - 2-3 VIEW COMPARISON:  CT dated 02/26/2018 FINDINGS: There are changes consistent with prior vertebral augmentation at T12. Underlying compression deformities at T11 and L1 are noted. The T11 fracture is new from the prior CT examination. The L1 fracture is chronic. Degenerative changes are noted at L3-4 somewhat progressed from the prior CT examination. No other acute abnormality is noted. Diffuse aortic calcifications are seen. IMPRESSION: Changes of prior vertebral augmentation at T12. Interval fracture at T11 when compared with the prior CT examination. This is of uncertain chronicity however. Electronically Signed   By: Inez Catalina M.D.   On: 06/28/2019 14:51   Dg Chest Port 1 View  Result Date: 06/28/2019 CLINICAL DATA:  Known left hip fracture EXAM: PORTABLE CHEST 1 VIEW COMPARISON:  03/17/2019 FINDINGS: Cardiac shadow is stable. Patient is rotated significantly to the right accentuating the mediastinal markings. Aortic calcifications are seen. No acute bony abnormality is noted. Lungs are clear bilaterally. IMPRESSION: No acute abnormality noted. Electronically Signed   By: Inez Catalina M.D.   On: 06/28/2019 14:49   Dg Hip Unilat W Or W/o Pelvis 2-3 Views Left  Result Date: 06/28/2019 CLINICAL DATA:  Hip pain after recent fall EXAM: DG HIP (WITH OR WITHOUT PELVIS) 2-3V  LEFT; DG HIP (WITH OR WITHOUT PELVIS) 2-3V RIGHT COMPARISON:  06/15/2019 FINDINGS: There is an acute fracture of the left femoral neck, which appears predominantly subcapital. There is at least 2.5 cm of foreshortening and 1.5 cm of lateral displacement. Left femoroacetabular joint remains aligned without dislocation. Chronic deformity of the proximal right femur status post ORIF with intact hardware. The osseous structures are diffusely demineralized. No additional acute fractures are identified. SI joints and pubic symphysis are intact with mild degenerative changes. Advanced aortoiliac atherosclerotic calcifications. IMPRESSION: Acute, moderately displaced, foreshortened fracture of the left femoral neck. These results will be called to the ordering clinician or representative by the Radiologist Assistant, and communication documented in the PACS or zVision Dashboard. Electronically Signed   By: Davina Poke M.D.   On: 06/28/2019 11:46   Dg Hip Unilat W Or W/o Pelvis 2-3 Views Right  Result Date: 06/28/2019 CLINICAL DATA:  Hip pain after recent fall EXAM: DG HIP (WITH OR WITHOUT PELVIS) 2-3V LEFT; DG HIP (WITH OR WITHOUT PELVIS) 2-3V RIGHT COMPARISON:  06/15/2019 FINDINGS: There is an acute fracture of the left femoral neck, which appears predominantly subcapital. There is at least 2.5 cm of foreshortening and 1.5 cm of lateral displacement. Left femoroacetabular joint remains aligned without dislocation. Chronic deformity of the proximal right femur status post ORIF with intact hardware. The osseous structures are diffusely demineralized. No additional acute fractures are identified. SI joints and pubic symphysis are intact with mild degenerative changes. Advanced aortoiliac atherosclerotic calcifications. IMPRESSION: Acute, moderately displaced, foreshortened fracture of the left femoral neck. These results will be called to the ordering clinician or representative by the Radiologist Assistant, and  communication documented in the PACS or zVision Dashboard. Electronically Signed   By: Davina Poke M.D.   On: 06/28/2019 11:46    Procedures .Critical Care Performed by: Elnora Morrison, MD Authorized by: Elnora Morrison, MD   Critical care provider statement:    Critical care time (minutes):  45   Critical care start time:  06/28/2019 3:00 PM   Critical care end time:  06/28/2019 3:45 PM   Critical care time was exclusive of:  Teaching time and separately billable procedures and treating other patients   Critical care was necessary to treat or prevent imminent or life-threatening deterioration of the following conditions:  Cardiac failure   Critical care was time spent personally by me on the following activities:  Discussions with consultants, evaluation of patient's response to treatment, examination of patient, ordering and performing treatments and interventions, ordering and review of laboratory studies, ordering and review of radiographic studies, pulse oximetry, re-evaluation of patient's condition, obtaining history from patient or surrogate and review of old charts   (including critical care time)  Medications Ordered in ED Medications  0.9 %  sodium chloride infusion ( Intravenous New Bag/Given 06/28/19 1446)  fentaNYL (SUBLIMAZE) injection 25 mcg (25 mcg Intravenous Given 06/28/19 1447)  diltiazem (CARDIZEM) 100 mg in dextrose 5 % 100 mL (1 mg/mL) infusion (5 mg/hr Intravenous New Bag/Given 06/28/19 1548)  0.9 %  sodium chloride infusion (has no administration in time range)  diltiazem (CARDIZEM) injection 10 mg (10 mg Intravenous Given 06/28/19 1548)     Initial Impression / Assessment and Plan / ED Course  I have reviewed the triage vital signs and the nursing notes.  Pertinent labs & imaging results that were available during my care of the patient were reviewed by me and considered in my medical decision making (see chart for details).  Clinical  Course as of Jun 27 1548    Tue Jun 28, 2019  1348 Pulse Rate(!): 160 [BM]    Clinical Course User Index [BM] Meccariello, Bernita Raisin, DO      Patient presents for assessment of tachycardia and hip fracture.  On exam clinical concern for atrial fibrillation with rapid ventricular response.  No history of known similar diagnosis.  Physician assistant called family and no known history of this and updated family on hip fracture and plan for admission.  Reviewed x-rays concern for compression fracture T12 region, old compression fractures as well difficult to tell chronicity.  Chest x-ray reviewed no acute findings.  Blood work reviewed BUN 59, hemoglobin normal, white count 11.8.  Concern for dehydration clinically, IV fluids ordered.  Calcium channel blocker bolus and drip for titration ordered.  Discussed case with orthopedics recommend transfer to [facility] Dr Erlinda Hong.  Fentanyl prn ordered.   Spoke with hospitalist about patient clinical concerns and she will assist with transferring to [facility].   Final Clinical Impressions(s) / ED Diagnoses   Final diagnoses:  Atrial fibrillation with RVR ([place])  Fall, initial encounter  Closed left hip fracture, initial encounter [facility]  Traumatic compression fracture of T11 thoracic vertebra, closed, initial encounter [facility])    ED Discharge Orders    None       Elnora Morrison, MD 06/28/19 1549

## 2019-06-29 ENCOUNTER — Inpatient Hospital Stay (HOSPITAL_COMMUNITY): Payer: Medicare Other

## 2019-06-29 ENCOUNTER — Other Ambulatory Visit (HOSPITAL_COMMUNITY): Payer: Medicare Other

## 2019-06-29 ENCOUNTER — Inpatient Hospital Stay (HOSPITAL_COMMUNITY): Payer: Medicare Other | Admitting: Certified Registered Nurse Anesthetist

## 2019-06-29 ENCOUNTER — Encounter (HOSPITAL_COMMUNITY): Payer: Self-pay | Admitting: Orthopedic Surgery

## 2019-06-29 ENCOUNTER — Encounter (HOSPITAL_COMMUNITY)
Admission: EM | Disposition: A | Discharge status: Skilled Nursing Facility | Payer: Self-pay | Source: Home / Self Care | Attending: Internal Medicine

## 2019-06-29 DIAGNOSIS — S72002A Fracture of unspecified part of neck of left femur, initial encounter for closed fracture: Secondary | ICD-10-CM

## 2019-06-29 DIAGNOSIS — I340 Nonrheumatic mitral (valve) insufficiency: Secondary | ICD-10-CM

## 2019-06-29 DIAGNOSIS — Z01810 Encounter for preprocedural cardiovascular examination: Secondary | ICD-10-CM

## 2019-06-29 DIAGNOSIS — I4891 Unspecified atrial fibrillation: Secondary | ICD-10-CM

## 2019-06-29 DIAGNOSIS — I361 Nonrheumatic tricuspid (valve) insufficiency: Secondary | ICD-10-CM

## 2019-06-29 HISTORY — PX: ANTERIOR APPROACH HEMI HIP ARTHROPLASTY: SHX6690

## 2019-06-29 LAB — BASIC METABOLIC PANEL
Anion gap: 13 (ref 5–15)
BUN: 37 mg/dL — ABNORMAL HIGH (ref 8–23)
CO2: 22 mmol/L (ref 22–32)
Calcium: 9.3 mg/dL (ref 8.9–10.3)
Chloride: 107 mmol/L (ref 98–111)
Creatinine, Ser: 0.76 mg/dL (ref 0.44–1.00)
GFR calc Af Amer: >60 mL/min (ref >60)
GFR calc non Af Amer: >60 mL/min (ref >60)
Glucose, Bld: 110 mg/dL — ABNORMAL HIGH (ref 70–99)
Potassium: 4.3 mmol/L (ref 3.5–5.1)
Sodium: 142 mmol/L (ref 135–145)

## 2019-06-29 LAB — CBC
HCT: 37.7 % (ref 36.0–46.0)
HCT: 37.9 % (ref 36.0–46.0)
Hemoglobin: 12 g/dL (ref 12.0–15.0)
Hemoglobin: 12.2 g/dL (ref 12.0–15.0)
MCH: 29.1 pg (ref 26.0–34.0)
MCH: 29.5 pg (ref 26.0–34.0)
MCHC: 31.8 g/dL (ref 30.0–36.0)
MCHC: 32.2 g/dL (ref 30.0–36.0)
MCV: 91.5 fL (ref 80.0–100.0)
MCV: 91.5 fL (ref 80.0–100.0)
Platelets: 306 10*3/uL (ref 150–400)
Platelets: 299 10*3/uL (ref 150–400)
RBC: 4.12 MIL/uL (ref 3.87–5.11)
RBC: 4.14 MIL/uL (ref 3.87–5.11)
RDW: 14.4 % (ref 11.5–15.5)
RDW: 14.1 % (ref 11.5–15.5)
WBC: 11.3 10*3/uL — ABNORMAL HIGH (ref 4.0–10.5)
WBC: 14.5 10*3/uL — ABNORMAL HIGH (ref 4.0–10.5)
nRBC: 0 % (ref 0.0–0.2)
nRBC: 0 % (ref 0.0–0.2)

## 2019-06-29 LAB — GLUCOSE, CAPILLARY: Glucose-Capillary: 116 mg/dL — ABNORMAL HIGH (ref 70–99)

## 2019-06-29 LAB — ECHOCARDIOGRAM COMPLETE
Height: 71 in
Weight: 1975.32 oz

## 2019-06-29 LAB — CREATININE, SERUM
Creatinine, Ser: 0.61 mg/dL (ref 0.44–1.00)
GFR calc Af Amer: >60 mL/min (ref >60)
GFR calc non Af Amer: >60 mL/min (ref >60)

## 2019-06-29 LAB — SURGICAL PCR SCREEN
MRSA, PCR: NEGATIVE
Staphylococcus aureus: POSITIVE — AB

## 2019-06-29 SURGERY — HEMIARTHROPLASTY, HIP, DIRECT ANTERIOR APPROACH, FOR FRACTURE
Anesthesia: General | Site: Hip | Laterality: Left

## 2019-06-29 MED ORDER — SODIUM CHLORIDE 0.9 % IV SOLN
INTRAVENOUS | Status: DC | PRN
  Administered 2019-06-29: 25 ug/min via INTRAVENOUS

## 2019-06-29 MED ORDER — VANCOMYCIN HCL 1 G IV SOLR
INTRAVENOUS | Status: DC | PRN
  Administered 2019-06-29: 1000 mg

## 2019-06-29 MED ORDER — LIDOCAINE 2% (20 MG/ML) 5 ML SYRINGE
INTRAMUSCULAR | Status: AC
  Filled 2019-06-29: qty 5

## 2019-06-29 MED ORDER — DEXAMETHASONE SODIUM PHOSPHATE 10 MG/ML IJ SOLN
INTRAMUSCULAR | Status: AC
  Filled 2019-06-29: qty 1

## 2019-06-29 MED ORDER — LIDOCAINE 2% (20 MG/ML) 5 ML SYRINGE
INTRAMUSCULAR | Status: DC | PRN
  Administered 2019-06-29: 100 mg via INTRAVENOUS

## 2019-06-29 MED ORDER — HYDROMORPHONE HCL 1 MG/ML IJ SOLN
1.0000 mg | Freq: Once | INTRAMUSCULAR | Status: AC
  Administered 2019-06-29: 1 mg via INTRAVENOUS
  Filled 2019-06-29: qty 1

## 2019-06-29 MED ORDER — POLYETHYLENE GLYCOL 3350 17 G PO PACK: 17.0000 g | PACK | Freq: Every day | ORAL | Status: DC | PRN

## 2019-06-29 MED ORDER — 0.9 % SODIUM CHLORIDE (POUR BTL) OPTIME
TOPICAL | Status: DC | PRN
  Administered 2019-06-29: 1000 mL

## 2019-06-29 MED ORDER — TRANEXAMIC ACID 1000 MG/10ML IV SOLN
2000.0000 mg | INTRAVENOUS | Status: DC
  Filled 2019-06-29: qty 20

## 2019-06-29 MED ORDER — SUGAMMADEX SODIUM 200 MG/2ML IV SOLN
INTRAVENOUS | Status: DC | PRN
  Administered 2019-06-29: 110 mg via INTRAVENOUS

## 2019-06-29 MED ORDER — ONDANSETRON HCL 4 MG/2ML IJ SOLN
INTRAMUSCULAR | Status: DC | PRN
  Administered 2019-06-29: 4 mg via INTRAVENOUS

## 2019-06-29 MED ORDER — LACTATED RINGERS IV SOLN
INTRAVENOUS | Status: DC
  Administered 2019-06-29: 18:00:00 via INTRAVENOUS

## 2019-06-29 MED ORDER — ALUM & MAG HYDROXIDE-SIMETH 200-200-20 MG/5ML PO SUSP: 30.0000 mL | ORAL | Status: DC | PRN

## 2019-06-29 MED ORDER — POVIDONE-IODINE 10 % EX SWAB: 2.0000 "application " | Freq: Once | CUTANEOUS | Status: DC

## 2019-06-29 MED ORDER — MUPIROCIN CALCIUM 2 % EX CREA
TOPICAL_CREAM | Freq: Every day | CUTANEOUS | Status: DC
  Filled 2019-06-29: qty 15

## 2019-06-29 MED ORDER — DILTIAZEM HCL-DEXTROSE 100-5 MG/100ML-% IV SOLN (PREMIX)
5.0000 mg/h | INTRAVENOUS | Status: DC
  Administered 2019-06-29 – 2019-06-30 (×5): 15 mg/h via INTRAVENOUS
  Administered 2019-06-30: 5 mg/h via INTRAVENOUS
  Filled 2019-06-29 (×9): qty 100

## 2019-06-29 MED ORDER — METOPROLOL TARTRATE 50 MG PO TABS
75.0000 mg | ORAL_TABLET | Freq: Two times a day (BID) | ORAL | Status: DC
  Administered 2019-06-30 (×3): 75 mg via ORAL
  Filled 2019-06-29 (×4): qty 1

## 2019-06-29 MED ORDER — LACTATED RINGERS IV SOLN
INTRAVENOUS | Status: DC | PRN
  Administered 2019-06-29: 18:00:00 via INTRAVENOUS

## 2019-06-29 MED ORDER — PROPOFOL 10 MG/ML IV BOLUS
INTRAVENOUS | Status: DC | PRN
  Administered 2019-06-29: 70 mg via INTRAVENOUS

## 2019-06-29 MED ORDER — ONDANSETRON HCL 4 MG/2ML IJ SOLN: 4.0000 mg | Freq: Four times a day (QID) | INTRAMUSCULAR | Status: DC | PRN

## 2019-06-29 MED ORDER — MENTHOL 3 MG MT LOZG: 1.0000 | LOZENGE | OROMUCOSAL | Status: DC | PRN

## 2019-06-29 MED ORDER — SODIUM CHLORIDE 0.9 % IV SOLN
INTRAVENOUS | Status: DC
  Administered 2019-06-29 – 2019-07-03 (×4): via INTRAVENOUS

## 2019-06-29 MED ORDER — HYDROCODONE-ACETAMINOPHEN 5-325 MG PO TABS
1.0000 | ORAL_TABLET | ORAL | Status: DC | PRN
  Administered 2019-06-30: 1 via ORAL
  Administered 2019-07-01: 2 via ORAL
  Administered 2019-07-01 – 2019-07-05 (×3): 1 via ORAL
  Administered 2019-07-06 – 2019-07-11 (×18): 2 via ORAL
  Filled 2019-06-29 (×2): qty 2
  Filled 2019-06-29: qty 1
  Filled 2019-06-29: qty 2
  Filled 2019-06-29: qty 1
  Filled 2019-06-29 (×2): qty 2
  Filled 2019-06-29: qty 1
  Filled 2019-06-29 (×4): qty 2
  Filled 2019-06-29: qty 1
  Filled 2019-06-29 (×10): qty 2

## 2019-06-29 MED ORDER — FENTANYL CITRATE (PF) 100 MCG/2ML IJ SOLN: 25.0000 ug | INTRAMUSCULAR | Status: DC | PRN

## 2019-06-29 MED ORDER — FENTANYL CITRATE (PF) 250 MCG/5ML IJ SOLN
INTRAMUSCULAR | Status: DC | PRN
  Administered 2019-06-29: 100 ug via INTRAVENOUS
  Administered 2019-06-29: 25 ug via INTRAVENOUS

## 2019-06-29 MED ORDER — SORBITOL 70 % SOLN
30.0000 mL | Freq: Every day | Status: DC | PRN
  Administered 2019-07-09: 30 mL via ORAL
  Filled 2019-06-29: qty 30

## 2019-06-29 MED ORDER — MAGNESIUM CITRATE PO SOLN
1.0000 | Freq: Once | ORAL | Status: AC | PRN
  Administered 2019-07-09: 1 via ORAL
  Filled 2019-06-29: qty 296

## 2019-06-29 MED ORDER — METHOCARBAMOL 500 MG PO TABS
500.0000 mg | ORAL_TABLET | Freq: Four times a day (QID) | ORAL | Status: DC | PRN
  Administered 2019-06-30 – 2019-07-11 (×14): 500 mg via ORAL
  Filled 2019-06-29 (×15): qty 1

## 2019-06-29 MED ORDER — HYDROCODONE-ACETAMINOPHEN 5-325 MG PO TABS: 1.0000 | ORAL_TABLET | Freq: Three times a day (TID) | ORAL | 0 refills | Status: DC | PRN

## 2019-06-29 MED ORDER — HYDROCODONE-ACETAMINOPHEN 7.5-325 MG PO TABS
1.0000 | ORAL_TABLET | ORAL | Status: DC | PRN
  Administered 2019-06-30 – 2019-07-07 (×15): 2 via ORAL
  Filled 2019-06-29 (×15): qty 2

## 2019-06-29 MED ORDER — CEFAZOLIN SODIUM-DEXTROSE 2-4 GM/100ML-% IV SOLN
2.0000 g | INTRAVENOUS | Status: AC
  Administered 2019-06-29: 2 g via INTRAVENOUS
  Filled 2019-06-29 (×2): qty 100

## 2019-06-29 MED ORDER — DEXAMETHASONE SODIUM PHOSPHATE 10 MG/ML IJ SOLN
INTRAMUSCULAR | Status: DC | PRN
  Administered 2019-06-29: 5 mg via INTRAVENOUS

## 2019-06-29 MED ORDER — ADULT MULTIVITAMIN W/MINERALS CH
1.0000 | ORAL_TABLET | Freq: Every day | ORAL | Status: DC
  Administered 2019-06-30 – 2019-07-11 (×12): 1 via ORAL
  Filled 2019-06-29 (×12): qty 1

## 2019-06-29 MED ORDER — ONDANSETRON HCL 4 MG/2ML IJ SOLN
INTRAMUSCULAR | Status: AC
  Filled 2019-06-29: qty 2

## 2019-06-29 MED ORDER — MUPIROCIN 2 % EX OINT
1.0000 "application " | TOPICAL_OINTMENT | Freq: Every day | CUTANEOUS | Status: DC
  Administered 2019-06-29 – 2019-07-11 (×13): 1 via TOPICAL
  Filled 2019-06-29: qty 22

## 2019-06-29 MED ORDER — ACETAMINOPHEN 500 MG PO TABS
500.0000 mg | ORAL_TABLET | Freq: Four times a day (QID) | ORAL | Status: AC
  Administered 2019-06-30 (×3): 500 mg via ORAL
  Filled 2019-06-29 (×4): qty 1

## 2019-06-29 MED ORDER — DOCUSATE SODIUM 100 MG PO CAPS: 100.0000 mg | ORAL_CAPSULE | Freq: Two times a day (BID) | ORAL | Status: DC

## 2019-06-29 MED ORDER — PROPOFOL 10 MG/ML IV BOLUS
INTRAVENOUS | Status: AC
  Filled 2019-06-29: qty 20

## 2019-06-29 MED ORDER — SODIUM CHLORIDE 0.9 % IV SOLN
INTRAVENOUS | Status: DC
  Administered 2019-06-29: 12:00:00 via INTRAVENOUS

## 2019-06-29 MED ORDER — MUPIROCIN 2 % EX OINT
1.0000 "application " | TOPICAL_OINTMENT | Freq: Two times a day (BID) | CUTANEOUS | Status: AC
  Administered 2019-06-29 – 2019-07-03 (×8): 1 via TOPICAL
  Filled 2019-06-29 (×2): qty 22

## 2019-06-29 MED ORDER — ENOXAPARIN SODIUM 40 MG/0.4ML ~~LOC~~ SOLN: 40.0000 mg | Freq: Every day | SUBCUTANEOUS | 13 refills | Status: DC

## 2019-06-29 MED ORDER — TRANEXAMIC ACID-NACL 1000-0.7 MG/100ML-% IV SOLN
1000.0000 mg | INTRAVENOUS | Status: AC
  Administered 2019-06-29: 18:00:00 1000 mg via INTRAVENOUS
  Filled 2019-06-29: qty 100

## 2019-06-29 MED ORDER — METHOCARBAMOL 1000 MG/10ML IJ SOLN
500.0000 mg | Freq: Four times a day (QID) | INTRAVENOUS | Status: DC | PRN
  Filled 2019-06-29: qty 5

## 2019-06-29 MED ORDER — ENSURE ENLIVE PO LIQD
237.0000 mL | Freq: Two times a day (BID) | ORAL | Status: DC
  Administered 2019-06-30: 10:00:00 237 mL via ORAL

## 2019-06-29 MED ORDER — SODIUM CHLORIDE 0.9 % IR SOLN
Status: DC | PRN
  Administered 2019-06-29: 3000 mL

## 2019-06-29 MED ORDER — ACETAMINOPHEN 325 MG PO TABS: 325.0000 mg | ORAL_TABLET | Freq: Four times a day (QID) | ORAL | Status: DC | PRN

## 2019-06-29 MED ORDER — ONDANSETRON HCL 4 MG PO TABS: 4.0000 mg | ORAL_TABLET | Freq: Four times a day (QID) | ORAL | Status: DC | PRN

## 2019-06-29 MED ORDER — PHENYLEPHRINE 40 MCG/ML (10ML) SYRINGE FOR IV PUSH (FOR BLOOD PRESSURE SUPPORT)
PREFILLED_SYRINGE | INTRAVENOUS | Status: AC
  Filled 2019-06-29: qty 10

## 2019-06-29 MED ORDER — ROCURONIUM BROMIDE 10 MG/ML (PF) SYRINGE
PREFILLED_SYRINGE | INTRAVENOUS | Status: AC
  Filled 2019-06-29: qty 10

## 2019-06-29 MED ORDER — CEFAZOLIN SODIUM-DEXTROSE 2-4 GM/100ML-% IV SOLN
2.0000 g | Freq: Three times a day (TID) | INTRAVENOUS | Status: AC
  Administered 2019-06-30 (×2): 2 g via INTRAVENOUS
  Filled 2019-06-29 (×3): qty 100

## 2019-06-29 MED ORDER — ROCURONIUM BROMIDE 50 MG/5ML IV SOSY
PREFILLED_SYRINGE | INTRAVENOUS | Status: DC | PRN
  Administered 2019-06-29: 50 mg via INTRAVENOUS

## 2019-06-29 MED ORDER — TRANEXAMIC ACID 1000 MG/10ML IV SOLN
INTRAVENOUS | Status: DC | PRN
  Administered 2019-06-29: 19:00:00 1000 mg via TOPICAL

## 2019-06-29 MED ORDER — HYDROMORPHONE HCL 1 MG/ML IJ SOLN
0.5000 mg | INTRAMUSCULAR | Status: DC | PRN
  Administered 2019-06-29 – 2019-07-05 (×10): 0.5 mg via INTRAVENOUS
  Filled 2019-06-29 (×11): qty 0.5

## 2019-06-29 MED ORDER — PHENOL 1.4 % MT LIQD: 1.0000 | OROMUCOSAL | Status: DC | PRN

## 2019-06-29 MED ORDER — ENOXAPARIN SODIUM 40 MG/0.4ML ~~LOC~~ SOLN
40.0000 mg | SUBCUTANEOUS | Status: DC
  Administered 2019-06-30 – 2019-07-11 (×12): 40 mg via SUBCUTANEOUS
  Filled 2019-06-29 (×12): qty 0.4

## 2019-06-29 MED ORDER — PHENYLEPHRINE HCL (PRESSORS) 10 MG/ML IV SOLN
INTRAVENOUS | Status: DC | PRN
  Administered 2019-06-29: 80 ug via INTRAVENOUS

## 2019-06-29 MED ORDER — FENTANYL CITRATE (PF) 250 MCG/5ML IJ SOLN
INTRAMUSCULAR | Status: AC
  Filled 2019-06-29: qty 5

## 2019-06-29 MED ORDER — ENOXAPARIN SODIUM 40 MG/0.4ML ~~LOC~~ SOLN: 40.0000 mg | Freq: Every day | SUBCUTANEOUS | 14 refills | Status: DC

## 2019-06-29 MED ORDER — MORPHINE SULFATE (PF) 2 MG/ML IV SOLN
1.0000 mg | INTRAVENOUS | Status: DC | PRN
  Administered 2019-06-30 – 2019-07-01 (×3): 1 mg via INTRAVENOUS
  Filled 2019-06-29 (×3): qty 1

## 2019-06-29 SURGICAL SUPPLY — 60 items
BAG DECANTER FOR FLEXI CONT (MISCELLANEOUS) ×3 IMPLANT
BIPOLAR DEPUY 50 (Hips) ×2 IMPLANT
BIPOLAR DEPUY 50MM (Hips) ×1 IMPLANT
CELLS DAT CNTRL 66122 CELL SVR (MISCELLANEOUS) IMPLANT
COVER PERINEAL POST (MISCELLANEOUS) ×3 IMPLANT
COVER SURGICAL LIGHT HANDLE (MISCELLANEOUS) ×3 IMPLANT
COVER WAND RF STERILE (DRAPES) ×3 IMPLANT
DRAPE C-ARM 42X72 X-RAY (DRAPES) ×3 IMPLANT
DRAPE POUCH INSTRU U-SHP 10X18 (DRAPES) ×5 IMPLANT
DRAPE STERI IOBAN 125X83 (DRAPES) ×3 IMPLANT
DRAPE U-SHAPE 47X51 STRL (DRAPES) ×6 IMPLANT
DRSG AQUACEL AG ADV 3.5X10 (GAUZE/BANDAGES/DRESSINGS) ×3 IMPLANT
DRSG MEPILEX BORDER 4X8 (GAUZE/BANDAGES/DRESSINGS) ×2 IMPLANT
DURAPREP 26ML APPLICATOR (WOUND CARE) ×6 IMPLANT
ELECT BLADE 4.0 EZ CLEAN MEGAD (MISCELLANEOUS) ×3
ELECT REM PT RETURN 9FT ADLT (ELECTROSURGICAL) ×3
ELECTRODE BLDE 4.0 EZ CLN MEGD (MISCELLANEOUS) ×1 IMPLANT
ELECTRODE REM PT RTRN 9FT ADLT (ELECTROSURGICAL) ×1 IMPLANT
FACESHIELD WRAPAROUND (MASK) ×3 IMPLANT
FACESHIELD WRAPAROUND OR TEAM (MASK) IMPLANT
GLOVE BIOGEL PI IND STRL 7.0 (GLOVE) ×1 IMPLANT
GLOVE BIOGEL PI INDICATOR 7.0 (GLOVE) ×2
GLOVE ECLIPSE 7.0 STRL STRAW (GLOVE) ×6 IMPLANT
GLOVE SKINSENSE NS SZ7.5 (GLOVE) ×2
GLOVE SKINSENSE STRL SZ7.5 (GLOVE) ×1 IMPLANT
GLOVE SURG SYN 7.5  E (GLOVE) ×8
GLOVE SURG SYN 7.5 E (GLOVE) ×4 IMPLANT
GLOVE SURG SYN 7.5 PF PI (GLOVE) ×4 IMPLANT
GOWN STRL REIN XL XLG (GOWN DISPOSABLE) ×3 IMPLANT
GOWN STRL REUS W/ TWL LRG LVL3 (GOWN DISPOSABLE) IMPLANT
GOWN STRL REUS W/ TWL XL LVL3 (GOWN DISPOSABLE) ×1 IMPLANT
GOWN STRL REUS W/TWL LRG LVL3 (GOWN DISPOSABLE)
GOWN STRL REUS W/TWL XL LVL3 (GOWN DISPOSABLE) ×3
HANDPIECE INTERPULSE COAX TIP (DISPOSABLE) ×3
HEAD BIPOLAR DEPUY 50 (Hips) IMPLANT
HEAD FEM STD 28X+1.5 STRL (Hips) ×2 IMPLANT
HOOD PEEL AWAY FLYTE STAYCOOL (MISCELLANEOUS) ×6 IMPLANT
IV NS IRRIG 3000ML ARTHROMATIC (IV SOLUTION) ×3 IMPLANT
KIT BASIN OR (CUSTOM PROCEDURE TRAY) ×3 IMPLANT
MARKER SKIN DUAL TIP RULER LAB (MISCELLANEOUS) ×3 IMPLANT
NDL SPNL 18GX3.5 QUINCKE PK (NEEDLE) ×1 IMPLANT
NEEDLE SPNL 18GX3.5 QUINCKE PK (NEEDLE) ×3 IMPLANT
PACK TOTAL JOINT (CUSTOM PROCEDURE TRAY) ×3 IMPLANT
PACK UNIVERSAL I (CUSTOM PROCEDURE TRAY) ×3 IMPLANT
RETRACTOR WND ALEXIS 18 MED (MISCELLANEOUS) IMPLANT
RTRCTR WOUND ALEXIS 18CM MED (MISCELLANEOUS)
SAW OSC TIP CART 19.5X105X1.3 (SAW) ×3 IMPLANT
SET HNDPC FAN SPRY TIP SCT (DISPOSABLE) ×1 IMPLANT
STAPLER VISISTAT 35W (STAPLE) IMPLANT
STEM CORAIL KA15 (Stem) ×2 IMPLANT
SUT ETHIBOND 2 V 37 (SUTURE) ×3 IMPLANT
SUT ETHILON 2 0 FS 18 (SUTURE) ×2 IMPLANT
SUT VIC AB 1 CTX 36 (SUTURE) ×3
SUT VIC AB 1 CTX36XBRD ANBCTR (SUTURE) ×1 IMPLANT
SUT VIC AB 2-0 CT1 27 (SUTURE) ×6
SUT VIC AB 2-0 CT1 TAPERPNT 27 (SUTURE) ×2 IMPLANT
SYR 50ML LL SCALE MARK (SYRINGE) ×3 IMPLANT
TOWEL GREEN STERILE (TOWEL DISPOSABLE) ×3 IMPLANT
TRAY CATH 16FR W/PLASTIC CATH (SET/KITS/TRAYS/PACK) IMPLANT
YANKAUER SUCT BULB TIP NO VENT (SUCTIONS) ×3 IMPLANT

## 2019-06-29 NOTE — Anesthesia Preprocedure Evaluation (Addendum)
Anesthesia Evaluation  Patient identified by MRN, date of birth, ID band Patient awake    Reviewed: Allergy & Precautions, H&P , NPO status , Patient's Chart, lab work & pertinent test results, reviewed documented beta blocker date and time   Airway Mallampati: II  TM Distance: >3 FB Neck ROM: Full    Dental no notable dental hx. (+) Edentulous Upper, Edentulous Lower, Dental Advisory Given   Pulmonary neg pulmonary ROS,    Pulmonary exam normal breath sounds clear to auscultation       Cardiovascular hypertension, Pt. on medications and Pt. on home beta blockers + dysrhythmias Atrial Fibrillation  Rhythm:Irregular Rate:Normal + Systolic murmurs    Neuro/Psych Anxiety negative neurological ROS     GI/Hepatic Neg liver ROS, GERD  Medicated and Controlled,  Endo/Other  negative endocrine ROS  Renal/GU negative Renal ROS  negative genitourinary   Musculoskeletal  (+) Arthritis , Osteoarthritis,    Abdominal   Peds  Hematology  (+) Blood dyscrasia, anemia ,   Anesthesia Other Findings   Reproductive/Obstetrics negative OB ROS                            Anesthesia Physical Anesthesia Plan  ASA: III  Anesthesia Plan: General   Post-op Pain Management:    Induction: Intravenous  PONV Risk Score and Plan: 4 or greater and Ondansetron, Dexamethasone and Treatment may vary due to age or medical condition  Airway Management Planned: Oral ETT  Additional Equipment:   Intra-op Plan:   Post-operative Plan: Extubation in OR  Informed Consent: I have reviewed the patients History and Physical, chart, labs and discussed the procedure including the risks, benefits and alternatives for the proposed anesthesia with the patient or authorized representative who has indicated his/her understanding and acceptance.     Dental advisory given  Plan Discussed with: CRNA  Anesthesia Plan Comments:          Anesthesia Quick Evaluation

## 2019-06-29 NOTE — Transfer of Care (Signed)
Immediate Anesthesia Transfer of Care Note  Patient: Betty Carpenter  Procedure(s) Performed: Left ANTERIOR APPROACH HEMI HIP ARTHROPLASTY (Left Hip)  Patient Location: PACU  Anesthesia Type:General  Level of Consciousness: awake  Airway & Oxygen Therapy: Patient Spontanous Breathing and Patient connected to face mask oxygen  Post-op Assessment: Report given to RN and Post -op Vital signs reviewed and stable  Post vital signs: Reviewed and stable  Last Vitals:  Vitals Value Taken Time  BP 153/85 06/29/19 1937  Temp    Pulse    Resp 18 06/29/19 1938  SpO2    Vitals shown include unvalidated device data.  Last Pain:  Vitals:   06/29/19 0330  TempSrc: Oral  PainSc:          Complications: No apparent anesthesia complications.

## 2019-06-29 NOTE — Progress Notes (Signed)
Patient had mittens placed to her hands due to pulling at her IV lines, heart monitor leads, BP cuff, and foley.  She has managed to remove them with ease multiple times.  The last time was around 0530 at which time she pulled out her IV access and Foley catheter.   IV access was replaced but I was unable to replace her foley do to difficulty of visualizing the urethra and pain with movement to patient. Schorr, Rhetta Mura, NP notified via page of the above.  New orders placed in [facility] and carried out.

## 2019-06-29 NOTE — Op Note (Signed)
Left ANTERIOR APPROACH HEMI HIP ARTHROPLASTY  Procedure Note Betty Carpenter   [id number]  Pre-op Diagnosis: Hip Fracture     Post-op Diagnosis: same   Operative Procedures  1. Prosthetic replacement for femoral neck fracture. CPT [id number]  Personnel  Surgeon(s): Leandrew Koyanagi, MD  ASSIST: Madalyn Rob, PA-C; necessary for the timely completion of procedure and due to complexity of procedure.   Anesthesia: general  Prosthesis: Depuy Femur: [place] KA 15 Head: 50 mm size: +1.5 Bearing Type: bipolar  Hip Hemiarthroplasty (Anterior Approach) Op Note:  After informed consent was obtained and the operative extremity marked in the holding area, the patient was brought back to the operating room and placed supine on the HANA table. Next, the operative extremity was prepped and draped in normal sterile fashion. Surgical timeout occurred verifying patient identification, surgical site, surgical procedure and administration of antibiotics.  A modified anterior Smith-Peterson approach to the hip was performed, using the interval between tensor fascia lata and sartorius.  Dissection was carried bluntly down onto the anterior hip capsule. The lateral femoral circumflex vessels were identified and coagulated. A capsulotomy was performed and the capsular flaps tagged for later repair.  Fluoroscopy was utilized to prepare for the femoral neck cut. The neck osteotomy was performed. The femoral head was removed and found a 50 mm head was the appropriate fit.    We then turned our attention to the femur.  After placing the femoral hook, the leg was taken to externally rotated, extended and adducted position taking care to perform soft tissue releases to allow for adequate mobilization of the femur. Soft tissue was cleared from the shoulder of the greater trochanter and the hook elevator used to improve exposure of the proximal femur. Sequential broaching performed up to a size 15. Trial neck and head were  placed. The leg was brought back up to neutral and the construct reduced. The position and sizing of components, offset and leg lengths were checked using fluoroscopy. Stability of the construct was checked in extension and external rotation without any subluxation or impingement of prosthesis. We dislocated the prosthesis, dropped the leg back into position, removed trial components, and irrigated copiously. The final stem and head was then placed, the leg brought back up, the system reduced and fluoroscopy used to verify positioning.  We irrigated, obtained hemostasis and closed the capsule using #2 ethibond suture.  The fascia was closed with #1 vicryl plus, the deep fat layer was closed with 0 vicryl, the subcutaneous layers closed with 2.0 Vicryl Plus and the skin closed with staples. A sterile dressing was applied. The patient was awakened in the operating room and taken to recovery in stable condition. All sponge, needle, and instrument counts were correct at the end of the case.   Position: supine  Complications: see description of procedure.  Time Out: performed   Drains/Packing: none  Estimated blood loss: see anesthesia record  Returned to Recovery Room: in good condition.   Antibiotics: yes   Mechanical VTE (DVT) Prophylaxis: sequential compression devices, TED thigh-high  Chemical VTE (DVT) Prophylaxis: lovenox  Fluid Replacement: Crystalloid: see anesthesia record  Specimens Removed: 1 to pathology   Sponge and Instrument Count Correct? yes   PACU: portable radiograph - low AP   Admission: inpatient status, start PT & OT POD#1  Plan/RTC: Return in 2 weeks for staple removal. Return in 6 weeks to see MD.  Weight Bearing/Load Lower Extremity: full  Hip precautions: none  N. Eduard Roux,  MD [facility] 5:30 PM   * No implants in log *

## 2019-06-29 NOTE — Progress Notes (Addendum)
0800: Dr. Erlinda Hong at bedside. Wants foley placed. Will place orders.  0830: Foley inserted with Betty Carpenter. Urine return noted.  1010: Page to MD. Patient c/o excruciating pain. Asking for additional medication along with topical pain med. HR remains elevated.   1800: Spoke with daughter Betty Carpenter. Updated on condition. Obtained consent with Marlana Latus as second verifier.   1815: Restraints removed. Patient going to Surgery.   1820: Three rings placed in cup and placed in patient closet with label.

## 2019-06-29 NOTE — Consult Note (Addendum)
[place] Nurse wound consult note Reason for Consult: Consult requested for BLE.   Right outer ankle with chronic full thickness wound; 1X1X.2cm, bone palpable.  Wound is 60% red, 40% yellow, mod amt tan drainage, no odor Left inner heel with chronic full thickness wound; surrounded by a dry yellow slightly raised callous; .3X.3X.2cm, dry woundbed, 50% yellow, 50% red, small amt tan drainage, no odor.  Some of the callous surrounding the edges lifts easily.  Left anterior plantar foot with dry intact slightly raised callous without open wound or drainage, .3X.3cm; no topical treatment indicated. Dressing procedure/placement/frequency: Bactroban to promote moist healing to left heel callous wound.  Aquacel to absorb drainage and provide antimicrobial benefits to right ankle wound.  Please re-consult if further assistance is needed.  Thank-you,  Julien Girt MSN, [place], [place], [place], [place]

## 2019-06-29 NOTE — Consult Note (Signed)
Cardiology Consultation:   Patient ID: JALIN ERPELDING MRN: [id number]; DOB: May 07, 1925  Admit date: 06/28/2019 Date of Consult: 06/29/2019  Primary Care Provider: Terald Sleeper, PA-C Primary Cardiologist: New Primary Electrophysiologist:  None    Patient Profile:   Betty Carpenter is a 85 y/o female who resides at an ALF w/ her husband, with PMH that includes breast cancer s/p mastectomy., prior LE DVT treated w/ Xarelto, h/o repeated falls and HTN but no prior cardiac history, admitted after mechanical fall at home resulting in left femoral neck fracture. She was found to be in new atrial fibrillation w/ RVR. Cardiology consulted for afib w/ RVR and preoperative cardiac evaluation for clearance prior to anticipated arthroplasty, at the request of Dr. Erlinda Hong, Orthopedic Surgery.    History of Present Illness:    Betty Carpenter is a 85 y/o female who resides at an ALF w/ her husband, with PMH that includes breast cancer s/p mastectomy., prior LE DVT treated w/ Xarelto, h/o repeated falls and HTN but no prior cardiac history, admitted after mechanical fall at home resulting in left femoral neck fracture. She was found to be in new atrial fibrillation w/ RVR. Cardiology consulted for afib w/ RVR and preoperative cardiac evaluation for clearance prior to anticipated arthroplasty, at the request of Dr. Erlinda Hong, Orthopedic Surgery.   Her fall occurred several days ago, but pt did not immediately report to ALF staff. She later mentioned sever hip pain to her daughter who requested a hip Xray. Pt does not recall exactly what happened but denies any LOC. It was unwitnessed. Her husband was not in the same room when this occurred. She does have a prior history of mechanical falls. Daughter reports she had a prior fall in the last few months.    She denies any cardiac symptoms and is completely asymptomatic w/ her afib. She denies CP, dyspnea, palpitations, dizziness, syncope/ near syncope. No s/s of CHF. No edema on exam. Laying  flat in bed with normal breathing. Prior to fall, she was not very active but had no exertional symptoms with basic ADLs.   There is no prior h/o stroke or TIA. Daughter reports prior h/o LE DVT that was treated w/ Xarelto. She tolerated Xarelto well. No h/o abnormal bleeding.   Initial EKG in the ED showed atrial fibrillation w/ RVR 155 bpm and possible LVH. No prior EKGs for comparison. 2D echo pending. Hs Troponin normal x 2 at 16>>16. BP moderately hypertensive. She was started on IV Cardizem for rate control. PO metoprolol also given. Prior to admit she was taking 25 mg of Lopressor BID.  Dose increased to 75 mg on admit. TSH normal. CBC with mild leukocytosis at 11.8 but afebrile. CXR negative.  UA w/ rare bacteria, nitrate negative, no leukocytes but + for Hgb, ketones and protein. No anemia. Hgb 13.  BMP with normal K 3.6. Mg also normal at 2.3. She appears mildly dehydrated. SCr 1.03 and BUN 59, up from 0.80 and 39 2 months ago. COVID negative.   Leg/ hip pain currently controlled. Remains in afib w/ fluctuating rates between the low 100s-120s. SBP in the 140s. Daughter at bedside.   Heart Pathway Score:     Past Medical History:  Diagnosis Date  . Arthritis   . Chronic back pain   . Closed wedge compression fracture of L1 vertebra ([place]) 2004  . Hypertension   . Sciatica     Past Surgical History:  Procedure Laterality Date  . APPENDECTOMY    .  HIP PINNING,CANNULATED Right 03/18/2019   Procedure: CANNULATED HIP PINNING;  Surgeon: Carole Civil, MD;  Location: [facility];  Service: Orthopedics;  Laterality: Right;  11 am      Home Medications:  Prior to Admission medications   Medication Sig Start Date End Date Taking? Authorizing Provider  acetaminophen (TYLENOL) 325 MG tablet Take 650 mg by mouth every 4 (four) hours.   Yes [provider]  amLODipine (NORVASC) 5 MG tablet Take 1 tablet (5 mg total) by mouth daily. 03/23/19  Yes Barton Dubois, MD  celecoxib (CELEBREX)  200 MG capsule Take 1 capsule (200 mg total) by mouth 2 (two) times daily. 03/22/19  Yes Barton Dubois, MD  Docusate Sodium 100 MG capsule Take 100 mg by mouth 2 (two) times daily.   Yes [provider]  feeding supplement, ENSURE ENLIVE, (ENSURE ENLIVE) LIQD Take 237 mLs by mouth 2 (two) times daily between meals. 03/22/19  Yes Barton Dubois, MD  loperamide (IMODIUM A-D) 2 MG tablet Take 2 mg by mouth 4 (four) times daily as needed for diarrhea or loose stools.   Yes [provider]  lubiprostone (AMITIZA) 24 MCG capsule Take 24 mcg by mouth 2 (two) times daily.   Yes [provider]  magnesium hydroxide (MILK OF MAGNESIA) 400 MG/5ML suspension Take 30 mLs by mouth 2 (two) times daily as needed for mild constipation.   Yes [provider]  Menthol-Methyl Salicylate (SALONPAS PAIN RELIEF PATCH) Jessamine Apply 1 patch topically daily as needed.   Yes [provider]  metoprolol tartrate (LOPRESSOR) 25 MG tablet Take 1 tablet (25 mg total) by mouth 2 (two) times daily. 03/22/19  Yes Barton Dubois, MD  Multiple Vitamins-Minerals (MULTIVITAMIN WITH MINERALS) tablet Take 1 tablet by mouth daily.   Yes [provider]  NON FORMULARY Diet Type:   NAS 03/22/19  Yes [provider]  ondansetron (ZOFRAN-ODT) 4 MG disintegrating tablet Take 4 mg by mouth every 6 (six) hours as needed for nausea or vomiting.  07/23/17  Yes [provider]  OXYCONTIN 20 MG 12 hr tablet Take 1 tablet (20 mg total) by mouth every 12 (twelve) hours. 06/24/19  Yes Terald Sleeper, PA-C  pantoprazole (PROTONIX) 20 MG tablet Take 1 tablet (20 mg total) by mouth daily. 03/22/19 03/21/20 Yes Barton Dubois, MD  tiZANidine (ZANAFLEX) 4 MG tablet Take 4 mg by mouth every 6 (six) hours as needed for muscle spasms.   Yes [provider]  UNABLE TO FIND Wound Care -   Clean wound right malleolar with N/S, apply santyl ointment and cover with allevyn foam dressing and change daily  and as needed 03/22/19  Yes [provider]  Vitamin D, Ergocalciferol, (DRISDOL) 50000 units CAPS capsule Take 50,000 Units by mouth See admin instructions. Twice a week on Tues, Saturday 08/21/16  Yes [provider]    Inpatient Medications: Scheduled Meds: . docusate sodium  100 mg Oral BID  . lubiprostone  24 mcg Oral BID  . metoprolol tartrate  75 mg Oral BID  . mupirocin ointment  1 application Topical BID  . mupirocin ointment  1 application Topical Daily  . pantoprazole  20 mg Oral Daily   Continuous Infusions: . sodium chloride 75 mL/hr at 06/29/19 1159  . diltiazem (CARDIZEM) infusion 15 mg/hr (06/29/19 1127)   PRN Meds: acetaminophen **OR** acetaminophen, HYDROmorphone (DILAUDID) injection, metoprolol tartrate, ondansetron **OR** ondansetron (ZOFRAN) IV, polyethylene glycol  Allergies:    Allergies  Allergen Reactions  .  Cymbalta [Duloxetine Hcl]     " couldn't make water"- unable to urinate    Social History:   Social History   Socioeconomic History  . Marital status: Married    Spouse name: Not on file  . Number of children: Not on file  . Years of education: Not on file  . Highest education level: Not on file  Occupational History  . Not on file  Social Needs  . Financial resource strain: Not on file  . Food insecurity    Worry: Not on file    Inability: Not on file  . Transportation needs    Medical: Not on file    Non-medical: Not on file  Tobacco Use  . Smoking status: Never Smoker  . Smokeless tobacco: Never Used  Substance and Sexual Activity  . Alcohol use: Not on file  . Drug use: Not on file  . Sexual activity: Never  Lifestyle  . Physical activity    Days per week: Not on file    Minutes per session: Not on file  . Stress: Not on file  Relationships  . Social Herbalist on phone: Not on file    Gets together: Not on file    Attends religious service: Not on file    Active member of club or organization:  Not on file    Attends meetings of clubs or organizations: Not on file    Relationship status: Not on file  . Intimate partner violence    Fear of current or ex partner: Not on file    Emotionally abused: Not on file    Physically abused: Not on file    Forced sexual activity: Not on file  Other Topics Concern  . Not on file  Social History Narrative  . Not on file    Family History:    Family History  Problem Relation Age of Onset  . Cancer Maternal Grandfather      ROS:  Please see the history of present illness.   All other ROS reviewed and negative.     Physical Exam/Data:   Vitals:   06/29/19 1028 06/29/19 1100 06/29/19 1129 06/29/19 1158  BP: 135/81 (!) 147/96 (!) 134/92 (!) 154/99  Pulse: 70 92 80 (!) 107  Resp: (!) 24 (!) 29 (!) 22 (!) 24  Temp:    98.3 F (36.8 C)  TempSrc:      SpO2: 99% 99% 98% 96%  Weight:      Height:        Intake/Output Summary (Last 24 hours) at 06/29/2019 1311 Last data filed at 06/29/2019 1202 Gross per 24 hour  Intake 1909.12 ml  Output 500 ml  Net 1409.12 ml   Last 3 Weights 06/28/2019 06/28/2019 06/24/2019  Weight (lbs) 123 lb 7.3 oz 124 lb 124 lb 3.2 oz  Weight (kg) 56 kg 56.246 kg 56.337 kg     Body mass index is 17.22 kg/m.  General:  Elderly, thin WF in no acute distress HEENT: normal Lymph: no adenopathy Neck: no JVD Endocrine:  No thryomegaly Vascular: No carotid bruits; FA pulses 2+ bilaterally without bruits  Cardiac:  irregularly irregular rhythm w/ tachy rate, 2/6 murmur best heard at the LLSB Lungs:  clear to auscultation bilaterally, no wheezing, rhonchi or rales  Abd: soft, nontender, no hepatomegaly  Ext: no edema Musculoskeletal:  externally rotated LLE Skin: warm and dry  Neuro:  CNs 2-12 intact, no focal abnormalities noted Psych:  Normal affect  EKG:  The EKG was personally reviewed and demonstrates:  Initial EKG showed afib w/ RVR 155 bpm, slight inferolateral ST depression likely rate relate Most  recent EKG shows afib 126 bpm Telemetry:  Telemetry was personally reviewed and demonstrates:  afib w/ RVR, low 100s-120s   Relevant CV Studies: 2D Echo pending   Laboratory Data:  High Sensitivity Troponin:   Recent Labs  Lab 06/28/19 1756 06/28/19 2018  TROPONINIHS 16 16     Cardiac EnzymesNo results for input(s): TROPONINI in the last 168 hours. No results for input(s): TROPIPOC in the last 168 hours.  Chemistry Recent Labs  Lab 06/28/19 1432 06/29/19 0218  NA 139 142  K 3.6 4.3  CL 102 107  CO2 24 22  GLUCOSE 124* 110*  BUN 59* 37*  CREATININE 1.03* 0.76  CALCIUM 9.4 9.3  GFRNONAA 47* >60  GFRAA 55* >60  ANIONGAP 13 13    No results for input(s): PROT, ALBUMIN, AST, ALT, ALKPHOS, BILITOT in the last 168 hours. Hematology Recent Labs  Lab 06/28/19 1432 06/29/19 0218  WBC 11.8* 11.3*  RBC 4.49 4.12  HGB 13.0 12.0  HCT 41.3 37.7  MCV 92.0 91.5  MCH 29.0 29.1  MCHC 31.5 31.8  RDW 14.4 14.4  PLT 341 306   BNPNo results for input(s): BNP, PROBNP in the last 168 hours.  DDimer No results for input(s): DDIMER in the last 168 hours.   Radiology/Studies:  Dg Lumbar Spine 2-3 Views  Result Date: 06/28/2019 CLINICAL DATA:  Recent fall with low back pain, initial encounter EXAM: LUMBAR SPINE - 2-3 VIEW COMPARISON:  CT dated 02/26/2018 FINDINGS: There are changes consistent with prior vertebral augmentation at T12. Underlying compression deformities at T11 and L1 are noted. The T11 fracture is new from the prior CT examination. The L1 fracture is chronic. Degenerative changes are noted at L3-4 somewhat progressed from the prior CT examination. No other acute abnormality is noted. Diffuse aortic calcifications are seen. IMPRESSION: Changes of prior vertebral augmentation at T12. Interval fracture at T11 when compared with the prior CT examination. This is of uncertain chronicity however. Electronically Signed   By: Inez Catalina M.D.   On: 06/28/2019 14:51   Ct Head Wo  Contrast  Result Date: 06/28/2019 CLINICAL DATA:  Fall. EXAM: CT HEAD WITHOUT CONTRAST TECHNIQUE: Contiguous axial images were obtained from the base of the skull through the vertex without intravenous contrast. COMPARISON:  07/14/2016 FINDINGS: Brain: There is no evidence of acute infarct, intracranial hemorrhage, mass, midline shift, or extra-axial fluid collection. Generalized cerebral atrophy is not greater than expected for age. Patchy hypodensities involving the left greater than right cerebral hemispheric white matter have at most mildly progressed and are nonspecific but compatible with mild chronic small vessel ischemic disease. Vascular: Calcified atherosclerosis at the skull base. No hyperdense vessel. Skull: No fracture or focal osseous lesion. Sinuses/Orbits: Visualized paranasal sinuses and mastoid air cells are clear bilateral cataract extraction is noted. Other: None. IMPRESSION: 1. No evidence of acute intracranial abnormality. 2. Mild chronic small vessel ischemic disease. Electronically Signed   By: Logan Bores M.D.   On: 06/28/2019 18:29   Dg Chest Port 1 View  Result Date: 06/28/2019 CLINICAL DATA:  Known left hip fracture EXAM: PORTABLE CHEST 1 VIEW COMPARISON:  03/17/2019 FINDINGS: Cardiac shadow is stable. Patient is rotated significantly to the right accentuating the mediastinal markings. Aortic calcifications are seen. No acute bony abnormality is noted. Lungs are clear bilaterally. IMPRESSION: No acute abnormality noted. Electronically  Signed   By: Inez Catalina M.D.   On: 06/28/2019 14:49   Dg Hip Unilat W Or W/o Pelvis 2-3 Views Left  Result Date: 06/28/2019 CLINICAL DATA:  Hip pain after recent fall EXAM: DG HIP (WITH OR WITHOUT PELVIS) 2-3V LEFT; DG HIP (WITH OR WITHOUT PELVIS) 2-3V RIGHT COMPARISON:  06/15/2019 FINDINGS: There is an acute fracture of the left femoral neck, which appears predominantly subcapital. There is at least 2.5 cm of foreshortening and 1.5 cm of  lateral displacement. Left femoroacetabular joint remains aligned without dislocation. Chronic deformity of the proximal right femur status post ORIF with intact hardware. The osseous structures are diffusely demineralized. No additional acute fractures are identified. SI joints and pubic symphysis are intact with mild degenerative changes. Advanced aortoiliac atherosclerotic calcifications. IMPRESSION: Acute, moderately displaced, foreshortened fracture of the left femoral neck. These results will be called to the ordering clinician or representative by the Radiologist Assistant, and communication documented in the PACS or zVision Dashboard. Electronically Signed   By: Davina Poke M.D.   On: 06/28/2019 11:46   Dg Hip Unilat W Or W/o Pelvis 2-3 Views Right  Result Date: 06/28/2019 CLINICAL DATA:  Hip pain after recent fall EXAM: DG HIP (WITH OR WITHOUT PELVIS) 2-3V LEFT; DG HIP (WITH OR WITHOUT PELVIS) 2-3V RIGHT COMPARISON:  06/15/2019 FINDINGS: There is an acute fracture of the left femoral neck, which appears predominantly subcapital. There is at least 2.5 cm of foreshortening and 1.5 cm of lateral displacement. Left femoroacetabular joint remains aligned without dislocation. Chronic deformity of the proximal right femur status post ORIF with intact hardware. The osseous structures are diffusely demineralized. No additional acute fractures are identified. SI joints and pubic symphysis are intact with mild degenerative changes. Advanced aortoiliac atherosclerotic calcifications. IMPRESSION: Acute, moderately displaced, foreshortened fracture of the left femoral neck. These results will be called to the ordering clinician or representative by the Radiologist Assistant, and communication documented in the PACS or zVision Dashboard. Electronically Signed   By: Davina Poke M.D.   On: 06/28/2019 11:46    Assessment and Plan:   Betty Carpenter is a 85 y/o female who resides at an ALF w/ her husband, with PMH  that includes breast cancer s/p mastectomy., prior LE DVT treated w/ Xarelto, h/o repeated falls and HTN but no prior cardiac history, admitted after mechanical fall at home resulting in left femoral neck fracture. She was found to be in new atrial fibrillation w/ RVR. Cardiology consulted for afib w/ RVR and preoperative cardiac evaluation for clearance prior to anticipated arthroplasty, at the request of Dr. Erlinda Hong, Orthopedic Surgery.   1. Left Hip Fx: 2/2 fall. Plan for surgical treatment.   2. Atrial Fibrillation w/ RVR: unknown duration, as pt is completely asymptomatic. Initial rate in the 150s. Overall improved but still with RVR in the low 100s-120s. BP stable in the 998P systolic. She is on max dose of IV Cardizem 15 mg/hr and her home Lopressor has been increased from 25>>75 mg BID. TSH, electrolytes, H/H, Hs Trop, UA and CXR unremarkable. Echo pending. May need additional dose of metoprolol prior to OR. Will discuss w/ MD. In regards to stoke risk, her CHA2DS2 VASc score is at least 3 for HTN and Age > 58. This patients CHA2DS2-VASc Score and unadjusted Ischemic Stroke Rate (% per year) is equal to 3.2 % stroke rate/year from a score of 3. However, given her history of falls, risk may outweigh the benefit. Will defer to MD. If decision is  made to start anticoagulation, will initiate once safe to do so from a surgical standpoint.    3. Preoperative Evaluation: other than atrial fibrillation, she has no other known cardiac issues. She denies symptoms concerning for ischemic heart disease. Faint murmur detected on exam, best heard at LLSB but echo not yet completed. No old echos to review. However, she denies any h/o exertional CP, dyspnea, syncope or near syncope. Completely asymptomatic w/ her afib even with rate in the 150s earlier.  She is at an increased risk for complications given her advanced aged but surgery not prohibitive. Ok to proceed with surgery. We will follow along with you.  For  questions or updates, please contact [facility] Please consult [email or web address] for contact info under     Signed, Betty Jester, PA-C  06/29/2019 1:11 PM

## 2019-06-29 NOTE — Discharge Instructions (Signed)
° ° °  1. Change dressings as needed °2. May shower but keep incisions covered and dry °3. Take lovenox to prevent blood clots °4. Take stool softeners as needed °5. Take pain meds as needed ° °

## 2019-06-29 NOTE — Anesthesia Postprocedure Evaluation (Signed)
Anesthesia Post Note  Patient: Betty Carpenter  Procedure(s) Performed: Left ANTERIOR APPROACH HEMI HIP ARTHROPLASTY (Left Hip)     Patient location during evaluation: PACU Anesthesia Type: General Level of consciousness: sedated and patient cooperative Pain management: pain level controlled Vital Signs Assessment: post-procedure vital signs reviewed and stable Respiratory status: spontaneous breathing Cardiovascular status: stable Anesthetic complications: no    Last Vitals:  Vitals:   06/29/19 2046 06/29/19 2157  BP: (!) 154/79   Pulse: (!) 115   Resp: 16   Temp: 36.5 C 36.8 C  SpO2:      Last Pain:  Vitals:   06/29/19 2157  TempSrc: Oral  PainSc:                  Nolon Nations

## 2019-06-29 NOTE — Consult Note (Signed)
ORTHOPAEDIC CONSULTATION  REQUESTING PHYSICIAN: Monica Becton, MD  Chief Complaint: Left femoral neck fracture  HPI: Betty Carpenter is a 85 y.o. female who presents with left hip fracture s/p mechanical fall approximately 4-5 days ago.  She underwent hip pinning for right hip fracture in May has been ambulating with wheelchair since then.  Prior to right hip fracture, she was ambulating with walker.  The patient endorses severe pain in the left hip, that does not radiate, grinding in quality, worse with any movement, better with immobilization.  Denies LOC/fever/chills/nausea/vomiting.  Lives with daughter.    Past Medical History:  Diagnosis Date   Arthritis    Chronic back pain    Closed wedge compression fracture of L1 vertebra ([place]) 2004   Hypertension    Sciatica    Past Surgical History:  Procedure Laterality Date   APPENDECTOMY     HIP PINNING,CANNULATED Right 03/18/2019   Procedure: CANNULATED HIP PINNING;  Surgeon: Carole Civil, MD;  Location: [facility];  Service: Orthopedics;  Laterality: Right;  11 am    Social History   Socioeconomic History   Marital status: Married    Spouse name: Not on file   Number of children: Not on file   Years of education: Not on file   Highest education level: Not on file  Occupational History   Not on file  Social Needs   Financial resource strain: Not on file   Food insecurity    Worry: Not on file    Inability: Not on file   Transportation needs    Medical: Not on file    Non-medical: Not on file  Tobacco Use   Smoking status: Never Smoker   Smokeless tobacco: Never Used  Substance and Sexual Activity   Alcohol use: Not on file   Drug use: Not on file   Sexual activity: Never  Lifestyle   Physical activity    Days per week: Not on file    Minutes per session: Not on file   Stress: Not on file  Relationships   Social connections    Talks on phone: Not on file    Gets together: Not on file      Attends religious service: Not on file    Active member of club or organization: Not on file    Attends meetings of clubs or organizations: Not on file    Relationship status: Not on file  Other Topics Concern   Not on file  Social History Narrative   Not on file   Family History  Problem Relation Age of Onset   Cancer Maternal Grandfather    Allergies  Allergen Reactions   Cymbalta [Duloxetine Hcl]     " couldn't make water"- unable to urinate   Prior to Admission medications   Medication Sig Start Date End Date Taking? Authorizing Provider  acetaminophen (TYLENOL) 325 MG tablet Take 650 mg by mouth every 4 (four) hours.   Yes [provider]  amLODipine (NORVASC) 5 MG tablet Take 1 tablet (5 mg total) by mouth daily. 03/23/19  Yes Barton Dubois, MD  celecoxib (CELEBREX) 200 MG capsule Take 1 capsule (200 mg total) by mouth 2 (two) times daily. 03/22/19  Yes Barton Dubois, MD  Docusate Sodium 100 MG capsule Take 100 mg by mouth 2 (two) times daily.   Yes [provider]  feeding supplement, ENSURE ENLIVE, (ENSURE ENLIVE) LIQD Take 237 mLs by mouth 2 (two) times daily between meals. 03/22/19  Yes  Barton Dubois, MD  loperamide (IMODIUM A-D) 2 MG tablet Take 2 mg by mouth 4 (four) times daily as needed for diarrhea or loose stools.   Yes [provider]  lubiprostone (AMITIZA) 24 MCG capsule Take 24 mcg by mouth 2 (two) times daily.   Yes [provider]  magnesium hydroxide (MILK OF MAGNESIA) 400 MG/5ML suspension Take 30 mLs by mouth 2 (two) times daily as needed for mild constipation.   Yes [provider]  Menthol-Methyl Salicylate (SALONPAS PAIN RELIEF PATCH) [place] Apply 1 patch topically daily as needed.   Yes [provider]  metoprolol tartrate (LOPRESSOR) 25 MG tablet Take 1 tablet (25 mg total) by mouth 2 (two) times daily. 03/22/19  Yes Barton Dubois, MD  Multiple Vitamins-Minerals (MULTIVITAMIN WITH MINERALS) tablet Take  1 tablet by mouth daily.   Yes [provider]  NON FORMULARY Diet Type:   NAS 03/22/19  Yes [provider]  ondansetron (ZOFRAN-ODT) 4 MG disintegrating tablet Take 4 mg by mouth every 6 (six) hours as needed for nausea or vomiting.  07/23/17  Yes [provider]  OXYCONTIN 20 MG 12 hr tablet Take 1 tablet (20 mg total) by mouth every 12 (twelve) hours. 06/24/19  Yes Terald Sleeper, PA-C  pantoprazole (PROTONIX) 20 MG tablet Take 1 tablet (20 mg total) by mouth daily. 03/22/19 03/21/20 Yes Barton Dubois, MD  tiZANidine (ZANAFLEX) 4 MG tablet Take 4 mg by mouth every 6 (six) hours as needed for muscle spasms.   Yes [provider]  UNABLE TO FIND Wound Care -   Clean wound right malleolar with N/S, apply santyl ointment and cover with allevyn foam dressing and change daily and as needed 03/22/19  Yes [provider]  Vitamin D, Ergocalciferol, (DRISDOL) 50000 units CAPS capsule Take 50,000 Units by mouth See admin instructions. Twice a week on Tues, Saturday 08/21/16  Yes [provider]   Dg Lumbar Spine 2-3 Views  Result Date: 06/28/2019 CLINICAL DATA:  Recent fall with low back pain, initial encounter EXAM: LUMBAR SPINE - 2-3 VIEW COMPARISON:  CT dated 02/26/2018 FINDINGS: There are changes consistent with prior vertebral augmentation at T12. Underlying compression deformities at T11 and L1 are noted. The T11 fracture is new from the prior CT examination. The L1 fracture is chronic. Degenerative changes are noted at L3-4 somewhat progressed from the prior CT examination. No other acute abnormality is noted. Diffuse aortic calcifications are seen. IMPRESSION: Changes of prior vertebral augmentation at T12. Interval fracture at T11 when compared with the prior CT examination. This is of uncertain chronicity however. Electronically Signed   By: Inez Catalina M.D.   On: 06/28/2019 14:51   Ct Head Wo Contrast  Result Date: 06/28/2019 CLINICAL DATA:  Fall.  EXAM: CT HEAD WITHOUT CONTRAST TECHNIQUE: Contiguous axial images were obtained from the base of the skull through the vertex without intravenous contrast. COMPARISON:  07/14/2016 FINDINGS: Brain: There is no evidence of acute infarct, intracranial hemorrhage, mass, midline shift, or extra-axial fluid collection. Generalized cerebral atrophy is not greater than expected for age. Patchy hypodensities involving the left greater than right cerebral hemispheric white matter have at most mildly progressed and are nonspecific but compatible with mild chronic small vessel ischemic disease. Vascular: Calcified atherosclerosis at the skull base. No hyperdense vessel. Skull: No fracture or focal osseous lesion. Sinuses/Orbits: Visualized paranasal sinuses and mastoid air cells are clear bilateral cataract extraction is noted. Other: None. IMPRESSION: 1. No evidence of acute intracranial abnormality.  2. Mild chronic small vessel ischemic disease. Electronically Signed   By: Logan Bores M.D.   On: 06/28/2019 18:29   Dg Chest Port 1 View  Result Date: 06/28/2019 CLINICAL DATA:  Known left hip fracture EXAM: PORTABLE CHEST 1 VIEW COMPARISON:  03/17/2019 FINDINGS: Cardiac shadow is stable. Patient is rotated significantly to the right accentuating the mediastinal markings. Aortic calcifications are seen. No acute bony abnormality is noted. Lungs are clear bilaterally. IMPRESSION: No acute abnormality noted. Electronically Signed   By: Inez Catalina M.D.   On: 06/28/2019 14:49   Dg Hip Unilat W Or W/o Pelvis 2-3 Views Left  Result Date: 06/28/2019 CLINICAL DATA:  Hip pain after recent fall EXAM: DG HIP (WITH OR WITHOUT PELVIS) 2-3V LEFT; DG HIP (WITH OR WITHOUT PELVIS) 2-3V RIGHT COMPARISON:  06/15/2019 FINDINGS: There is an acute fracture of the left femoral neck, which appears predominantly subcapital. There is at least 2.5 cm of foreshortening and 1.5 cm of lateral displacement. Left femoroacetabular joint remains  aligned without dislocation. Chronic deformity of the proximal right femur status post ORIF with intact hardware. The osseous structures are diffusely demineralized. No additional acute fractures are identified. SI joints and pubic symphysis are intact with mild degenerative changes. Advanced aortoiliac atherosclerotic calcifications. IMPRESSION: Acute, moderately displaced, foreshortened fracture of the left femoral neck. These results will be called to the ordering clinician or representative by the Radiologist Assistant, and communication documented in the PACS or zVision Dashboard. Electronically Signed   By: Davina Poke M.D.   On: 06/28/2019 11:46   Dg Hip Unilat W Or W/o Pelvis 2-3 Views Right  Result Date: 06/28/2019 CLINICAL DATA:  Hip pain after recent fall EXAM: DG HIP (WITH OR WITHOUT PELVIS) 2-3V LEFT; DG HIP (WITH OR WITHOUT PELVIS) 2-3V RIGHT COMPARISON:  06/15/2019 FINDINGS: There is an acute fracture of the left femoral neck, which appears predominantly subcapital. There is at least 2.5 cm of foreshortening and 1.5 cm of lateral displacement. Left femoroacetabular joint remains aligned without dislocation. Chronic deformity of the proximal right femur status post ORIF with intact hardware. The osseous structures are diffusely demineralized. No additional acute fractures are identified. SI joints and pubic symphysis are intact with mild degenerative changes. Advanced aortoiliac atherosclerotic calcifications. IMPRESSION: Acute, moderately displaced, foreshortened fracture of the left femoral neck. These results will be called to the ordering clinician or representative by the Radiologist Assistant, and communication documented in the PACS or zVision Dashboard. Electronically Signed   By: Davina Poke M.D.   On: 06/28/2019 11:46    All pertinent xrays, MRI, CT independently reviewed and interpreted  Positive ROS: All other systems have been reviewed and were otherwise negative with  the exception of those mentioned in the HPI and as above.  Physical Exam: General: Alert, no acute distress Cardiovascular: No pedal edema Respiratory: No cyanosis, no use of accessory musculature GI: No organomegaly, abdomen is soft and non-tender Skin: No lesions in the area of chief complaint Neurologic: Sensation intact distally Psychiatric: Patient is confused this morning Lymphatic: No axillary or cervical lymphadenopathy  MUSCULOSKELETAL:  - severe pain with movement of the hip and extremity - skin intact - NVI distally - compartments soft  Assessment: Left femoral neck fracture Afib with RVR  Plan: - partial hip replacement is recommended, daughter has given verbal consent for surgery - afib with RVR overnight with rate in 130s, will need HR to be under better control for anesthesia - cards consult pending - surgery is tentatively planned  for this afternoon  Thank you for the consult and the opportunity to see Ms. [place]  N. Eduard Roux, MD 8:16 AM

## 2019-06-29 NOTE — Anesthesia Procedure Notes (Signed)
Procedure Name: Intubation Date/Time: 06/29/2019 6:19 PM Performed by: Kathryne Hitch, CRNA Pre-anesthesia Checklist: Patient identified, Emergency Drugs available, Suction available and Patient being monitored Patient Re-evaluated:Patient Re-evaluated prior to induction Oxygen Delivery Method: Circle system utilized Preoxygenation: Pre-oxygenation with 100% oxygen Induction Type: IV induction Ventilation: Mask ventilation without difficulty Laryngoscope Size: Miller and 2 Grade View: Grade I Tube type: Oral Tube size: 7.0 mm Number of attempts: 1 Airway Equipment and Method: Stylet and Oral airway Placement Confirmation: ETT inserted through vocal cords under direct vision,  positive ETCO2 and breath sounds checked- equal and bilateral Secured at: 22 cm Tube secured with: Tape Dental Injury: Teeth and Oropharynx as per pre-operative assessment

## 2019-06-29 NOTE — Progress Notes (Signed)
  Echocardiogram 2D Echocardiogram has been performed.  Bobbye Charleston 06/29/2019, 3:56 PM

## 2019-06-29 NOTE — Progress Notes (Addendum)
   06/29/19 2046  Vitals  Temp 97.7 F (36.5 C)  Temp Source Oral  BP (!) 154/79  MAP (mmHg) 101  BP Location Left Arm  BP Method Automatic  Patient Position (if appropriate) Lying  Pulse Rate (!) 115  Pulse Rate Source Monitor  ECG Heart Rate (!) 115  Cardiac Rhythm Atrial fibrillation  Resp 16  Oxygen Therapy  O2 Device Nasal Cannula  O2 Flow Rate (L/min) 2 L/min  Pain Assessment  Pain Scale 0-10  Pain Score 0  MEWS Score  MEWS RR 0  MEWS Pulse 2  MEWS Systolic 0  MEWS LOC 1  MEWS Temp 0  MEWS Score 3  MEWS Score Color Yellow      Pt received from PACU. Mepilex intact to Left hip .   LLE with + 2 pulse and good capillary refill. Pt able to wiggle toes.  Cardizem gtt infusing @ 33ml/hr via L PIV.  VSS . Pt denies pain  at this time. To continue to monitor and treat pt per MD and Nursing orders

## 2019-06-29 NOTE — Progress Notes (Signed)
PROGRESS NOTE    Betty Carpenter  FHL:[id number] DOB: 1925/05/06 DOA: 06/28/2019 PCP: Terald Sleeper, PA-C   Brief Narrative:  85 year old female with history of her with right mastectomy, hypertension was brought to the emergency department reporting that had a fall about 4 days ago.  Patient could not remember the details of the fall.  Per daughter patient fell backwards into her buttock.  At baseline patient walks with the help of the walker.  X-ray of the hip revealed left femoral neck fracture.  Orthopedic surgery is consulted.  Patient received 7 mg of morphine with oxygen saturations dropped to 80s.  Patient was placed on nonrebreather and was given Narcan.  In the emergency department patient was found to have atrial fibrillation with rapid ventricular rate of 160 -170.  Patient does not have any previous history of atrial fibrillation.  TSH 1.2.  COVID-19 test came back to be negative.  Initial set of cardiac enzymes were negative.  Orthopedic surgery is consulted, underwent hemiarthroplasty.  Assessment & Plan:  ##Left femoral neck fracture -Patient underwent hemiarthroplasty -Continue with pain management as needed  ##New onset atrial fibrillation with rapid ventricular rate -Consulted cardiology -Troponins x3 came back to be negative -Echocardiogram showed preserved EF -Currently on Cardizem drip, also increase the dose of metoprolol 200 mg twice daily.  ##Dementia -Do not have patient's baseline   Principal Problem:   Closed displaced fracture of left femoral neck (HCC) Active Problems:   Atrial fibrillation with rapid ventricular response (HCC)      DVT prophylaxis: Lovenox when cleared by orthopedic surgery Code Status: (Full/.   Family Communication: No family members are present  disposition Plan: Based on PT evaluation   Consultants:   Orthopedic surgery  Cardiology  Procedures: Left hip hemiarthroplasty on 06/29/2019  Subjective: Complaining of pain in  the left hip  Objective: Vitals:   06/29/19 1100 06/29/19 1129 06/29/19 1158 06/29/19 1500  BP: (!) 147/96 (!) 134/92 (!) 154/99 (!) 143/96  Pulse: 92 80 (!) 107 (!) 101  Resp: (!) 29 (!) 22 (!) 24   Temp:   98.3 F (36.8 C)   TempSrc:      SpO2: 99% 98% 96% 94%  Weight:      Height:        Intake/Output Summary (Last 24 hours) at 06/29/2019 1642 Last data filed at 06/29/2019 1405 Gross per 24 hour  Intake 1909.12 ml  Output 500 ml  Net 1409.12 ml   Filed Weights   06/28/19 1317  Weight: 56 kg    Examination:  General exam: Mild distress from pain Respiratory system: Clear to auscultation. Respiratory effort normal. Cardiovascular system: S1 & S2 heard, iRRR. No JVD, murmurs, rubs, gallops or clicks. No pedal edema. Gastrointestinal system: Abdomen is nondistended, soft and nontender. No organomegaly or masses felt. Normal bowel sounds heard. Central nervous system: Alert and oriented. No focal neurological deficits. Extremities: Significant pain in the left hip Skin: No rashes, lesions or ulcers Psychiatry: Poor judgment   Data Reviewed: I have personally reviewed following labs and imaging studies  CBC: Recent Labs  Lab 06/28/19 1432 06/29/19 0218  WBC 11.8* 11.3*  NEUTROABS 10.2*  --   HGB 13.0 12.0  HCT 41.3 37.7  MCV 92.0 91.5  PLT 341 373   Basic Metabolic Panel: Recent Labs  Lab 06/28/19 1432 06/28/19 2018 06/29/19 0218  NA 139  --  142  K 3.6  --  4.3  CL 102  --  107  CO2 24  --  22  GLUCOSE 124*  --  110*  BUN 59*  --  37*  CREATININE 1.03*  --  0.76  CALCIUM 9.4  --  9.3  MG  --  2.3  --    GFR: Estimated Creatinine Clearance: 39.7 mL/min (by C-G formula based on SCr of 0.76 mg/dL). Liver Function Tests: No results for input(s): AST, ALT, ALKPHOS, BILITOT, PROT, ALBUMIN in the last 168 hours. No results for input(s): LIPASE, AMYLASE in the last 168 hours. No results for input(s): AMMONIA in the last 168 hours. Coagulation  Profile: No results for input(s): INR, PROTIME in the last 168 hours. Cardiac Enzymes: No results for input(s): CKTOTAL, CKMB, CKMBINDEX, TROPONINI in the last 168 hours. BNP (last 3 results) No results for input(s): PROBNP in the last 8760 hours. HbA1C: No results for input(s): HGBA1C in the last 72 hours. CBG: Recent Labs  Lab 06/29/19 1132  GLUCAP 116*   Lipid Profile: No results for input(s): CHOL, HDL, LDLCALC, TRIG, CHOLHDL, LDLDIRECT in the last 72 hours. Thyroid Function Tests: Recent Labs    06/28/19 1432  TSH 1.230   Anemia Panel: No results for input(s): VITAMINB12, FOLATE, FERRITIN, TIBC, IRON, RETICCTPCT in the last 72 hours. Sepsis Labs: No results for input(s): PROCALCITON, LATICACIDVEN in the last 168 hours.  Recent Results (from the past 240 hour(s))  SARS Coronavirus 2 [facility] order, Performed in [place] hospital lab) Nasopharyngeal Nasopharyngeal Swab     Status: None   Collection Time: 06/28/19  3:40 PM   Specimen: Nasopharyngeal Swab  Result Value Ref Range Status   SARS Coronavirus 2 NEGATIVE NEGATIVE Final    Comment: (NOTE) If result is NEGATIVE SARS-CoV-2 target nucleic acids are NOT DETECTED. The SARS-CoV-2 RNA is generally detectable in upper and lower  respiratory specimens during the acute phase of infection. The lowest  concentration of SARS-CoV-2 viral copies this assay can detect is 250  copies / mL. A negative result does not preclude SARS-CoV-2 infection  and should not be used as the sole basis for treatment or other  patient management decisions.  A negative result may occur with  improper specimen collection / handling, submission of specimen other  than nasopharyngeal swab, presence of viral mutation(s) within the  areas targeted by this assay, and inadequate number of viral copies  (<250 copies / mL). A negative result must be combined with clinical  observations, patient history, and epidemiological information. If result is  POSITIVE SARS-CoV-2 target nucleic acids are DETECTED. The SARS-CoV-2 RNA is generally detectable in upper and lower  respiratory specimens dur ing the acute phase of infection.  Positive  results are indicative of active infection with SARS-CoV-2.  Clinical  correlation with patient history and other diagnostic information is  necessary to determine patient infection status.  Positive results do  not rule out bacterial infection or co-infection with other viruses. If result is PRESUMPTIVE POSTIVE SARS-CoV-2 nucleic acids MAY BE PRESENT.   A presumptive positive result was obtained on the submitted specimen  and confirmed on repeat testing.  While 2019 novel coronavirus  (SARS-CoV-2) nucleic acids may be present in the submitted sample  additional confirmatory testing may be necessary for epidemiological  and / or clinical management purposes  to differentiate between  SARS-CoV-2 and other Sarbecovirus currently known to infect humans.  If clinically indicated additional testing with an alternate test  methodology [phone]) is advised. The SARS-CoV-2 RNA is generally  detectable in upper and lower respiratory sp  ecimens during the acute  phase of infection. The expected result is Negative. Fact Sheet for Patients:  StrictlyIdeas.no Fact Sheet for Healthcare Providers: BankingDealers.co.za This test is not yet approved or cleared by the [place] FDA and has been authorized for detection and/or diagnosis of SARS-CoV-2 by FDA under an Emergency Use Authorization (EUA).  This EUA will remain in effect (meaning this test can be used) for the duration of the COVID-19 declaration under Section 564(b)(1) of the Act, 21 U.S.C. section 360bbb-3(b)(1), unless the authorization is terminated or revoked sooner. Performed at [facility], [address] [place], [place] [address]   Surgical pcr screen     Status: Abnormal   Collection Time:  06/28/19 10:45 PM   Specimen: Nasal Mucosa; Nasal Swab  Result Value Ref Range Status   MRSA, PCR NEGATIVE NEGATIVE Final   Staphylococcus aureus POSITIVE (A) NEGATIVE Final    Comment: (NOTE) The Xpert SA Assay (FDA approved for NASAL specimens in patients 30 years of age and older), is one component of a comprehensive surveillance program. It is not intended to diagnose infection nor to guide or monitor treatment. Performed at [facility], [place] [address], [place],  [address]          Radiology Studies: Dg Lumbar Spine 2-3 Views  Result Date: 06/28/2019 CLINICAL DATA:  Recent fall with low back pain, initial encounter EXAM: LUMBAR SPINE - 2-3 VIEW COMPARISON:  CT dated 02/26/2018 FINDINGS: There are changes consistent with prior vertebral augmentation at T12. Underlying compression deformities at T11 and L1 are noted. The T11 fracture is new from the prior CT examination. The L1 fracture is chronic. Degenerative changes are noted at L3-4 somewhat progressed from the prior CT examination. No other acute abnormality is noted. Diffuse aortic calcifications are seen. IMPRESSION: Changes of prior vertebral augmentation at T12. Interval fracture at T11 when compared with the prior CT examination. This is of uncertain chronicity however. Electronically Signed   By: Inez Catalina M.D.   On: 06/28/2019 14:51   Ct Head Wo Contrast  Result Date: 06/28/2019 CLINICAL DATA:  Fall. EXAM: CT HEAD WITHOUT CONTRAST TECHNIQUE: Contiguous axial images were obtained from the base of the skull through the vertex without intravenous contrast. COMPARISON:  07/14/2016 FINDINGS: Brain: There is no evidence of acute infarct, intracranial hemorrhage, mass, midline shift, or extra-axial fluid collection. Generalized cerebral atrophy is not greater than expected for age. Patchy hypodensities involving the left greater than right cerebral hemispheric white matter have at most mildly progressed and are  nonspecific but compatible with mild chronic small vessel ischemic disease. Vascular: Calcified atherosclerosis at the skull base. No hyperdense vessel. Skull: No fracture or focal osseous lesion. Sinuses/Orbits: Visualized paranasal sinuses and mastoid air cells are clear bilateral cataract extraction is noted. Other: None. IMPRESSION: 1. No evidence of acute intracranial abnormality. 2. Mild chronic small vessel ischemic disease. Electronically Signed   By: Logan Bores M.D.   On: 06/28/2019 18:29   Dg Chest Port 1 View  Result Date: 06/28/2019 CLINICAL DATA:  Known left hip fracture EXAM: PORTABLE CHEST 1 VIEW COMPARISON:  03/17/2019 FINDINGS: Cardiac shadow is stable. Patient is rotated significantly to the right accentuating the mediastinal markings. Aortic calcifications are seen. No acute bony abnormality is noted. Lungs are clear bilaterally. IMPRESSION: No acute abnormality noted. Electronically Signed   By: Inez Catalina M.D.   On: 06/28/2019 14:49   Dg Hip Unilat W Or W/o Pelvis 2-3 Views Left  Result Date: 06/28/2019  CLINICAL DATA:  Hip pain after recent fall EXAM: DG HIP (WITH OR WITHOUT PELVIS) 2-3V LEFT; DG HIP (WITH OR WITHOUT PELVIS) 2-3V RIGHT COMPARISON:  06/15/2019 FINDINGS: There is an acute fracture of the left femoral neck, which appears predominantly subcapital. There is at least 2.5 cm of foreshortening and 1.5 cm of lateral displacement. Left femoroacetabular joint remains aligned without dislocation. Chronic deformity of the proximal right femur status post ORIF with intact hardware. The osseous structures are diffusely demineralized. No additional acute fractures are identified. SI joints and pubic symphysis are intact with mild degenerative changes. Advanced aortoiliac atherosclerotic calcifications. IMPRESSION: Acute, moderately displaced, foreshortened fracture of the left femoral neck. These results will be called to the ordering clinician or representative by the Radiologist  Assistant, and communication documented in the PACS or zVision Dashboard. Electronically Signed   By: Davina Poke M.D.   On: 06/28/2019 11:46   Dg Hip Unilat W Or W/o Pelvis 2-3 Views Right  Result Date: 06/28/2019 CLINICAL DATA:  Hip pain after recent fall EXAM: DG HIP (WITH OR WITHOUT PELVIS) 2-3V LEFT; DG HIP (WITH OR WITHOUT PELVIS) 2-3V RIGHT COMPARISON:  06/15/2019 FINDINGS: There is an acute fracture of the left femoral neck, which appears predominantly subcapital. There is at least 2.5 cm of foreshortening and 1.5 cm of lateral displacement. Left femoroacetabular joint remains aligned without dislocation. Chronic deformity of the proximal right femur status post ORIF with intact hardware. The osseous structures are diffusely demineralized. No additional acute fractures are identified. SI joints and pubic symphysis are intact with mild degenerative changes. Advanced aortoiliac atherosclerotic calcifications. IMPRESSION: Acute, moderately displaced, foreshortened fracture of the left femoral neck. These results will be called to the ordering clinician or representative by the Radiologist Assistant, and communication documented in the PACS or zVision Dashboard. Electronically Signed   By: Davina Poke M.D.   On: 06/28/2019 11:46        Scheduled Meds:  docusate sodium  100 mg Oral BID   lubiprostone  24 mcg Oral BID   metoprolol tartrate  75 mg Oral BID   mupirocin ointment  1 application Topical BID   mupirocin ointment  1 application Topical Daily   pantoprazole  20 mg Oral Daily   Continuous Infusions:  sodium chloride 75 mL/hr at 06/29/19 1159   diltiazem (CARDIZEM) infusion 15 mg/hr (06/29/19 1127)     LOS: 1 day    Time spent: 55min    Giulianna Rocha, MD [facility] Pager [phone]  If 7PM-7AM, please contact night-coverage [email or web address] Password [facility] 06/29/2019, 4:42 PM

## 2019-06-29 NOTE — Progress Notes (Signed)
Initial Nutrition Assessment  RD working remotely.  DOCUMENTATION CODES:   Underweight  INTERVENTION:   -Downgrade diet to dysphagia 3 diet, for ease of intake -Ensure Enlive po BID, each supplement provides 350 kcal and 20 grams of protein -MVI with minerals daily -Magic cup TID with meals, each supplement provides 290 kcal and 9 grams of protein  NUTRITION DIAGNOSIS:   Increased nutrient needs related to wound healing as evidenced by estimated needs.  GOAL:   Patient will meet greater than or equal to 90% of their needs  MONITOR:   Diet advancement, Supplement acceptance, PO intake, Labs, Weight trends, Skin, I & O's  REASON FOR ASSESSMENT:   Other (Comment)    ASSESSMENT:   Betty Carpenter is a 85 y.o. female with medical history significant for breast cancer with right mastectomy, hypertension, who was brought to the ED with reports of a fall 4 days ago, 8/7.  Daughter was present at bedside.  Patient cannot remember details of the fall, but reports she fell backwards onto her buttock/lower back.  She denies hitting her head.  At baseline she ambulates with a wheelchair.  She got herself up onto her wheelchair, but then has not been able to transfer from bed to chair as she usually does.  She denies chest pain or difficulty breathing.  No history of irregular heart rhythm.  Pt admitted with lt femoral neck fracture s/p fall.   Reviewed I/O's: +1.6 L x 24 hours   UOP: 500 ml x 24 hours  Reviewed CWOCN note from 06/29/19; pt with chronic full thickness wounds on rt outer ankle and lt inner heel as well as callous on lt anterior plantar foot.   Per orthopedics notes, plan for partial hip replacement later on today. Cardiology has cleared pt for surgery.   Wt has been stable over the past 3 months.   RD suspect malnutrition given advanced age, multiple areas of skin breakdown, and underweight status. However, unable to identify at this time without completion of  nutrition-focused physical exam. Pt would greatly benefit from oral nutrition supplements.   Medications reviewed and include 0.9% NaCl @ 75 ml/hr.   Labs reviewed: CBGS: 116.   Diet Order:   Diet Order            DIET SOFT Room service appropriate? Yes; Fluid consistency: Thin  Diet effective now              EDUCATION NEEDS:   No education needs have been identified at this time  Skin:  Skin Assessment: Skin Integrity Issues: Skin Integrity Issues:: Stage II, Other (Comment) Stage II: coccyx Other: callous to lt anterior plantar foot; chronic full thickness wounds to rt outer ankle and lt inner heel  Last BM:  Unknown  Height:   Ht Readings from Last 1 Encounters:  06/28/19 5\' 11"  (1.803 m)    Weight:   Wt Readings from Last 1 Encounters:  06/28/19 56 kg    Ideal Body Weight:  70.5 kg  BMI:  Body mass index is 17.22 kg/m.  Estimated Nutritional Needs:   Kcal:  1600-1800  Protein:  75-90 grams  Fluid:  > 1.6 L    Monty Mccarrell A. Jimmye Norman, RD, LDN, [place] Registered Dietitian II Certified Diabetes Care and Education Specialist Pager: [phone] After hours Pager: [phone]

## 2019-06-29 NOTE — CV Procedure (Signed)
Echo attempted but HR too high. Will try again when HR in normal range.

## 2019-06-30 LAB — CBC
HCT: 35.7 % — ABNORMAL LOW (ref 36.0–46.0)
Hemoglobin: 11.4 g/dL — ABNORMAL LOW (ref 12.0–15.0)
MCH: 29.3 pg (ref 26.0–34.0)
MCHC: 31.9 g/dL (ref 30.0–36.0)
MCV: 91.8 fL (ref 80.0–100.0)
Platelets: 318 10*3/uL (ref 150–400)
RBC: 3.89 MIL/uL (ref 3.87–5.11)
RDW: 14 % (ref 11.5–15.5)
WBC: 11.8 10*3/uL — ABNORMAL HIGH (ref 4.0–10.5)
nRBC: 0 % (ref 0.0–0.2)

## 2019-06-30 LAB — BASIC METABOLIC PANEL
Anion gap: 12 (ref 5–15)
BUN: 19 mg/dL (ref 8–23)
CO2: 22 mmol/L (ref 22–32)
Calcium: 9.1 mg/dL (ref 8.9–10.3)
Chloride: 106 mmol/L (ref 98–111)
Creatinine, Ser: 0.61 mg/dL (ref 0.44–1.00)
GFR calc Af Amer: >60 mL/min (ref >60)
GFR calc non Af Amer: >60 mL/min (ref >60)
Glucose, Bld: 147 mg/dL — ABNORMAL HIGH (ref 70–99)
Potassium: 4.3 mmol/L (ref 3.5–5.1)
Sodium: 140 mmol/L (ref 135–145)

## 2019-06-30 MED ORDER — METOPROLOL TARTRATE 5 MG/5ML IV SOLN: 5.0000 mg | INTRAVENOUS | Status: DC | PRN

## 2019-06-30 MED ORDER — DILTIAZEM HCL 25 MG/5ML IV SOLN
10.0000 mg | Freq: Once | INTRAVENOUS | Status: AC
  Administered 2019-06-30: 10 mg via INTRAVENOUS
  Filled 2019-06-30: qty 5

## 2019-06-30 MED ORDER — DILTIAZEM HCL ER COATED BEADS 180 MG PO CP24
180.0000 mg | ORAL_CAPSULE | Freq: Every day | ORAL | Status: DC
  Administered 2019-06-30 – 2019-07-01 (×2): 180 mg via ORAL
  Filled 2019-06-30 (×2): qty 1

## 2019-06-30 MED ORDER — ENSURE ENLIVE PO LIQD
237.0000 mL | Freq: Three times a day (TID) | ORAL | Status: DC
  Administered 2019-06-30 – 2019-07-11 (×26): 237 mL via ORAL
  Filled 2019-06-30: qty 237

## 2019-06-30 NOTE — Progress Notes (Signed)
Patients daughter is taking the patients three rings home.

## 2019-06-30 NOTE — Evaluation (Signed)
Physical Therapy Evaluation Patient Details Name: Betty Carpenter MRN: [id number] DOB: 06/05/1925 Today's Date: 06/30/2019   History of Present Illness  85 y.o. female with medical history significant for breast cancer with right mastectomy, hypertension, R hip fx and surgery 03/17/19 who was brought to the ED 8/11 with reports of a fall 8/7. X-ray revealed L femoral hip fx pt also with new onset of A fib with RVR transferred to [facility] for treatment. L anterior approach hemi hip arthroplasty 06/29/19.    Clinical Impression  PTA pt resident at [facility], using a RW in her room for mobility and w/c when out of the room. Pt with fall and R hip fx 02/2019. Pt is currently oriented to self. Pt is limited in her safe mobility, by L hip pain and R ankle pain as well as decreased cognition, in the presence of general deconditioning. Pt is currently total A for all mobility due to her hip pain. PT recommending return to SNF at d/c. PT will continue to follow acutely.     Follow Up Recommendations SNF;Supervision/Assistance - 24 hour    Equipment Recommendations  None recommended by PT       Precautions / Restrictions Precautions Precautions: Fall Precaution Comments: hip fx result of fall  Restrictions LLE Weight Bearing: Weight bearing as tolerated      Mobility  Bed Mobility Overal bed mobility: Needs Assistance Bed Mobility: Supine to Sit;Sit to Supine     Supine to sit: Total assist Sit to supine: Total assist   General bed mobility comments: total assit to sit EoB and to return to bed  Transfers Overall transfer level: Needs assistance Equipment used: 2 person hand held assist Transfers: Sit to/from Stand Sit to Stand: Total assist;+2 physical assistance;From elevated surface         General transfer comment: total A with 2x attempt for sit>stand pt with increased R lateral lean to keep L LE from weightbearing due to pain      Balance Overall balance  assessment: Needs assistance Sitting-balance support: Feet supported;Single extremity supported;Bilateral upper extremity supported Sitting balance-Leahy Scale: Poor Sitting balance - Comments: able to progress to min guard for balance from initial max A to maintain balance.                                      Pertinent Vitals/Pain Pain Assessment: Faces Faces Pain Scale: Hurts whole lot Pain Location: R ankle and L hip  Pain Descriptors / Indicators: Grimacing;Guarding;Operative site guarding Pain Intervention(s): Limited activity within patient's tolerance;Monitored during session;Repositioned;Patient requesting pain meds-RN notified    Home Living Family/patient expects to be discharged to:: Skilled nursing facility                 Additional Comments: resident from [place]    Prior Function Level of Independence: Needs assistance   Gait / Transfers Assistance Needed: per chart pt uses RW to ambulate in room and w/c for mobility out of room              Extremity/Trunk Assessment   Upper Extremity Assessment Upper Extremity Assessment: Defer to OT evaluation    Lower Extremity Assessment Lower Extremity Assessment: RLE deficits/detail;LLE deficits/detail RLE Deficits / Details: foam dressing over painful R ankle wound RLE: Unable to fully assess due to pain LLE Deficits / Details: L LE very painful with all movement  Communication   Communication: HOH  Cognition Arousal/Alertness: Awake/alert Behavior During Therapy: WFL for tasks assessed/performed Overall Cognitive Status: History of cognitive impairments - at baseline                                 General Comments: Thinks it's 20 years earlier and that she is staying with friends until her husband come home from work at the [employer], oriented to self only,       General Comments General comments (skin integrity, edema, etc.): Pt in Afib with HR  range 98-123bpm, SaO2 >90%O2 on 2L O2 via [place]        Assessment/Plan    PT Assessment Patient needs continued PT services  PT Problem List Decreased activity tolerance;Decreased balance;Decreased mobility;Decreased cognition;Cardiopulmonary status limiting activity;Pain;Decreased skin integrity       PT Treatment Interventions DME instruction;Gait training;Functional mobility training;Therapeutic activities;Therapeutic exercise;Balance training;Cognitive remediation;Patient/family education    PT Goals (Current goals can be found in the Care Plan section)  Acute Rehab PT Goals Patient Stated Goal: lay back down PT Goal Formulation: Patient unable to participate in goal setting Time For Goal Achievement: 07/14/19 Potential to Achieve Goals: Fair    Frequency Min 2X/week    AM-PAC PT "6 Clicks" Mobility  Outcome Measure Help needed turning from your back to your side while in a flat bed without using bedrails?: A Lot Help needed moving from lying on your back to sitting on the side of a flat bed without using bedrails?: Total Help needed moving to and from a bed to a chair (including a wheelchair)?: Total Help needed standing up from a chair using your arms (e.g., wheelchair or bedside chair)?: Total Help needed to walk in hospital room?: Total Help needed climbing 3-5 steps with a railing? : Total 6 Click Score: 7    End of Session Equipment Utilized During Treatment: Oxygen Activity Tolerance: Patient limited by pain Patient left: with call bell/phone within reach;in bed;with bed alarm set;with SCD's reapplied Nurse Communication: Mobility status;Patient requests pain meds PT Visit Diagnosis: Unsteadiness on feet (R26.81);Repeated falls (R29.6);History of falling (Z91.81);Difficulty in walking, not elsewhere classified (R26.2);Pain;Muscle weakness (generalized) (M62.81);Other abnormalities of gait and mobility (R26.89) Pain - Right/Left: Left Pain - part of body: Hip     Time: 1339-1400 PT Time Calculation (min) (ACUTE ONLY): 21 min   Charges:   PT Evaluation $PT Eval Moderate Complexity: 1 Mod          Betty Carpenter B. Migdalia Dk PT, DPT Acute Rehabilitation Services Pager [phone] Office [phone]   [place] 06/30/2019, 2:33 PM

## 2019-06-30 NOTE — Progress Notes (Signed)
PROGRESS NOTE    Betty Carpenter  UXN:[id number] DOB: 27-Apr-1925 DOA: 06/28/2019 PCP: Terald Sleeper, PA-C   Brief Narrative:  85 year old female with history of her with right mastectomy, hypertension was brought to the emergency department reporting that had a fall about 4 days ago.  Patient could not remember the details of the fall.  Per daughter patient fell backwards into her buttock.  At baseline patient walks with the help of the walker.  X-ray of the hip revealed left femoral neck fracture.  Orthopedic surgery is consulted.  Patient received 7 mg of morphine with oxygen saturations dropped to 80s.  Patient was placed on nonrebreather and was given Narcan.  In the emergency department patient was found to have atrial fibrillation with rapid ventricular rate of 160 -170.  Patient does not have any previous history of atrial fibrillation.  TSH 1.2.  COVID-19 test came back to be negative.  Initial set of cardiac enzymes were negative.  Orthopedic surgery is consulted, underwent hemiarthroplasty.  Assessment & Plan:  ##Left femoral neck fracture -Patient underwent hemiarthroplasty on 06/29/2019 -Continue with pain management as needed -Physical therapy evaluation -DVT prophylaxis with Eliquis  ##New onset atrial fibrillation with rapid ventricular rate -Consulted cardiology -Troponins x3 came back to be negative -Echocardiogram showed preserved EF -Currently on Cardizem drip, also increase the dose of metoprolol 100 mg twice daily, rate controlled. -Slowly wean off Cardizem drip. -Patient has chads vasc score of 4, started on Eliquis 2.5 mg twice daily.  ##Acute blood loss anemia -Hemoglobin trended down to 11.4 -Closely follow-up  ##Dementia -Do not have patient's baseline   Principal Problem:   Closed displaced fracture of left femoral neck (HCC) Active Problems:   Atrial fibrillation with rapid ventricular response (HCC)      DVT prophylaxis: Lovenox when cleared by  orthopedic surgery Code Status: (Full/.   Family Communication: Patient's daughter disposition Plan: Based on PT evaluation   Consultants:   Orthopedic surgery  Cardiology  Procedures: Left hip hemiarthroplasty on 06/29/2019  Subjective: Complaining of pain in the left hip  Objective: Vitals:   06/30/19 0410 06/30/19 0414 06/30/19 0823 06/30/19 1202  BP:  (!) 138/92 (!) 141/76   Pulse:      Resp:  16    Temp: 97.8 F (36.6 C) 97.8 F (36.6 C) 98.9 F (37.2 C) 97.6 F (36.4 C)  TempSrc: Oral Oral Axillary Oral  SpO2:  99%    Weight:      Height:        Intake/Output Summary (Last 24 hours) at 06/30/2019 1306 Last data filed at 06/30/2019 0700 Gross per 24 hour  Intake 1357.07 ml  Output 1875 ml  Net -517.93 ml   Filed Weights   06/28/19 1317 06/29/19 1743 06/30/19 0344  Weight: 56 kg 56 kg 55 kg    Examination:  General exam: Mild distress from pain Respiratory system: Clear to auscultation. Respiratory effort normal. Cardiovascular system: S1 & S2 heard, iRRR. No JVD, murmurs, rubs, gallops or clicks. No pedal edema. Gastrointestinal system: Abdomen is nondistended, soft and nontender. No organomegaly or masses felt. Normal bowel sounds heard. Central nervous system: Alert and oriented. No focal neurological deficits. Extremities: Significant pain in the left hip Skin: No rashes, lesions or ulcers Psychiatry: Poor judgment   Data Reviewed: I have personally reviewed following labs and imaging studies  CBC: Recent Labs  Lab 06/28/19 1432 06/29/19 0218 06/29/19 2203 06/30/19 0257  WBC 11.8* 11.3* 14.5* 11.8*  NEUTROABS 10.2*  --   --   --  HGB 13.0 12.0 12.2 11.4*  HCT 41.3 37.7 37.9 35.7*  MCV 92.0 91.5 91.5 91.8  PLT 341 306 299 678   Basic Metabolic Panel: Recent Labs  Lab 06/28/19 1432 06/28/19 2018 06/29/19 0218 06/29/19 2203 06/30/19 0257  NA 139  --  142  --  140  K 3.6  --  4.3  --  4.3  CL 102  --  107  --  106  CO2 24  --  22   --  22  GLUCOSE 124*  --  110*  --  147*  BUN 59*  --  37*  --  19  CREATININE 1.03*  --  0.76 0.61 0.61  CALCIUM 9.4  --  9.3  --  9.1  MG  --  2.3  --   --   --    GFR: Estimated Creatinine Clearance: 39 mL/min (by C-G formula based on SCr of 0.61 mg/dL). Liver Function Tests: No results for input(s): AST, ALT, ALKPHOS, BILITOT, PROT, ALBUMIN in the last 168 hours. No results for input(s): LIPASE, AMYLASE in the last 168 hours. No results for input(s): AMMONIA in the last 168 hours. Coagulation Profile: No results for input(s): INR, PROTIME in the last 168 hours. Cardiac Enzymes: No results for input(s): CKTOTAL, CKMB, CKMBINDEX, TROPONINI in the last 168 hours. BNP (last 3 results) No results for input(s): PROBNP in the last 8760 hours. HbA1C: No results for input(s): HGBA1C in the last 72 hours. CBG: Recent Labs  Lab 06/29/19 1132  GLUCAP 116*   Lipid Profile: No results for input(s): CHOL, HDL, LDLCALC, TRIG, CHOLHDL, LDLDIRECT in the last 72 hours. Thyroid Function Tests: Recent Labs    06/28/19 1432  TSH 1.230   Anemia Panel: No results for input(s): VITAMINB12, FOLATE, FERRITIN, TIBC, IRON, RETICCTPCT in the last 72 hours. Sepsis Labs: No results for input(s): PROCALCITON, LATICACIDVEN in the last 168 hours.  Recent Results (from the past 240 hour(s))  SARS Coronavirus 2 [facility] order, Performed in [facility] hospital lab) Nasopharyngeal Nasopharyngeal Swab     Status: None   Collection Time: 06/28/19  3:40 PM   Specimen: Nasopharyngeal Swab  Result Value Ref Range Status   SARS Coronavirus 2 NEGATIVE NEGATIVE Final    Comment: (NOTE) If result is NEGATIVE SARS-CoV-2 target nucleic acids are NOT DETECTED. The SARS-CoV-2 RNA is generally detectable in upper and lower  respiratory specimens during the acute phase of infection. The lowest  concentration of SARS-CoV-2 viral copies this assay can detect is 250  copies / mL. A negative result does not  preclude SARS-CoV-2 infection  and should not be used as the sole basis for treatment or other  patient management decisions.  A negative result may occur with  improper specimen collection / handling, submission of specimen other  than nasopharyngeal swab, presence of viral mutation(s) within the  areas targeted by this assay, and inadequate number of viral copies  (<250 copies / mL). A negative result must be combined with clinical  observations, patient history, and epidemiological information. If result is POSITIVE SARS-CoV-2 target nucleic acids are DETECTED. The SARS-CoV-2 RNA is generally detectable in upper and lower  respiratory specimens dur ing the acute phase of infection.  Positive  results are indicative of active infection with SARS-CoV-2.  Clinical  correlation with patient history and other diagnostic information is  necessary to determine patient infection status.  Positive results do  not rule out bacterial infection or co-infection with other viruses. If result is  PRESUMPTIVE POSTIVE SARS-CoV-2 nucleic acids MAY BE PRESENT.   A presumptive positive result was obtained on the submitted specimen  and confirmed on repeat testing.  While 2019 novel coronavirus  (SARS-CoV-2) nucleic acids may be present in the submitted sample  additional confirmatory testing may be necessary for epidemiological  and / or clinical management purposes  to differentiate between  SARS-CoV-2 and other Sarbecovirus currently known to infect humans.  If clinically indicated additional testing with an alternate test  methodology [phone]) is advised. The SARS-CoV-2 RNA is generally  detectable in upper and lower respiratory sp ecimens during the acute  phase of infection. The expected result is Negative. Fact Sheet for Patients:  StrictlyIdeas.no Fact Sheet for Healthcare Providers: BankingDealers.co.za This test is not yet approved or cleared by  the [place] FDA and has been authorized for detection and/or diagnosis of SARS-CoV-2 by FDA under an Emergency Use Authorization (EUA).  This EUA will remain in effect (meaning this test can be used) for the duration of the COVID-19 declaration under Section 564(b)(1) of the Act, 21 U.S.C. section 360bbb-3(b)(1), unless the authorization is terminated or revoked sooner. Performed at [facility], [address] [place], [place] [address]   Surgical pcr screen     Status: Abnormal   Collection Time: 06/28/19 10:45 PM   Specimen: Nasal Mucosa; Nasal Swab  Result Value Ref Range Status   MRSA, PCR NEGATIVE NEGATIVE Final   Staphylococcus aureus POSITIVE (A) NEGATIVE Final    Comment: (NOTE) The Xpert SA Assay (FDA approved for NASAL specimens in patients 29 years of age and older), is one component of a comprehensive surveillance program. It is not intended to diagnose infection nor to guide or monitor treatment. Performed at [facility], [address] [place], [place] [address]          Radiology Studies: Ct Head Wo Contrast  Result Date: 06/28/2019 CLINICAL DATA:  Fall. EXAM: CT HEAD WITHOUT CONTRAST TECHNIQUE: Contiguous axial images were obtained from the base of the skull through the vertex without intravenous contrast. COMPARISON:  07/14/2016 FINDINGS: Brain: There is no evidence of acute infarct, intracranial hemorrhage, mass, midline shift, or extra-axial fluid collection. Generalized cerebral atrophy is not greater than expected for age. Patchy hypodensities involving the left greater than right cerebral hemispheric white matter have at most mildly progressed and are nonspecific but compatible with mild chronic small vessel ischemic disease. Vascular: Calcified atherosclerosis at the skull base. No hyperdense vessel. Skull: No fracture or focal osseous lesion. Sinuses/Orbits: Visualized paranasal sinuses and mastoid air cells are clear bilateral cataract  extraction is noted. Other: None. IMPRESSION: 1. No evidence of acute intracranial abnormality. 2. Mild chronic small vessel ischemic disease. Electronically Signed   By: Logan Bores M.D.   On: 06/28/2019 18:29   Pelvis Portable  Result Date: 06/29/2019 CLINICAL DATA:  Postoperative radiograph anterior approach left hip arthroplasty EXAM: PORTABLE PELVIS 1-2 VIEWS COMPARISON:  Intraoperative fluoroscopy same day, radiographs June 28, 2019 FINDINGS: Interval placement of left hip hemiarthroplasty with expected alignment of the femoral articular component and left acetabulum. There are expected postsurgical soft tissue changes including gas within the joint. Transcervical fixation screws are present in proximal right femur with bony remodeling of the right femoral head which may suggest prior osteonecrosis. Vascular calcium is noted in the soft tissues. Foley catheter is in position. IMPRESSION: Interval left hip hemiarthroplasty without evidence of acute postoperative complication. Electronically Signed   By: Lovena Le M.D.   On: 06/29/2019  19:48   Dg Chest Port 1 View  Result Date: 06/28/2019 CLINICAL DATA:  Known left hip fracture EXAM: PORTABLE CHEST 1 VIEW COMPARISON:  03/17/2019 FINDINGS: Cardiac shadow is stable. Patient is rotated significantly to the right accentuating the mediastinal markings. Aortic calcifications are seen. No acute bony abnormality is noted. Lungs are clear bilaterally. IMPRESSION: No acute abnormality noted. Electronically Signed   By: Inez Catalina M.D.   On: 06/28/2019 14:49   Dg C-arm 1-60 Min  Result Date: 06/29/2019 CLINICAL DATA:  Left hip hemiarthroplasty EXAM: DG C-ARM 61-120 MIN; OPERATIVE LEFT HIP WITH PELVIS COMPARISON:  06/28/2019 FINDINGS: Three low resolution intraoperative spot views of the left hip. Total fluoroscopy time was 6 seconds. Left femoral neck fracture. Subsequent placement of left hip replacement with normal alignment. Previous screw fixation  of the right femur. IMPRESSION: Intraoperative fluoroscopic assistance provided during left hip replacement Electronically Signed   By: Donavan Foil M.D.   On: 06/29/2019 19:32   Dg Hip Operative Unilat W Or W/o Pelvis Left  Result Date: 06/29/2019 CLINICAL DATA:  Left hip hemiarthroplasty EXAM: DG C-ARM 61-120 MIN; OPERATIVE LEFT HIP WITH PELVIS COMPARISON:  06/28/2019 FINDINGS: Three low resolution intraoperative spot views of the left hip. Total fluoroscopy time was 6 seconds. Left femoral neck fracture. Subsequent placement of left hip replacement with normal alignment. Previous screw fixation of the right femur. IMPRESSION: Intraoperative fluoroscopic assistance provided during left hip replacement Electronically Signed   By: Donavan Foil M.D.   On: 06/29/2019 19:32        Scheduled Meds:  acetaminophen  500 mg Oral Q6H   diltiazem  180 mg Oral Daily   docusate sodium  100 mg Oral BID   enoxaparin (LOVENOX) injection  40 mg Subcutaneous Q24H   feeding supplement (ENSURE ENLIVE)  237 mL Oral BID BM   lubiprostone  24 mcg Oral BID   metoprolol tartrate  75 mg Oral BID   multivitamin with minerals  1 tablet Oral Daily   mupirocin ointment  1 application Topical BID   mupirocin ointment  1 application Topical Daily   pantoprazole  20 mg Oral Daily   Continuous Infusions:  sodium chloride 75 mL/hr at 06/29/19 1159   sodium chloride 75 mL/hr at 06/30/19 0500   lactated ringers 10 mL/hr at 06/29/19 1744   methocarbamol (ROBAXIN) IV       LOS: 2 days    Time spent: 51min    Brayln Duque, MD [facility] Pager [phone]  If 7PM-7AM, please contact night-coverage [email or web address] Password [email or web address] 06/30/2019, 1:06 PM

## 2019-06-30 NOTE — Evaluation (Signed)
Occupational Therapy Evaluation Patient Details Name: Betty Carpenter MRN: [id number] DOB: 1924/12/26 Today's Date: 06/30/2019    History of Present Illness 85 y.o. female with medical history significant for breast cancer with right mastectomy, hypertension, R hip fx and surgery 03/17/19 who was brought to the ED 8/11 with reports of a fall 8/7. X-ray revealed L femoral hip fx pt also with new onset of A fib with RVR transferred to [facility] for treatment. L anterior approach hemi hip arthroplasty 06/29/19.     Clinical Impression   Pt admitted with above. She demonstrates the below listed deficits and will benefit from continued OT to maximize safety and independence with BADLs.  Pt presents to OT with increased pain, impaired balance, decreased activity tolerance, generalized weakness, as well as h/o dementia.  She currently requires mod - total A for ADLs and total A +2 for attempts at standing.  Per chart, she is long term resident of SNF and was able to ambulate in her room with RW and able to transfer to w/c.  Recommend return to SNF.       Follow Up Recommendations  SNF    Equipment Recommendations  None recommended by OT    Recommendations for Other Services       Precautions / Restrictions Precautions Precautions: Fall Precaution Comments: hip fx result of fall  Restrictions LLE Weight Bearing: Weight bearing as tolerated      Mobility Bed Mobility Overal bed mobility: Needs Assistance Bed Mobility: Supine to Sit;Sit to Supine     Supine to sit: Total assist Sit to supine: Total assist   General bed mobility comments: total assit to sit EoB and to return to bed  Transfers Overall transfer level: Needs assistance Equipment used: 2 person hand held assist Transfers: Sit to/from Stand Sit to Stand: Total assist;+2 physical assistance;From elevated surface         General transfer comment: total A with 2x attempt for sit>stand pt with increased R lateral lean to  keep L LE from weightbearing due to pain    Balance Overall balance assessment: Needs assistance Sitting-balance support: Feet supported;Single extremity supported;Bilateral upper extremity supported Sitting balance-Leahy Scale: Poor Sitting balance - Comments: able to progress to min guard for balance from initial max A to maintain balance.                                    ADL either performed or assessed with clinical judgement   ADL Overall ADL's : Needs assistance/impaired Eating/Feeding: Set up;Supervision/ safety;Bed level   Grooming: Wash/dry hands;Wash/dry face;Minimal assistance;Sitting   Upper Body Bathing: Maximal assistance;Sitting   Lower Body Bathing: Maximal assistance;Bed level   Upper Body Dressing : Maximal assistance;Sitting   Lower Body Dressing: Total assistance;Bed level   Toilet Transfer: Total assistance;+2 for safety/equipment;+2 for physical assistance Toilet Transfer Details (indicate cue type and reason): unable to complete          Functional mobility during ADLs: +2 for physical assistance;+2 for safety/equipment;Total assistance       Vision         Perception     Praxis      Pertinent Vitals/Pain Pain Assessment: Faces Faces Pain Scale: Hurts whole lot Pain Location: R ankle and L hip  Pain Descriptors / Indicators: Grimacing;Guarding;Operative site guarding Pain Intervention(s): Monitored during session;Limited activity within patient's tolerance;Repositioned     Hand Dominance  Extremity/Trunk Assessment Upper Extremity Assessment Upper Extremity Assessment: Generalized weakness   Lower Extremity Assessment Lower Extremity Assessment: Defer to PT evaluation RLE Deficits / Details: foam dressing over painful R ankle wound RLE: Unable to fully assess due to pain LLE Deficits / Details: L LE very painful with all movement   Cervical / Trunk Assessment Cervical / Trunk Assessment: Kyphotic    Communication Communication Communication: HOH   Cognition Arousal/Alertness: Awake/alert Behavior During Therapy: WFL for tasks assessed/performed Overall Cognitive Status: History of cognitive impairments - at baseline                                 General Comments: Thinks it's 20 years earlier and that she is staying with friends until her husband come home from work at the [employer], oriented to self only,    General Comments  Pt in Afib with HR range 98-123bpm, SaO2 >90%O2 on 2L O2 via     Exercises     Shoulder Instructions      Home Living Family/patient expects to be discharged to:: Skilled nursing facility                                 Additional Comments: resident from [place]      Prior Functioning/Environment Level of Independence: Needs assistance  Gait / Transfers Assistance Needed: per chart pt uses RW to ambulate in room and w/c for mobility out of room ADL's / Homemaking Assistance Needed: Pt unable to provide info re: ADLs and no info in chart             OT Problem List: Decreased strength;Decreased activity tolerance;Impaired balance (sitting and/or standing);Decreased safety awareness;Decreased knowledge of use of DME or AE;Pain      OT Treatment/Interventions: Self-care/ADL training;DME and/or AE instruction;Therapeutic activities;Patient/family education;Balance training;Cognitive remediation/compensation    OT Goals(Current goals can be found in the care plan section) Acute Rehab OT Goals Patient Stated Goal: lay back down OT Goal Formulation: Patient unable to participate in goal setting Time For Goal Achievement: 07/14/19 Potential to Achieve Goals: Good ADL Goals Pt Will Perform Grooming: with supervision;sitting Pt Will Transfer to Toilet: with mod assist;stand pivot transfer;bedside commode Pt Will Perform Toileting - Clothing Manipulation and hygiene: with max assist;sit to/from stand   OT Frequency: Min 2X/week   Barriers to D/C:            Co-evaluation PT/OT/SLP Co-Evaluation/Treatment: Yes Reason for Co-Treatment: Complexity of the patient's impairments (multi-system involvement);For patient/therapist safety;Necessary to address cognition/behavior during functional activity   OT goals addressed during session: Strengthening/ROM      AM-PAC OT "6 Clicks" Daily Activity     Outcome Measure Help from another person eating meals?: None Help from another person taking care of personal grooming?: A Lot Help from another person toileting, which includes using toliet, bedpan, or urinal?: Total Help from another person bathing (including washing, rinsing, drying)?: A Lot Help from another person to put on and taking off regular upper body clothing?: A Lot Help from another person to put on and taking off regular lower body clothing?: Total 6 Click Score: 12   End of Session Equipment Utilized During Treatment: Gait belt Nurse Communication: Mobility status  Activity Tolerance: Patient limited by pain Patient left: in bed;with call bell/phone within reach;with bed alarm set  OT Visit Diagnosis: Pain Pain -  Right/Left: Left Pain - part of body: Hip                Time: 0165-8006 OT Time Calculation (min): 13 min Charges:  OT General Charges $OT Visit: 1 Visit OT Evaluation $OT Eval Moderate Complexity: 1 Mod  Lucille Passy, OTR/L [place] Pager [phone] Office [phone]   Lucille Passy M 06/30/2019, 2:53 PM

## 2019-06-30 NOTE — Progress Notes (Signed)
   Subjective:  Patient resting comfortably in bed.  Objective:   VITALS:   Vitals:   06/30/19 1202 06/30/19 1406 06/30/19 1545 06/30/19 1615  BP:  (!) 161/77 129/85 130/80  Pulse:    (!) 110  Resp:   19 (!) 23  Temp: 97.6 F (36.4 C)   97.9 F (36.6 C)  TempSrc: Oral   Oral  SpO2:   97% 98%  Weight:      Height:        Intact pulses distally Dorsiflexion/Plantar flexion intact Incision: dressing C/D/I and no drainage No cellulitis present Compartment soft   Lab Results  Component Value Date   WBC 11.8 (H) 06/30/2019   HGB 11.4 (L) 06/30/2019   HCT 35.7 (L) 06/30/2019   MCV 91.8 06/30/2019   PLT 318 06/30/2019     Assessment/Plan:  1 Day Post-Op   - Expected postop acute blood loss anemia - will monitor for symptoms - Up with PT/OT - DVT ppx - SCDs, ambulation, eliquis - WBAT operative extremity - SNF pending   Eduard Roux 06/30/2019, 6:13 PM [phone]

## 2019-06-30 NOTE — Progress Notes (Addendum)
Nutrition Follow-up  DOCUMENTATION CODES:   Non-severe (moderate) malnutrition in context of chronic illness, Underweight  INTERVENTION:   -Continue MVI with minerals daily -Increase Ensure Enlive po to TID, each supplement provides 350 kcal and 20 grams of protein -Continue Magic cup TID with meals, each supplement provides 290 kcal and 9 grams of protein  NUTRITION DIAGNOSIS:   Moderate Malnutrition related to chronic illness(dementia) as evidenced by mild fat depletion, moderate fat depletion, mild muscle depletion, moderate muscle depletion.  Ongoing  GOAL:   Patient will meet greater than or equal to 90% of their needs  Progressing   MONITOR:   PO intake, Supplement acceptance, Labs, Weight trends, Skin, I & O's  REASON FOR ASSESSMENT:   Other (Comment)    ASSESSMENT:   Betty Carpenter is a 85 y.o. female with medical history significant for breast cancer with right mastectomy, hypertension, who was brought to the ED with reports of a fall 4 days ago, 8/7.  Daughter was present at bedside.  Patient cannot remember details of the fall, but reports she fell backwards onto her buttock/lower back.  She denies hitting her head.  At baseline she ambulates with a wheelchair.  She got herself up onto her wheelchair, but then has not been able to transfer from bed to chair as she usually does.  She denies chest pain or difficulty breathing.  No history of irregular heart rhythm.  8/12- s/p Prosthetic replacement for femoral neck fracture  Reviewed I/O's: -737 ml x 24 hours and +891 ml snce admission  UOP: 2.3 L x 24 hours  Pt very drowsy at time of visit and complains of being in pain. She was not very talkative at time of visit. Observed breakfast tray and pt consumed only 50% of Ensure and a bite of Magic Cup.   Per wt hx, pt with wt stability over the past year, however, with distant wt loss in the past.   Medications reviewed and include 0.9% sodium chloride @ 75 ml/hr.    Labs reviewed.   NUTRITION - FOCUSED PHYSICAL EXAM:    Most Recent Value  Orbital Region  Moderate depletion  Upper Arm Region  Moderate depletion  Thoracic and Lumbar Region  Moderate depletion  Buccal Region  Moderate depletion  Temple Region  Moderate depletion  Clavicle Bone Region  Moderate depletion  Clavicle and Acromion Bone Region  Moderate depletion  Scapular Bone Region  Moderate depletion  Dorsal Hand  Moderate depletion  Patellar Region  Mild depletion  Anterior Thigh Region  Mild depletion  Posterior Calf Region  Mild depletion  Edema (RD Assessment)  Mild  Hair  Reviewed  Eyes  Reviewed  Mouth  Reviewed  Skin  Reviewed  Nails  Reviewed       Diet Order:   Diet Order            Diet Heart Room service appropriate? Yes; Fluid consistency: Thin  Diet effective now              EDUCATION NEEDS:   No education needs have been identified at this time  Skin:  Skin Assessment: Skin Integrity Issues: Skin Integrity Issues:: Stage II, Other (Comment) Stage II: coccyx Other: callous to lt anterior plantar foot; chronic full thickness wounds to rt outer ankle and lt inner heel  Last BM:  Unknown  Height:   Ht Readings from Last 1 Encounters:  06/29/19 5' 10.98" (1.803 m)    Weight:   Wt Readings from Last  1 Encounters:  06/30/19 55 kg    Ideal Body Weight:  70.5 kg  BMI:  Body mass index is 16.92 kg/m.  Estimated Nutritional Needs:   Kcal:  1600-1800  Protein:  75-90 grams  Fluid:  > 1.6 L    Lakeyta Vandenheuvel A. Jimmye Norman, RD, LDN, [place] Registered Dietitian II Certified Diabetes Care and Education Specialist Pager: [phone] After hours Pager: [phone]

## 2019-06-30 NOTE — Progress Notes (Signed)
Progress Note  Patient Name: Betty Carpenter Date of Encounter: 06/30/2019  Primary Cardiologist: Pixie Casino, MD   Subjective   No complaints this morning. Hip pain is well controlled following repair yesterday. No complaints of chest pain, SOB, or palpitations.   Inpatient Medications    Scheduled Meds: . acetaminophen  500 mg Oral Q6H  . docusate sodium  100 mg Oral BID  . enoxaparin (LOVENOX) injection  40 mg Subcutaneous Q24H  . feeding supplement (ENSURE ENLIVE)  237 mL Oral BID BM  . lubiprostone  24 mcg Oral BID  . metoprolol tartrate  75 mg Oral BID  . multivitamin with minerals  1 tablet Oral Daily  . mupirocin ointment  1 application Topical BID  . mupirocin ointment  1 application Topical Daily  . pantoprazole  20 mg Oral Daily   Continuous Infusions: . sodium chloride 75 mL/hr at 06/29/19 1159  . sodium chloride 75 mL/hr at 06/30/19 0500  .  ceFAZolin (ANCEF) IV 2 g (06/30/19 0651)  . diltiazem (CARDIZEM) infusion 15 mg/hr (06/30/19 0646)  . lactated ringers 10 mL/hr at 06/29/19 1744  . methocarbamol (ROBAXIN) IV     PRN Meds: acetaminophen **OR** acetaminophen, alum & mag hydroxide-simeth, HYDROcodone-acetaminophen, HYDROcodone-acetaminophen, HYDROmorphone (DILAUDID) injection, magnesium citrate, menthol-cetylpyridinium **OR** phenol, methocarbamol **OR** methocarbamol (ROBAXIN) IV, metoprolol tartrate, morphine injection, ondansetron **OR** ondansetron (ZOFRAN) IV, polyethylene glycol, sorbitol   Vital Signs    Vitals:   06/30/19 0344 06/30/19 0410 06/30/19 0414 06/30/19 0823  BP:   (!) 138/92 (!) 141/76  Pulse:      Resp:   16   Temp:  97.8 F (36.6 C) 97.8 F (36.6 C) 98.9 F (37.2 C)  TempSrc:  Oral Oral Axillary  SpO2:   99%   Weight: 55 kg     Height:        Intake/Output Summary (Last 24 hours) at 06/30/2019 1010 Last data filed at 06/30/2019 0700 Gross per 24 hour  Intake 1357.07 ml  Output 2375 ml  Net -1017.93 ml   Filed Weights   06/28/19 1317 06/29/19 1743 06/30/19 0344  Weight: 56 kg 56 kg 55 kg    Telemetry    Afib with rates in the 90s-100s with occasional PVCs - Personally Reviewed  Physical Exam   GEN: Sleeping upon entering the room, arouses easily, no acute distress.   Neck: No JVD, no carotid bruits Cardiac:  IRIR, +murmur, no rubs or gallops.  Respiratory: Clear to auscultation bilaterally, no wheezes/ rales/ rhonchi GI: NABS, Soft, nontender, non-distended  MS: No edema; No deformity. Neuro:  Nonfocal, moving all extremities spontaneously Psych: Normal affect   Labs    Chemistry Recent Labs  Lab 06/28/19 1432 06/29/19 0218 06/29/19 2203 06/30/19 0257  NA 139 142  --  140  K 3.6 4.3  --  4.3  CL 102 107  --  106  CO2 24 22  --  22  GLUCOSE 124* 110*  --  147*  BUN 59* 37*  --  19  CREATININE 1.03* 0.76 0.61 0.61  CALCIUM 9.4 9.3  --  9.1  GFRNONAA 47* >60 >60 >60  GFRAA 55* >60 >60 >60  ANIONGAP 13 13  --  12     Hematology Recent Labs  Lab 06/29/19 0218 06/29/19 2203 06/30/19 0257  WBC 11.3* 14.5* 11.8*  RBC 4.12 4.14 3.89  HGB 12.0 12.2 11.4*  HCT 37.7 37.9 35.7*  MCV 91.5 91.5 91.8  MCH 29.1 29.5 29.3  MCHC  31.8 32.2 31.9  RDW 14.4 14.1 14.0  PLT 306 299 318    Cardiac EnzymesNo results for input(s): TROPONINI in the last 168 hours. No results for input(s): TROPIPOC in the last 168 hours.   BNPNo results for input(s): BNP, PROBNP in the last 168 hours.   DDimer No results for input(s): DDIMER in the last 168 hours.   Radiology    Dg Lumbar Spine 2-3 Views  Result Date: 06/28/2019 CLINICAL DATA:  Recent fall with low back pain, initial encounter EXAM: LUMBAR SPINE - 2-3 VIEW COMPARISON:  CT dated 02/26/2018 FINDINGS: There are changes consistent with prior vertebral augmentation at T12. Underlying compression deformities at T11 and L1 are noted. The T11 fracture is new from the prior CT examination. The L1 fracture is chronic. Degenerative changes are noted at  L3-4 somewhat progressed from the prior CT examination. No other acute abnormality is noted. Diffuse aortic calcifications are seen. IMPRESSION: Changes of prior vertebral augmentation at T12. Interval fracture at T11 when compared with the prior CT examination. This is of uncertain chronicity however. Electronically Signed   By: Inez Catalina M.D.   On: 06/28/2019 14:51   Ct Head Wo Contrast  Result Date: 06/28/2019 CLINICAL DATA:  Fall. EXAM: CT HEAD WITHOUT CONTRAST TECHNIQUE: Contiguous axial images were obtained from the base of the skull through the vertex without intravenous contrast. COMPARISON:  07/14/2016 FINDINGS: Brain: There is no evidence of acute infarct, intracranial hemorrhage, mass, midline shift, or extra-axial fluid collection. Generalized cerebral atrophy is not greater than expected for age. Patchy hypodensities involving the left greater than right cerebral hemispheric white matter have at most mildly progressed and are nonspecific but compatible with mild chronic small vessel ischemic disease. Vascular: Calcified atherosclerosis at the skull base. No hyperdense vessel. Skull: No fracture or focal osseous lesion. Sinuses/Orbits: Visualized paranasal sinuses and mastoid air cells are clear bilateral cataract extraction is noted. Other: None. IMPRESSION: 1. No evidence of acute intracranial abnormality. 2. Mild chronic small vessel ischemic disease. Electronically Signed   By: Logan Bores M.D.   On: 06/28/2019 18:29   Pelvis Portable  Result Date: 06/29/2019 CLINICAL DATA:  Postoperative radiograph anterior approach left hip arthroplasty EXAM: PORTABLE PELVIS 1-2 VIEWS COMPARISON:  Intraoperative fluoroscopy same day, radiographs June 28, 2019 FINDINGS: Interval placement of left hip hemiarthroplasty with expected alignment of the femoral articular component and left acetabulum. There are expected postsurgical soft tissue changes including gas within the joint. Transcervical fixation  screws are present in proximal right femur with bony remodeling of the right femoral head which may suggest prior osteonecrosis. Vascular calcium is noted in the soft tissues. Foley catheter is in position. IMPRESSION: Interval left hip hemiarthroplasty without evidence of acute postoperative complication. Electronically Signed   By: Lovena Le M.D.   On: 06/29/2019 19:48   Dg Chest Port 1 View  Result Date: 06/28/2019 CLINICAL DATA:  Known left hip fracture EXAM: PORTABLE CHEST 1 VIEW COMPARISON:  03/17/2019 FINDINGS: Cardiac shadow is stable. Patient is rotated significantly to the right accentuating the mediastinal markings. Aortic calcifications are seen. No acute bony abnormality is noted. Lungs are clear bilaterally. IMPRESSION: No acute abnormality noted. Electronically Signed   By: Inez Catalina M.D.   On: 06/28/2019 14:49   Dg C-arm 1-60 Min  Result Date: 06/29/2019 CLINICAL DATA:  Left hip hemiarthroplasty EXAM: DG C-ARM 61-120 MIN; OPERATIVE LEFT HIP WITH PELVIS COMPARISON:  06/28/2019 FINDINGS: Three low resolution intraoperative spot views of the left hip. Total fluoroscopy  time was 6 seconds. Left femoral neck fracture. Subsequent placement of left hip replacement with normal alignment. Previous screw fixation of the right femur. IMPRESSION: Intraoperative fluoroscopic assistance provided during left hip replacement Electronically Signed   By: Donavan Foil M.D.   On: 06/29/2019 19:32   Dg Hip Operative Unilat W Or W/o Pelvis Left  Result Date: 06/29/2019 CLINICAL DATA:  Left hip hemiarthroplasty EXAM: DG C-ARM 61-120 MIN; OPERATIVE LEFT HIP WITH PELVIS COMPARISON:  06/28/2019 FINDINGS: Three low resolution intraoperative spot views of the left hip. Total fluoroscopy time was 6 seconds. Left femoral neck fracture. Subsequent placement of left hip replacement with normal alignment. Previous screw fixation of the right femur. IMPRESSION: Intraoperative fluoroscopic assistance provided during  left hip replacement Electronically Signed   By: Donavan Foil M.D.   On: 06/29/2019 19:32   Dg Hip Unilat W Or W/o Pelvis 2-3 Views Left  Result Date: 06/28/2019 CLINICAL DATA:  Hip pain after recent fall EXAM: DG HIP (WITH OR WITHOUT PELVIS) 2-3V LEFT; DG HIP (WITH OR WITHOUT PELVIS) 2-3V RIGHT COMPARISON:  06/15/2019 FINDINGS: There is an acute fracture of the left femoral neck, which appears predominantly subcapital. There is at least 2.5 cm of foreshortening and 1.5 cm of lateral displacement. Left femoroacetabular joint remains aligned without dislocation. Chronic deformity of the proximal right femur status post ORIF with intact hardware. The osseous structures are diffusely demineralized. No additional acute fractures are identified. SI joints and pubic symphysis are intact with mild degenerative changes. Advanced aortoiliac atherosclerotic calcifications. IMPRESSION: Acute, moderately displaced, foreshortened fracture of the left femoral neck. These results will be called to the ordering clinician or representative by the Radiologist Assistant, and communication documented in the PACS or zVision Dashboard. Electronically Signed   By: Davina Poke M.D.   On: 06/28/2019 11:46   Dg Hip Unilat W Or W/o Pelvis 2-3 Views Right  Result Date: 06/28/2019 CLINICAL DATA:  Hip pain after recent fall EXAM: DG HIP (WITH OR WITHOUT PELVIS) 2-3V LEFT; DG HIP (WITH OR WITHOUT PELVIS) 2-3V RIGHT COMPARISON:  06/15/2019 FINDINGS: There is an acute fracture of the left femoral neck, which appears predominantly subcapital. There is at least 2.5 cm of foreshortening and 1.5 cm of lateral displacement. Left femoroacetabular joint remains aligned without dislocation. Chronic deformity of the proximal right femur status post ORIF with intact hardware. The osseous structures are diffusely demineralized. No additional acute fractures are identified. SI joints and pubic symphysis are intact with mild degenerative changes.  Advanced aortoiliac atherosclerotic calcifications. IMPRESSION: Acute, moderately displaced, foreshortened fracture of the left femoral neck. These results will be called to the ordering clinician or representative by the Radiologist Assistant, and communication documented in the PACS or zVision Dashboard. Electronically Signed   By: Davina Poke M.D.   On: 06/28/2019 11:46    Cardiac Studies   Echocardiogram 06/29/2019: 1. The left ventricle has normal systolic function, with an ejection fraction of 55-60%. The cavity size was normal. There is mildly increased left ventricular wall thickness. Left ventricular diastolic function could not be evaluated secondary to  atrial fibrillation.  2. The right ventricle has normal systolic function. The cavity was normal.  3. The mitral valve is abnormal. There is moderate mitral annular calcification present. Mitral valve regurgitation is moderate by color flow Doppler.  4. The tricuspid valve is grossly normal.  5. The aortic valve is tricuspid. Severe calcifcation of the aortic valve. Aortic valve regurgitation is mild by color flow Doppler. Moderate stenosis of the  aortic valve.  6. The aorta is abnormal unless otherwise noted.  7. There is mild dilatation of the ascending aorta measuring 42 mm.  8. Normal LV systolic function; mild LVH; mildly dilated ascending aorta; heavily calcified aortic valve with moderate AS (mean gradient 20 mmHg; DI .29) and mild AI; moderate MR; mild TR.  Patient Profile     85 y/o female who resides at an ALF w/ her husband, with PMH that includes breast cancer s/p mastectomy, prior LE DVT treated w/ Xarelto, h/o repeated falls and HTN but no prior cardiac history, admitted after mechanical fall at home resulting in left femoral neck fracture. She was found to be in new atrial fibrillation w/ RVR for which cardiology is following  Assessment & Plan    1. New onset atrial fibrillation with RVR: found to be in Afib RVR on  presentation after suffering a fall at home resulting in a femoral neck fracture. Unclear acuity as patient was unaware of racing heart beat. Rates were in the 150s initially but improved to 90s-100s on IV diltiazem and po metoprolol. She continues to be mildly tachycardic CHA2DS2-VASc Score is 4 (HTN, Female, and Age >75), though anticoagulation was delayed given need for hip fracture repair and risk/benefits discussion given falls. She reports 4 falls in the past 3 months. - Continue metoprolol 100mg  BID - Continue diltiazem gtt - Consider apixaban 2.5mg  BID for stroke ppx (dose adjusted for age >4 and weight <60kg) if benefits outweigh risks  2. Valvulopathy: noted to have moderate AS and moderate MR on echo this admission.  - Continue to monitor routinely outpatient  3. HTN: BP persistently elevated. Home amlodipine held on admission - Will restart amlodipine 5mg  daily - Continue metoprolol and diltiazem as above  4. Hip fracture: s/p repair 06/29/2019 - Continue management per ortho  For questions or updates, please contact [place] Please consult [email or web address] for contact info under Cardiology/STEMI.      Signed, Abigail Butts, PA-C  06/30/2019, 10:10 AM   [phone]

## 2019-07-01 ENCOUNTER — Encounter (HOSPITAL_COMMUNITY): Payer: Self-pay | Admitting: Orthopaedic Surgery

## 2019-07-01 DIAGNOSIS — E440 Moderate protein-calorie malnutrition: Secondary | ICD-10-CM | POA: Insufficient documentation

## 2019-07-01 LAB — CBC
HCT: 31.9 % — ABNORMAL LOW (ref 36.0–46.0)
Hemoglobin: 10.2 g/dL — ABNORMAL LOW (ref 12.0–15.0)
MCH: 29.1 pg (ref 26.0–34.0)
MCHC: 32 g/dL (ref 30.0–36.0)
MCV: 91.1 fL (ref 80.0–100.0)
Platelets: 271 10*3/uL (ref 150–400)
RBC: 3.5 MIL/uL — ABNORMAL LOW (ref 3.87–5.11)
RDW: 13.9 % (ref 11.5–15.5)
WBC: 10.4 10*3/uL (ref 4.0–10.5)
nRBC: 0 % (ref 0.0–0.2)

## 2019-07-01 LAB — BASIC METABOLIC PANEL
Anion gap: 8 (ref 5–15)
BUN: 16 mg/dL (ref 8–23)
CO2: 23 mmol/L (ref 22–32)
Calcium: 8.4 mg/dL — ABNORMAL LOW (ref 8.9–10.3)
Chloride: 109 mmol/L (ref 98–111)
Creatinine, Ser: 0.5 mg/dL (ref 0.44–1.00)
GFR calc Af Amer: >60 mL/min (ref >60)
GFR calc non Af Amer: >60 mL/min (ref >60)
Glucose, Bld: 112 mg/dL — ABNORMAL HIGH (ref 70–99)
Potassium: 4.2 mmol/L (ref 3.5–5.1)
Sodium: 140 mmol/L (ref 135–145)

## 2019-07-01 MED ORDER — DILTIAZEM HCL ER COATED BEADS 240 MG PO CP24
240.0000 mg | ORAL_CAPSULE | Freq: Every day | ORAL | Status: DC
  Administered 2019-07-02 – 2019-07-04 (×3): 240 mg via ORAL
  Filled 2019-07-01 (×3): qty 1

## 2019-07-01 MED ORDER — METOPROLOL TARTRATE 5 MG/5ML IV SOLN
5.0000 mg | Freq: Once | INTRAVENOUS | Status: AC
  Administered 2019-07-01: 5 mg via INTRAVENOUS
  Filled 2019-07-01: qty 5

## 2019-07-01 MED ORDER — METOPROLOL TARTRATE 100 MG PO TABS
100.0000 mg | ORAL_TABLET | Freq: Two times a day (BID) | ORAL | Status: DC
  Administered 2019-07-01 – 2019-07-10 (×19): 100 mg via ORAL
  Filled 2019-07-01 (×19): qty 1

## 2019-07-01 MED ORDER — DILTIAZEM HCL 60 MG PO TABS
30.0000 mg | ORAL_TABLET | Freq: Three times a day (TID) | ORAL | Status: AC
  Administered 2019-07-01 – 2019-07-02 (×3): 30 mg via ORAL
  Filled 2019-07-01 (×3): qty 1

## 2019-07-01 MED FILL — Fentanyl Citrate Preservative Free (PF) Inj 100 MCG/2ML: INTRAMUSCULAR | Qty: 2 | Status: AC

## 2019-07-01 NOTE — Progress Notes (Signed)
PROGRESS NOTE    Betty Carpenter  ZDG:[id number] DOB: [birth year] DOA: 06/28/2019 PCP: Terald Sleeper, PA-C   Brief Narrative:  85 year old female with history of her with right mastectomy, hypertension was brought to the emergency department reporting that had a fall about 4 days ago.  Patient could not remember the details of the fall.  Per daughter patient fell backwards into her buttock.  At baseline patient walks with the help of the walker.  X-ray of the hip revealed left femoral neck fracture.  Orthopedic surgery is consulted.  Patient received 7 mg of morphine with oxygen saturations dropped to 80s.  Patient was placed on nonrebreather and was given Narcan.  In the emergency department patient was found to have atrial fibrillation with rapid ventricular rate of 160 -170.  Patient does not have any previous history of atrial fibrillation.  TSH 1.2.  COVID-19 test came back to be negative.  Initial set of cardiac enzymes were negative.  Orthopedic surgery is consulted, underwent hemiarthroplasty on 06/29/2019.  Assessment & Plan:  ##Left femoral neck fracture -Patient underwent hemiarthroplasty on 06/29/2019 -Continue with pain management as needed -Physical therapy evaluation and recommended SNF -DVT prophylaxis with Eliquis  ##New onset atrial fibrillation with rapid ventricular rate -Consulted cardiology -Troponins x3 came back to be negative -Echocardiogram showed preserved EF -Currently on Cardizem drip, also increase the dose of metoprolol 100 mg twice daily, still rate is not controlled -Slowly wean off Cardizem drip. -Patient has chads vasc score of 4, started on Eliquis 2.5 mg twice daily. -Added Cardizem CD 240 mg daily.  ##Acute blood loss anemia -Hemoglobin trended down to 11.4 -Closely follow-up  ##Pressure ulcer stage II -Continue with wound care  ##Protein calorie malnutrition moderate -Continue with high-protein diet  ##Acute blood loss anemia -Patient was admitted  with hemoglobin of 14, trended down to 10.9 -Hemoglobin remained stable  ##Dementia -Do not have patient's baseline  ##Chronic back pain -Patient is having significant pain in the lower back -Get x-ray of the lumbar spine.   Principal Problem:   Closed displaced fracture of left femoral neck (HCC) Active Problems:   Atrial fibrillation with rapid ventricular response (HCC)   Malnutrition of moderate degree      DVT prophylaxis: Lovenox when cleared by orthopedic surgery Code Status: (Full/.   Family Communication: Patient's daughter disposition Plan: Based on PT evaluation   Consultants:   Orthopedic surgery  Cardiology  Procedures: Left hip hemiarthroplasty on 06/29/2019  Subjective: Complaining of pain in the left hip  Objective: Vitals:   07/01/19 1055 07/01/19 1125 07/01/19 1141 07/01/19 1227  BP: (!) 89/64 106/76  (!) 107/58  Pulse: (!) 118 (!) 126    Resp: 16 14    Temp:   98.5 F (36.9 C)   TempSrc:   Axillary   SpO2: 100% 100%    Weight:      Height:        Intake/Output Summary (Last 24 hours) at 07/01/2019 1423 Last data filed at 07/01/2019 1233 Gross per 24 hour  Intake 457.38 ml  Output 1346 ml  Net -888.62 ml   Filed Weights   06/28/19 1317 06/29/19 1743 06/30/19 0344  Weight: 56 kg 56 kg 55 kg    Examination:  General exam: Mild distress from pain Respiratory system: Clear to auscultation. Respiratory effort normal. Cardiovascular system: S1 & S2 heard, iRRR. No JVD, murmurs, rubs, gallops or clicks. No pedal edema. Gastrointestinal system: Abdomen is nondistended, soft and nontender. No organomegaly or masses  felt. Normal bowel sounds heard. Central nervous system: Alert and oriented. No focal neurological deficits. Extremities: Significant pain in the left hip Skin: No rashes, lesions or ulcers Psychiatry: Poor judgment   Data Reviewed: I have personally reviewed following labs and imaging studies  CBC: Recent Labs  Lab  06/28/19 1432 06/29/19 0218 06/29/19 2203 06/30/19 0257 07/01/19 0253  WBC 11.8* 11.3* 14.5* 11.8* 10.4  NEUTROABS 10.2*  --   --   --   --   HGB 13.0 12.0 12.2 11.4* 10.2*  HCT 41.3 37.7 37.9 35.7* 31.9*  MCV 92.0 91.5 91.5 91.8 91.1  PLT 341 306 299 318 782   Basic Metabolic Panel: Recent Labs  Lab 06/28/19 1432 06/28/19 2018 06/29/19 0218 06/29/19 2203 06/30/19 0257 07/01/19 0253  NA 139  --  142  --  140 140  K 3.6  --  4.3  --  4.3 4.2  CL 102  --  107  --  106 109  CO2 24  --  22  --  22 23  GLUCOSE 124*  --  110*  --  147* 112*  BUN 59*  --  37*  --  19 16  CREATININE 1.03*  --  0.76 0.61 0.61 0.50  CALCIUM 9.4  --  9.3  --  9.1 8.4*  MG  --  2.3  --   --   --   --    GFR: Estimated Creatinine Clearance: 39 mL/min (by C-G formula based on SCr of 0.5 mg/dL). Liver Function Tests: No results for input(s): AST, ALT, ALKPHOS, BILITOT, PROT, ALBUMIN in the last 168 hours. No results for input(s): LIPASE, AMYLASE in the last 168 hours. No results for input(s): AMMONIA in the last 168 hours. Coagulation Profile: No results for input(s): INR, PROTIME in the last 168 hours. Cardiac Enzymes: No results for input(s): CKTOTAL, CKMB, CKMBINDEX, TROPONINI in the last 168 hours. BNP (last 3 results) No results for input(s): PROBNP in the last 8760 hours. HbA1C: No results for input(s): HGBA1C in the last 72 hours. CBG: Recent Labs  Lab 06/29/19 1132  GLUCAP 116*   Lipid Profile: No results for input(s): CHOL, HDL, LDLCALC, TRIG, CHOLHDL, LDLDIRECT in the last 72 hours. Thyroid Function Tests: Recent Labs    06/28/19 1432  TSH 1.230   Anemia Panel: No results for input(s): VITAMINB12, FOLATE, FERRITIN, TIBC, IRON, RETICCTPCT in the last 72 hours. Sepsis Labs: No results for input(s): PROCALCITON, LATICACIDVEN in the last 168 hours.  Recent Results (from the past 240 hour(s))  SARS Coronavirus 2 [facility] order, Performed in [facility] hospital lab)  Nasopharyngeal Nasopharyngeal Swab     Status: None   Collection Time: 06/28/19  3:40 PM   Specimen: Nasopharyngeal Swab  Result Value Ref Range Status   SARS Coronavirus 2 NEGATIVE NEGATIVE Final    Comment: (NOTE) If result is NEGATIVE SARS-CoV-2 target nucleic acids are NOT DETECTED. The SARS-CoV-2 RNA is generally detectable in upper and lower  respiratory specimens during the acute phase of infection. The lowest  concentration of SARS-CoV-2 viral copies this assay can detect is 250  copies / mL. A negative result does not preclude SARS-CoV-2 infection  and should not be used as the sole basis for treatment or other  patient management decisions.  A negative result may occur with  improper specimen collection / handling, submission of specimen other  than nasopharyngeal swab, presence of viral mutation(s) within the  areas targeted by this assay, and inadequate number of  viral copies  (<250 copies / mL). A negative result must be combined with clinical  observations, patient history, and epidemiological information. If result is POSITIVE SARS-CoV-2 target nucleic acids are DETECTED. The SARS-CoV-2 RNA is generally detectable in upper and lower  respiratory specimens dur ing the acute phase of infection.  Positive  results are indicative of active infection with SARS-CoV-2.  Clinical  correlation with patient history and other diagnostic information is  necessary to determine patient infection status.  Positive results do  not rule out bacterial infection or co-infection with other viruses. If result is PRESUMPTIVE POSTIVE SARS-CoV-2 nucleic acids MAY BE PRESENT.   A presumptive positive result was obtained on the submitted specimen  and confirmed on repeat testing.  While 2019 novel coronavirus  (SARS-CoV-2) nucleic acids may be present in the submitted sample  additional confirmatory testing may be necessary for epidemiological  and / or clinical management purposes  to  differentiate between  SARS-CoV-2 and other Sarbecovirus currently known to infect humans.  If clinically indicated additional testing with an alternate test  methodology [phone]) is advised. The SARS-CoV-2 RNA is generally  detectable in upper and lower respiratory sp ecimens during the acute  phase of infection. The expected result is Negative. Fact Sheet for Patients:  StrictlyIdeas.no Fact Sheet for Healthcare Providers: BankingDealers.co.za This test is not yet approved or cleared by the [place] FDA and has been authorized for detection and/or diagnosis of SARS-CoV-2 by FDA under an Emergency Use Authorization (EUA).  This EUA will remain in effect (meaning this test can be used) for the duration of the COVID-19 declaration under Section 564(b)(1) of the Act, 21 U.S.C. section 360bbb-3(b)(1), unless the authorization is terminated or revoked sooner. Performed at [facility], [address], [place], [place] [address]   Surgical pcr screen     Status: Abnormal   Collection Time: 06/28/19 10:45 PM   Specimen: Nasal Mucosa; Nasal Swab  Result Value Ref Range Status   MRSA, PCR NEGATIVE NEGATIVE Final   Staphylococcus aureus POSITIVE (A) NEGATIVE Final    Comment: (NOTE) The Xpert SA Assay (FDA approved for NASAL specimens in patients 23 years of age and older), is one component of a comprehensive surveillance program. It is not intended to diagnose infection nor to guide or monitor treatment. Performed at [facility], [place] [address], [place],  [address]          Radiology Studies: Pelvis Portable  Result Date: 06/29/2019 CLINICAL DATA:  Postoperative radiograph anterior approach left hip arthroplasty EXAM: PORTABLE PELVIS 1-2 VIEWS COMPARISON:  Intraoperative fluoroscopy same day, radiographs June 28, 2019 FINDINGS: Interval placement of left hip hemiarthroplasty with expected alignment of the femoral  articular component and left acetabulum. There are expected postsurgical soft tissue changes including gas within the joint. Transcervical fixation screws are present in proximal right femur with bony remodeling of the right femoral head which may suggest prior osteonecrosis. Vascular calcium is noted in the soft tissues. Foley catheter is in position. IMPRESSION: Interval left hip hemiarthroplasty without evidence of acute postoperative complication. Electronically Signed   By: Lovena Le M.D.   On: 06/29/2019 19:48   Dg C-arm 1-60 Min  Result Date: 06/29/2019 CLINICAL DATA:  Left hip hemiarthroplasty EXAM: DG C-ARM 61-120 MIN; OPERATIVE LEFT HIP WITH PELVIS COMPARISON:  06/28/2019 FINDINGS: Three low resolution intraoperative spot views of the left hip. Total fluoroscopy time was 6 seconds. Left femoral neck fracture. Subsequent placement of left hip replacement with normal alignment.  Previous screw fixation of the right femur. IMPRESSION: Intraoperative fluoroscopic assistance provided during left hip replacement Electronically Signed   By: Donavan Foil M.D.   On: 06/29/2019 19:32   Dg Hip Operative Unilat W Or W/o Pelvis Left  Result Date: 06/29/2019 CLINICAL DATA:  Left hip hemiarthroplasty EXAM: DG C-ARM 61-120 MIN; OPERATIVE LEFT HIP WITH PELVIS COMPARISON:  06/28/2019 FINDINGS: Three low resolution intraoperative spot views of the left hip. Total fluoroscopy time was 6 seconds. Left femoral neck fracture. Subsequent placement of left hip replacement with normal alignment. Previous screw fixation of the right femur. IMPRESSION: Intraoperative fluoroscopic assistance provided during left hip replacement Electronically Signed   By: Donavan Foil M.D.   On: 06/29/2019 19:32        Scheduled Meds: . [START ON 07/02/2019] diltiazem  240 mg Oral Daily  . diltiazem  30 mg Oral Q8H  . docusate sodium  100 mg Oral BID  . enoxaparin (LOVENOX) injection  40 mg Subcutaneous Q24H  . feeding  supplement (ENSURE ENLIVE)  237 mL Oral TID BM  . lubiprostone  24 mcg Oral BID  . metoprolol tartrate  100 mg Oral BID  . multivitamin with minerals  1 tablet Oral Daily  . mupirocin ointment  1 application Topical BID  . mupirocin ointment  1 application Topical Daily  . pantoprazole  20 mg Oral Daily   Continuous Infusions: . sodium chloride 75 mL/hr at 06/29/19 1159  . sodium chloride 75 mL/hr at 07/01/19 0237  . lactated ringers 10 mL/hr at 06/29/19 1744  . methocarbamol (ROBAXIN) IV       LOS: 3 days    Time spent: 25min    Keenan Trefry, MD [facility] Pager [phone]  If 7PM-7AM, please contact night-coverage [email or web address] Password [facility] 07/01/2019, 2:23 PM

## 2019-07-01 NOTE — Progress Notes (Signed)
Patient is resting comfortably this morning. Surgical dressing c/d/i. Hgb stable. Stable from ortho stand point SNF pending.  F/u 2 weeks for suture removal.  Azucena Cecil, MD [facility] [phone] 8:00 AM

## 2019-07-01 NOTE — Progress Notes (Addendum)
Patients foley discontinue, and was able to pass her voiding trial. Patient continues to have elevated HR 126 and afib. Pain medication and  Cardizem have been given. Will continue to monitor pt HR and pain.

## 2019-07-01 NOTE — Care Management Important Message (Signed)
Important Message  Patient Details  Name: Betty Carpenter MRN: [id number] Date of Birth: 11/20/1924   Medicare Important Message Given:  Yes     Ilaria Much [address] 07/01/2019, 2:36 PM

## 2019-07-01 NOTE — Progress Notes (Addendum)
Progress Note  Patient Name: Betty Carpenter Date of Encounter: 07/01/2019  Primary Cardiologist: Pixie Casino, MD   Subjective   Heart rate sustained in the 140s to 150s overnight and this a.m.  Patient not feeling well secondary to hip and back pain.  Likely this is culprit for sustained elevated HR.  Denies chest pain or shortness of breath.  Inpatient Medications    Scheduled Meds: . diltiazem  180 mg Oral Daily  . docusate sodium  100 mg Oral BID  . enoxaparin (LOVENOX) injection  40 mg Subcutaneous Q24H  . feeding supplement (ENSURE ENLIVE)  237 mL Oral TID BM  . lubiprostone  24 mcg Oral BID  . metoprolol tartrate  75 mg Oral BID  . multivitamin with minerals  1 tablet Oral Daily  . mupirocin ointment  1 application Topical BID  . mupirocin ointment  1 application Topical Daily  . pantoprazole  20 mg Oral Daily   Continuous Infusions: . sodium chloride 75 mL/hr at 06/29/19 1159  . sodium chloride 75 mL/hr at 07/01/19 0237  . lactated ringers 10 mL/hr at 06/29/19 1744  . methocarbamol (ROBAXIN) IV     PRN Meds: acetaminophen **OR** acetaminophen, alum & mag hydroxide-simeth, HYDROcodone-acetaminophen, HYDROcodone-acetaminophen, HYDROmorphone (DILAUDID) injection, magnesium citrate, menthol-cetylpyridinium **OR** phenol, methocarbamol **OR** methocarbamol (ROBAXIN) IV, morphine injection, ondansetron **OR** ondansetron (ZOFRAN) IV, polyethylene glycol, sorbitol   Vital Signs    Vitals:   06/30/19 2210 06/30/19 2249 07/01/19 0425 07/01/19 0741  BP: 131/82     Pulse: (!) 110 98    Resp: 17     Temp:  98.4 F (36.9 C) 98.2 F (36.8 C) 97.9 F (36.6 C)  TempSrc:  Axillary Oral Axillary  SpO2: 99% 100%    Weight:      Height:        Intake/Output Summary (Last 24 hours) at 07/01/2019 0829 Last data filed at 07/01/2019 0300 Gross per 24 hour  Intake 1275.15 ml  Output 1050 ml  Net 225.15 ml   Filed Weights   06/28/19 1317 06/29/19 1743 06/30/19 0344   Weight: 56 kg 56 kg 55 kg    Physical Exam   General: Elderly, NAD Neck: Negative for carotid bruits. No JVD Lungs:Clear to ausculation bilaterally. No wheezes, rales, or rhonchi. Breathing is unlabored. Cardiovascular: Irregularly irregular with S1 S2. + murmur Abdomen: Soft, non-tender, non-distended. No obvious abdominal masses. Extremities: No edema. No clubbing or cyanosis. DP/PT pulses 1+ bilaterally Neuro: Alert and oriented. No focal deficits. No facial asymmetry. MAE spontaneously. Psych: Responds to questions appropriately with normal affect.    Labs    Chemistry Recent Labs  Lab 06/29/19 0218 06/29/19 2203 06/30/19 0257 07/01/19 0253  NA 142  --  140 140  K 4.3  --  4.3 4.2  CL 107  --  106 109  CO2 22  --  22 23  GLUCOSE 110*  --  147* 112*  BUN 37*  --  19 16  CREATININE 0.76 0.61 0.61 0.50  CALCIUM 9.3  --  9.1 8.4*  GFRNONAA >60 >60 >60 >60  GFRAA >60 >60 >60 >60  ANIONGAP 13  --  12 8     Hematology Recent Labs  Lab 06/29/19 2203 06/30/19 0257 07/01/19 0253  WBC 14.5* 11.8* 10.4  RBC 4.14 3.89 3.50*  HGB 12.2 11.4* 10.2*  HCT 37.9 35.7* 31.9*  MCV 91.5 91.8 91.1  MCH 29.5 29.3 29.1  MCHC 32.2 31.9 32.0  RDW 14.1  14.0 13.9  PLT 299 318 271    Cardiac EnzymesNo results for input(s): TROPONINI in the last 168 hours. No results for input(s): TROPIPOC in the last 168 hours.   BNPNo results for input(s): BNP, PROBNP in the last 168 hours.   DDimer No results for input(s): DDIMER in the last 168 hours.   Radiology    Pelvis Portable  Result Date: 06/29/2019 CLINICAL DATA:  Postoperative radiograph anterior approach left hip arthroplasty EXAM: PORTABLE PELVIS 1-2 VIEWS COMPARISON:  Intraoperative fluoroscopy same day, radiographs June 28, 2019 FINDINGS: Interval placement of left hip hemiarthroplasty with expected alignment of the femoral articular component and left acetabulum. There are expected postsurgical soft tissue changes including gas  within the joint. Transcervical fixation screws are present in proximal right femur with bony remodeling of the right femoral head which may suggest prior osteonecrosis. Vascular calcium is noted in the soft tissues. Foley catheter is in position. IMPRESSION: Interval left hip hemiarthroplasty without evidence of acute postoperative complication. Electronically Signed   By: Lovena Le M.D.   On: 06/29/2019 19:48   Dg C-arm 1-60 Min  Result Date: 06/29/2019 CLINICAL DATA:  Left hip hemiarthroplasty EXAM: DG C-ARM 61-120 MIN; OPERATIVE LEFT HIP WITH PELVIS COMPARISON:  06/28/2019 FINDINGS: Three low resolution intraoperative spot views of the left hip. Total fluoroscopy time was 6 seconds. Left femoral neck fracture. Subsequent placement of left hip replacement with normal alignment. Previous screw fixation of the right femur. IMPRESSION: Intraoperative fluoroscopic assistance provided during left hip replacement Electronically Signed   By: Donavan Foil M.D.   On: 06/29/2019 19:32   Dg Hip Operative Unilat W Or W/o Pelvis Left  Result Date: 06/29/2019 CLINICAL DATA:  Left hip hemiarthroplasty EXAM: DG C-ARM 61-120 MIN; OPERATIVE LEFT HIP WITH PELVIS COMPARISON:  06/28/2019 FINDINGS: Three low resolution intraoperative spot views of the left hip. Total fluoroscopy time was 6 seconds. Left femoral neck fracture. Subsequent placement of left hip replacement with normal alignment. Previous screw fixation of the right femur. IMPRESSION: Intraoperative fluoroscopic assistance provided during left hip replacement Electronically Signed   By: Donavan Foil M.D.   On: 06/29/2019 19:32   Telemetry    07/01/2019 atrial fibrillation with RVR, HR 150 bpm- Personally Reviewed  ECG   008/09/2019-Atrial Fibrillation with RVR, HR 121- Personally Reviewed  Cardiac Studies   Echocardiogram 06/29/2019: 1. The left ventricle has normal systolic function, with an ejection fraction of 55-60%. The cavity size was normal.  There is mildly increased left ventricular wall thickness. Left ventricular diastolic function could not be evaluated secondary to  atrial fibrillation. 2. The right ventricle has normal systolic function. The cavity was normal. 3. The mitral valve is abnormal. There is moderate mitral annular calcification present. Mitral valve regurgitation is moderate by color flow Doppler. 4. The tricuspid valve is grossly normal. 5. The aortic valve is tricuspid. Severe calcifcation of the aortic valve. Aortic valve regurgitation is mild by color flow Doppler. Moderate stenosis of the aortic valve. 6. The aorta is abnormal unless otherwise noted. 7. There is mild dilatation of the ascending aorta measuring 42 mm. 8. Normal LV systolic function; mild LVH; mildly dilated ascending aorta; heavily calcified aortic valve with moderate AS (mean gradient 20 mmHg; DI .29) and mild AI; moderate MR; mild TR.  Patient Profile     85 y.o. female who resides at an ALF w/ her husband,with PMH that includes breast cancer s/p mastectomy, prior LE DVT treated w/ Xarelto, h/o repeated fallsand HTN but no  prior cardiac history, admitted after mechanical fall at home resulting in left femoral neck fracture. She was found to be in new atrial fibrillation w/ RVR for which cardiology is following  Assessment & Plan    1. New onset atrial fibrillation with RVR: -Found to be in new atrial fibrillation with RVR on presentation after suffering a mechanical fall at home resulting in a femoral neck fracture. -Heart rates on presentation were in the 150 range with improvement to the 90-100s on IV diltiazem and p.o. metoprolol -CHA2DS2-VASc Score is 4 (HTN, Female, and Age >75), though anticoagulation was delayed given need for hip fracture repair and risk/benefits discussion given falls. She reports 4 falls in the past 3 months. -Increase Metoprolol to 100mg  BID -Will give IV Metoprolol 5mg  x1 for HR of 150 this AM  -Consider  increasing Cardizem CD to 240 QD if up-titration of BB not helpful -Apixaban 2.5mg  BID for stroke prevention when okay per surgical team (dose adjusted for age >28 and weight <60kg)   2.  Moderate aortic stenosis/moderate mitral regurgitation:  -Per echocardiogram this admission -Continue to monitor routinely outpatient  3. HTN:  -BP more stable today, 131/82, 143/87, 129/85, 130/80 -Continue metoprolol and diltiazem as above  4. Hip fracture:  -s/p repair 06/29/2019 -Continue management per ortho   Signed, Kathyrn Drown NP-C [facility] Pager: [phone] 07/01/2019, 8:29 AM     For questions or updates, please contact   Please consult [email or web address] for contact info under Cardiology/STEMI.

## 2019-07-02 ENCOUNTER — Inpatient Hospital Stay (HOSPITAL_COMMUNITY): Payer: Medicare Other

## 2019-07-02 LAB — CBC
HCT: 32 % — ABNORMAL LOW (ref 36.0–46.0)
Hemoglobin: 10.2 g/dL — ABNORMAL LOW (ref 12.0–15.0)
MCH: 29.3 pg (ref 26.0–34.0)
MCHC: 31.9 g/dL (ref 30.0–36.0)
MCV: 92 fL (ref 80.0–100.0)
Platelets: 266 10*3/uL (ref 150–400)
RBC: 3.48 MIL/uL — ABNORMAL LOW (ref 3.87–5.11)
RDW: 13.9 % (ref 11.5–15.5)
WBC: 9.6 10*3/uL (ref 4.0–10.5)
nRBC: 0 % (ref 0.0–0.2)

## 2019-07-02 MED ORDER — LIDOCAINE 5 % EX PTCH
1.0000 | MEDICATED_PATCH | CUTANEOUS | Status: DC
  Administered 2019-07-02 – 2019-07-11 (×11): 1 via TRANSDERMAL
  Filled 2019-07-02 (×10): qty 1

## 2019-07-02 MED ORDER — DIGOXIN 0.25 MG/ML IJ SOLN
0.2500 mg | Freq: Once | INTRAMUSCULAR | Status: AC
  Administered 2019-07-02: 19:00:00 0.25 mg via INTRAVENOUS
  Filled 2019-07-02: qty 2

## 2019-07-02 MED ORDER — SODIUM CHLORIDE 0.9 % IV BOLUS
500.0000 mL | Freq: Once | INTRAVENOUS | Status: AC
  Administered 2019-07-02: 500 mL via INTRAVENOUS

## 2019-07-02 NOTE — Progress Notes (Signed)
PROGRESS NOTE    Betty Carpenter  OZY:[id number] DOB: June 11, 1925 DOA: 06/28/2019 PCP: Terald Sleeper, PA-C   Brief Narrative:  85 year old female with history of her with right mastectomy, hypertension was brought to the emergency department reporting that had a fall about 4 days ago.  Patient could not remember the details of the fall.  Per daughter patient fell backwards into her buttock.  At baseline patient walks with the help of the walker.  X-ray of the hip revealed left femoral neck fracture.  Orthopedic surgery is consulted.  Patient received 7 mg of morphine with oxygen saturations dropped to 80s.  Patient was placed on nonrebreather and was given Narcan.  In the emergency department patient was found to have atrial fibrillation with rapid ventricular rate of 160 -170.  Patient does not have any previous history of atrial fibrillation.  TSH 1.2.  COVID-19 test came back to be negative.  Initial set of cardiac enzymes were negative.  Orthopedic surgery is consulted, underwent hemiarthroplasty on 06/29/2019.  Evaluated by physical therapy, recommended skilled nursing facility.  Assessment & Plan:  ##Left femoral neck fracture -Patient underwent hemiarthroplasty on 06/29/2019 -Continue with pain management as needed -Physical therapy evaluation and recommended SNF -DVT prophylaxis with Eliquis  ##New onset atrial fibrillation with rapid ventricular rate -Consulted cardiology -Troponins x3 came back to be negative -Echocardiogram showed preserved EF -Off of Cardizem drip. -Currently on metoprolol 100 mg twice daily, Cardizem CD 240 mg daily, still rate is poorly controlled -Patient has chads vasc score of 4, started on Eliquis 2.5 mg twice daily.   ##Acute blood loss anemia -Hemoglobin trended down to 11.4 -Closely follow-up  ##Pressure ulcer stage II -Continue with wound care  ##Protein calorie malnutrition moderate -Continue with high-protein diet  ##Acute blood loss anemia  -Patient was admitted with hemoglobin of 14, trended down to 10.9  -Hemoglobin remained stable  ##Dementia -Do not have patient's baseline  ##Chronic back pain -Patient is having significant pain in the lower back -CT of the lumbar spine showed chronic L2 fracture, augmentation of T12 -Patient has significant tenderness in the L2 area, started the patient on lidocaine patch   Principal Problem:   Closed displaced fracture of left femoral neck ([place]) Active Problems:   Atrial fibrillation with rapid ventricular response (HCC)   Malnutrition of moderate degree      DVT prophylaxis: Lovenox when cleared by orthopedic surgery Code Status: (Full/.   Family Communication: Patient's daughter disposition Plan: Based on PT evaluation   Consultants:   Orthopedic surgery  Cardiology  Procedures: Left hip hemiarthroplasty on 06/29/2019  Subjective: Complaining of pain in the back.  Objective: Vitals:   07/02/19 0735 07/02/19 0910 07/02/19 1013 07/02/19 1154  BP:  133/86    Pulse:  (!) 120 (!) 115   Resp:  20 20   Temp: 98.5 F (36.9 C)   98.4 F (36.9 C)  TempSrc: Axillary   Axillary  SpO2:   96%   Weight:      Height:        Intake/Output Summary (Last 24 hours) at 07/02/2019 1354 Last data filed at 07/02/2019 1249 Gross per 24 hour  Intake 1715.85 ml  Output 550 ml  Net 1165.85 ml   Filed Weights   06/28/19 1317 06/29/19 1743 06/30/19 0344  Weight: 56 kg 56 kg 55 kg    Examination:  General exam: Mild distress from pain Respiratory system: Clear to auscultation. Respiratory effort normal. Cardiovascular system: S1 & S2 heard, iRRR.  No JVD, murmurs, rubs, gallops or clicks. No pedal edema. Gastrointestinal system: Abdomen is nondistended, soft and nontender. No organomegaly or masses felt. Normal bowel sounds heard. Central nervous system: Alert and oriented. No focal neurological deficits. Extremities: Significant pain in the left hip Skin: No rashes,  lesions or ulcers Psychiatry: Poor judgment   Data Reviewed: I have personally reviewed following labs and imaging studies  CBC: Recent Labs  Lab 06/28/19 1432 06/29/19 0218 06/29/19 2203 06/30/19 0257 07/01/19 0253 07/02/19 0304  WBC 11.8* 11.3* 14.5* 11.8* 10.4 9.6  NEUTROABS 10.2*  --   --   --   --   --   HGB 13.0 12.0 12.2 11.4* 10.2* 10.2*  HCT 41.3 37.7 37.9 35.7* 31.9* 32.0*  MCV 92.0 91.5 91.5 91.8 91.1 92.0  PLT 341 306 299 318 271 725   Basic Metabolic Panel: Recent Labs  Lab 06/28/19 1432 06/28/19 2018 06/29/19 0218 06/29/19 2203 06/30/19 0257 07/01/19 0253  NA 139  --  142  --  140 140  K 3.6  --  4.3  --  4.3 4.2  CL 102  --  107  --  106 109  CO2 24  --  22  --  22 23  GLUCOSE 124*  --  110*  --  147* 112*  BUN 59*  --  37*  --  19 16  CREATININE 1.03*  --  0.76 0.61 0.61 0.50  CALCIUM 9.4  --  9.3  --  9.1 8.4*  MG  --  2.3  --   --   --   --    GFR: Estimated Creatinine Clearance: 39 mL/min (by C-G formula based on SCr of 0.5 mg/dL). Liver Function Tests: No results for input(s): AST, ALT, ALKPHOS, BILITOT, PROT, ALBUMIN in the last 168 hours. No results for input(s): LIPASE, AMYLASE in the last 168 hours. No results for input(s): AMMONIA in the last 168 hours. Coagulation Profile: No results for input(s): INR, PROTIME in the last 168 hours. Cardiac Enzymes: No results for input(s): CKTOTAL, CKMB, CKMBINDEX, TROPONINI in the last 168 hours. BNP (last 3 results) No results for input(s): PROBNP in the last 8760 hours. HbA1C: No results for input(s): HGBA1C in the last 72 hours. CBG: Recent Labs  Lab 06/29/19 1132  GLUCAP 116*   Lipid Profile: No results for input(s): CHOL, HDL, LDLCALC, TRIG, CHOLHDL, LDLDIRECT in the last 72 hours. Thyroid Function Tests: No results for input(s): TSH, T4TOTAL, FREET4, T3FREE, THYROIDAB in the last 72 hours. Anemia Panel: No results for input(s): VITAMINB12, FOLATE, FERRITIN, TIBC, IRON, RETICCTPCT in  the last 72 hours. Sepsis Labs: No results for input(s): PROCALCITON, LATICACIDVEN in the last 168 hours.  Recent Results (from the past 240 hour(s))  SARS Coronavirus 2 [facility] order, Performed in [facility] hospital lab) Nasopharyngeal Nasopharyngeal Swab     Status: None   Collection Time: 06/28/19  3:40 PM   Specimen: Nasopharyngeal Swab  Result Value Ref Range Status   SARS Coronavirus 2 NEGATIVE NEGATIVE Final    Comment: (NOTE) If result is NEGATIVE SARS-CoV-2 target nucleic acids are NOT DETECTED. The SARS-CoV-2 RNA is generally detectable in upper and lower  respiratory specimens during the acute phase of infection. The lowest  concentration of SARS-CoV-2 viral copies this assay can detect is 250  copies / mL. A negative result does not preclude SARS-CoV-2 infection  and should not be used as the sole basis for treatment or other  patient management decisions.  A negative  result may occur with  improper specimen collection / handling, submission of specimen other  than nasopharyngeal swab, presence of viral mutation(s) within the  areas targeted by this assay, and inadequate number of viral copies  (<250 copies / mL). A negative result must be combined with clinical  observations, patient history, and epidemiological information. If result is POSITIVE SARS-CoV-2 target nucleic acids are DETECTED. The SARS-CoV-2 RNA is generally detectable in upper and lower  respiratory specimens dur ing the acute phase of infection.  Positive  results are indicative of active infection with SARS-CoV-2.  Clinical  correlation with patient history and other diagnostic information is  necessary to determine patient infection status.  Positive results do  not rule out bacterial infection or co-infection with other viruses. If result is PRESUMPTIVE POSTIVE SARS-CoV-2 nucleic acids MAY BE PRESENT.   A presumptive positive result was obtained on the submitted specimen  and confirmed on  repeat testing.  While 2019 novel coronavirus  (SARS-CoV-2) nucleic acids may be present in the submitted sample  additional confirmatory testing may be necessary for epidemiological  and / or clinical management purposes  to differentiate between  SARS-CoV-2 and other Sarbecovirus currently known to infect humans.  If clinically indicated additional testing with an alternate test  methodology [phone]) is advised. The SARS-CoV-2 RNA is generally  detectable in upper and lower respiratory sp ecimens during the acute  phase of infection. The expected result is Negative. Fact Sheet for Patients:  StrictlyIdeas.no Fact Sheet for Healthcare Providers: BankingDealers.co.za This test is not yet approved or cleared by the [place] FDA and has been authorized for detection and/or diagnosis of SARS-CoV-2 by FDA under an Emergency Use Authorization (EUA).  This EUA will remain in effect (meaning this test can be used) for the duration of the COVID-19 declaration under Section 564(b)(1) of the Act, 21 U.S.C. section 360bbb-3(b)(1), unless the authorization is terminated or revoked sooner. Performed at [facility], [address], [place], [place] [address]   Surgical pcr screen     Status: Abnormal   Collection Time: 06/28/19 10:45 PM   Specimen: Nasal Mucosa; Nasal Swab  Result Value Ref Range Status   MRSA, PCR NEGATIVE NEGATIVE Final   Staphylococcus aureus POSITIVE (A) NEGATIVE Final    Comment: (NOTE) The Xpert SA Assay (FDA approved for NASAL specimens in patients 34 years of age and older), is one component of a comprehensive surveillance program. It is not intended to diagnose infection nor to guide or monitor treatment. Performed at [facility], [place] [address], [place], [place] [address]          Radiology Studies: Ct Lumbar Spine Wo Contrast  Result Date: 07/02/2019 CLINICAL DATA:  Fall with low back pain EXAM: CT  LUMBAR SPINE WITHOUT CONTRAST TECHNIQUE: Multidetector CT imaging of the lumbar spine was performed without intravenous contrast administration. Multiplanar CT image reconstructions were also generated. COMPARISON:  02/26/2018 lumbar spine CT FINDINGS: Segmentation: Normal Alignment: Rightward listhesis of L3 on L4, unchanged. Vertebrae: Status post augmentation of T12. Compression deformity of L1 is unchanged. Paraspinal and other soft tissues: Small left pleural effusion. Extensive calcific aortic atherosclerosis. There is soft tissue gas within the left thigh, likely postoperative. There is thickening of gallbladder wall. Disc levels: No bony spinal canal stenosis. Unchanged severe right L4-5 foraminal stenosis. IMPRESSION: 1. No acute abnormality of the lumbar spine. 2. Chronic compression deformity of L1, unchanged. 3. Status post T12 vertebral augmentation. 4.  Aortic atherosclerosis (ICD10-I70.0). 5. Small left  pleural effusion. 6. Soft tissue gas within the left thighs likely postoperative. Electronically Signed   By: Ulyses Jarred M.D.   On: 07/02/2019 02:12        Scheduled Meds: . diltiazem  240 mg Oral Daily  . docusate sodium  100 mg Oral BID  . enoxaparin (LOVENOX) injection  40 mg Subcutaneous Q24H  . feeding supplement (ENSURE ENLIVE)  237 mL Oral TID BM  . lidocaine  1 patch Transdermal Q24H  . lubiprostone  24 mcg Oral BID  . metoprolol tartrate  100 mg Oral BID  . multivitamin with minerals  1 tablet Oral Daily  . mupirocin ointment  1 application Topical BID  . mupirocin ointment  1 application Topical Daily  . pantoprazole  20 mg Oral Daily   Continuous Infusions: . sodium chloride 75 mL/hr at 06/29/19 1159  . sodium chloride 75 mL/hr at 07/01/19 1734  . lactated ringers 10 mL/hr at 06/29/19 1744  . methocarbamol (ROBAXIN) IV       LOS: 4 days    Time spent: 67min    Rihaan Barrack, MD [facility] Pager [phone]  If 7PM-7AM, please contact  night-coverage [email or web address] Password [facility] 07/02/2019, 1:54 PM

## 2019-07-02 NOTE — Progress Notes (Signed)
Pt transported to CT at [facility], by [facility].

## 2019-07-02 NOTE — Progress Notes (Addendum)
Pt having sustained HR in the 130s to 150, Afib rhythm which patient has been. Note discrepency between pulse rate (SpO2 lead on finger) and ECG heart rate. Pt has no symptoms; not lightheaded, no change in orientation level. Pt is forgetful, but this is not new over the course of today. MD Vasireddy notified of heart rate. Pt continues to have pain in lower back - was resting comfortably after placement of Lidocaine patch. Given prn Vicodin per MAR.  Vital Signs MEWS/VS Documentation      07/02/2019 1155 07/02/2019 1200 07/02/2019 1748 07/02/2019 1800   MEWS Score:  1  1  1  4    MEWS Score Color:  Green  Green  Green  Red   Resp:  17  18  -  (!) 21   Pulse:  (!) 110  99  -  62   BP:  (!) 102/59  -  -  -   Temp:  -  -  (!) 97.5 F (36.4 C)  -           Esmae Donathan 07/02/2019,6:12 PM   1838 - orders received for one time dose of IV digoxin 1859 - dose verified by pharmacy and given. HR 130s, Afib at time of administration. 1932 - HR and rhythm unchanged: pt still sustaining 130s, still Afib. Handed off to night shift, coverage MD paged.

## 2019-07-02 NOTE — Progress Notes (Signed)
Progress Note  Patient Name: Betty Carpenter Date of Encounter: 07/02/2019  Primary Cardiologist: Pixie Casino, MD   Subjective   "I feel ok."  Inpatient Medications    Scheduled Meds: . diltiazem  240 mg Oral Daily  . docusate sodium  100 mg Oral BID  . enoxaparin (LOVENOX) injection  40 mg Subcutaneous Q24H  . feeding supplement (ENSURE ENLIVE)  237 mL Oral TID BM  . lidocaine  1 patch Transdermal Q24H  . lubiprostone  24 mcg Oral BID  . metoprolol tartrate  100 mg Oral BID  . multivitamin with minerals  1 tablet Oral Daily  . mupirocin ointment  1 application Topical BID  . mupirocin ointment  1 application Topical Daily  . pantoprazole  20 mg Oral Daily   Continuous Infusions: . sodium chloride 75 mL/hr at 06/29/19 1159  . sodium chloride 75 mL/hr at 07/01/19 1734  . lactated ringers 10 mL/hr at 06/29/19 1744  . methocarbamol (ROBAXIN) IV     PRN Meds: acetaminophen **OR** acetaminophen, alum & mag hydroxide-simeth, HYDROcodone-acetaminophen, HYDROcodone-acetaminophen, HYDROmorphone (DILAUDID) injection, magnesium citrate, menthol-cetylpyridinium **OR** phenol, methocarbamol **OR** methocarbamol (ROBAXIN) IV, morphine injection, ondansetron **OR** ondansetron (ZOFRAN) IV, polyethylene glycol, sorbitol   Vital Signs    Vitals:   07/02/19 0735 07/02/19 0910 07/02/19 1013 07/02/19 1154  BP:  133/86    Pulse:  (!) 120 (!) 115   Resp:  20 20   Temp: 98.5 F (36.9 C)   98.4 F (36.9 C)  TempSrc: Axillary   Axillary  SpO2:   96%   Weight:      Height:        Intake/Output Summary (Last 24 hours) at 07/02/2019 1211 Last data filed at 07/02/2019 1139 Gross per 24 hour  Intake 1077 ml  Output 646 ml  Net 431 ml   Filed Weights   06/28/19 1317 06/29/19 1743 06/30/19 0344  Weight: 56 kg 56 kg 55 kg    Telemetry    Atrial fib with a RVR/CVR - Personally Reviewed  ECG    none - Personally Reviewed  Physical Exam   GEN: elderly woman, no acute distress.    Neck: No JVD Cardiac: IRIRR, no murmurs, rubs, or gallops.  Respiratory: Clear to auscultation bilaterally. GI: Soft, nontender, non-distended  MS: No edema; No deformity. Neuro:  Nonfocal  Psych: Normal affect   Labs    Chemistry Recent Labs  Lab 06/29/19 0218 06/29/19 2203 06/30/19 0257 07/01/19 0253  NA 142  --  140 140  K 4.3  --  4.3 4.2  CL 107  --  106 109  CO2 22  --  22 23  GLUCOSE 110*  --  147* 112*  BUN 37*  --  19 16  CREATININE 0.76 0.61 0.61 0.50  CALCIUM 9.3  --  9.1 8.4*  GFRNONAA >60 >60 >60 >60  GFRAA >60 >60 >60 >60  ANIONGAP 13  --  12 8     Hematology Recent Labs  Lab 06/30/19 0257 07/01/19 0253 07/02/19 0304  WBC 11.8* 10.4 9.6  RBC 3.89 3.50* 3.48*  HGB 11.4* 10.2* 10.2*  HCT 35.7* 31.9* 32.0*  MCV 91.8 91.1 92.0  MCH 29.3 29.1 29.3  MCHC 31.9 32.0 31.9  RDW 14.0 13.9 13.9  PLT 318 271 266    Cardiac EnzymesNo results for input(s): TROPONINI in the last 168 hours. No results for input(s): TROPIPOC in the last 168 hours.   BNPNo results for input(s): BNP, PROBNP in  the last 168 hours.   DDimer No results for input(s): DDIMER in the last 168 hours.   Radiology    Ct Lumbar Spine Wo Contrast  Result Date: 07/02/2019 CLINICAL DATA:  Fall with low back pain EXAM: CT LUMBAR SPINE WITHOUT CONTRAST TECHNIQUE: Multidetector CT imaging of the lumbar spine was performed without intravenous contrast administration. Multiplanar CT image reconstructions were also generated. COMPARISON:  02/26/2018 lumbar spine CT FINDINGS: Segmentation: Normal Alignment: Rightward listhesis of L3 on L4, unchanged. Vertebrae: Status post augmentation of T12. Compression deformity of L1 is unchanged. Paraspinal and other soft tissues: Small left pleural effusion. Extensive calcific aortic atherosclerosis. There is soft tissue gas within the left thigh, likely postoperative. There is thickening of gallbladder wall. Disc levels: No bony spinal canal stenosis. Unchanged  severe right L4-5 foraminal stenosis. IMPRESSION: 1. No acute abnormality of the lumbar spine. 2. Chronic compression deformity of L1, unchanged. 3. Status post T12 vertebral augmentation. 4.  Aortic atherosclerosis (ICD10-I70.0). 5. Small left pleural effusion. 6. Soft tissue gas within the left thighs likely postoperative. Electronically Signed   By: Ulyses Jarred M.D.   On: 07/02/2019 02:12    Cardiac Studies   none  Patient Profile     85 y.o. female admitted with a hip fracture, found to have atrial fib with a RVR.   Assessment & Plan    1. Atrial fib - her rates are not optimal but she is on a good dose of her both metoprolol and cardizem. I would not push her AV nodal blocking drugs further at this time.  2. Coags - she may start Eliquis 2.5 mg twice daily when ok with surgery and her bleeding risk is acceptable. 3. HTN - her pressure is reasonably well controlled.   For questions or updates, please contact [place] Please consult [email or web address] for contact info under Cardiology/STEMI.   Signed, Cristopher Peru, MD  07/02/2019, 12:11 PM  Patient ID: Betty Carpenter, female   DOB: [birth year], 85 y.o.   MRN: [id number]

## 2019-07-02 NOTE — Progress Notes (Signed)
Pt having pain in left hip and lower back. Given prn Norco, will monitor. Pt alert and oriented to self. Mepilex to left hip c/d/i, Purwick in place.

## 2019-07-02 NOTE — Progress Notes (Signed)
Pt resting, asleep since 0200 am. Can be easily aroused, alert and oriented to self.  Denied pain.

## 2019-07-02 NOTE — Progress Notes (Signed)
Turned patient to right - no pain in hip to palpation. Pt oriented to self, knows she is in the hospital but not which. 500cc bolus normal saline started per verbal order. Purwick in place.

## 2019-07-03 LAB — CBC
HCT: 40.3 % (ref 36.0–46.0)
Hemoglobin: 12.6 g/dL (ref 12.0–15.0)
MCH: 29.6 pg (ref 26.0–34.0)
MCHC: 31.3 g/dL (ref 30.0–36.0)
MCV: 94.6 fL (ref 80.0–100.0)
Platelets: 319 10*3/uL (ref 150–400)
RBC: 4.26 MIL/uL (ref 3.87–5.11)
RDW: 13.9 % (ref 11.5–15.5)
WBC: 9.9 10*3/uL (ref 4.0–10.5)
nRBC: 0 % (ref 0.0–0.2)

## 2019-07-03 LAB — MAGNESIUM: Magnesium: 1.9 mg/dL (ref 1.7–2.4)

## 2019-07-03 LAB — BASIC METABOLIC PANEL
Anion gap: 10 (ref 5–15)
BUN: 17 mg/dL (ref 8–23)
CO2: 25 mmol/L (ref 22–32)
Calcium: 8.8 mg/dL — ABNORMAL LOW (ref 8.9–10.3)
Chloride: 104 mmol/L (ref 98–111)
Creatinine, Ser: 0.53 mg/dL (ref 0.44–1.00)
GFR calc Af Amer: >60 mL/min (ref >60)
GFR calc non Af Amer: >60 mL/min (ref >60)
Glucose, Bld: 135 mg/dL — ABNORMAL HIGH (ref 70–99)
Potassium: 3.9 mmol/L (ref 3.5–5.1)
Sodium: 139 mmol/L (ref 135–145)

## 2019-07-03 MED ORDER — FUROSEMIDE 10 MG/ML IJ SOLN
40.0000 mg | Freq: Once | INTRAMUSCULAR | Status: AC
  Administered 2019-07-03: 40 mg via INTRAVENOUS
  Filled 2019-07-03: qty 4

## 2019-07-03 MED ORDER — DIGOXIN 125 MCG PO TABS
0.2500 mg | ORAL_TABLET | Freq: Every day | ORAL | Status: DC
  Administered 2019-07-03 – 2019-07-11 (×9): 0.25 mg via ORAL
  Filled 2019-07-03 (×9): qty 2

## 2019-07-03 NOTE — Progress Notes (Signed)
Pt resting in bed, wakes easily, reports pain in lower back but that it is "easing". Telemetry reading Afib, heart rate in the 100s. Pt now on 0.5L nasal canula, sat 96% and above, will attempt to wean.

## 2019-07-03 NOTE — Progress Notes (Signed)
Pt bathed. Pt with significant pain to L2 area of back 0 Lidocaine patch applied after bath. Area is reddened but intact. Pt turned to right side with pillow. After bath, pt noted at rest to have SpO2 88% on room air, placed back on 0.5L nasal canula. Will continue to monitor. Rate current Afib in 100s with periods of 110s or 120s but overall improved from this AM.

## 2019-07-03 NOTE — Progress Notes (Signed)
PROGRESS NOTE    Betty Carpenter  YBO:[id number] DOB: 07-05-1925 DOA: 06/28/2019 PCP: Terald Sleeper, PA-C   Brief Narrative:  85 year old female with history of her with right mastectomy, hypertension was brought to the emergency department reporting that had a fall about 4 days ago.  Patient could not remember the details of the fall.  Per daughter patient fell backwards into her buttock.  At baseline patient walks with the help of the walker.  X-ray of the hip revealed left femoral neck fracture.  Orthopedic surgery is consulted.  Patient received 7 mg of morphine with oxygen saturations dropped to 80s.  Patient was placed on nonrebreather and was given Narcan.  In the emergency department patient was found to have atrial fibrillation with rapid ventricular rate of 160 -170.  Patient does not have any previous history of atrial fibrillation.  TSH 1.2.  COVID-19 test came back to be negative.  Initial set of cardiac enzymes were negative.  Orthopedic surgery is consulted, underwent hemiarthroplasty on 06/29/2019.  Evaluated by physical therapy, recommended skilled nursing facility.  Patient continued to have atrial fibrillation with rapid ventricular rate, patient initially required Cardizem drip metoprolol.  Despite increasing Cardizem, metoprolol patient continued to have high heart rate 130s to 150s.  Given 1 dose of digoxin 0.25 mg bolus with improvement of the heart rate to 90s. Assessment & Plan:  ##Left femoral neck fracture -Patient underwent hemiarthroplasty on 06/29/2019 -Continue with pain management as needed -Physical therapy evaluation and recommended SNF -DVT prophylaxis with Eliquis  ##New onset atrial fibrillation with rapid ventricular rate -Consulted cardiology -Troponins x3 came back to be negative -Echocardiogram showed preserved EF -Off of Cardizem drip. -Currently on metoprolol 100 mg twice daily, Cardizem CD 240 mg daily, still rate is poorly controlled -Patient has chads  vasc score of 4, started on Eliquis 2.5 mg twice daily. -Patient had high heart rate of 130s to 150s while on metoprolol, Cardizem, given 0.25 mg IV bolus of digoxin with improvement of the heart rate. -Continue the digoxin 0.25 mg.   ##Acute blood loss anemia -Hemoglobin trended down to 11.4 -Closely follow-up  ##Pressure ulcer stage II -Continue with wound care  ##Protein calorie malnutrition moderate -Continue with high-protein diet  ##Acute blood loss anemia -Patient was admitted with hemoglobin of 14, trended down to 10.9  -Hemoglobin remained stable  ##Dementia -Do not have patient's baseline  ##Chronic back pain -Patient is having significant pain in the lower back -CT of the lumbar spine showed chronic L2 fracture, augmentation of T12 -Patient has significant tenderness in the L2 area, started the patient on lidocaine patch   Principal Problem:   Closed displaced fracture of left femoral neck ([place]) Active Problems:   Atrial fibrillation with rapid ventricular response (HCC)   Malnutrition of moderate degree      DVT prophylaxis: Eliquis Code Status: (Full/.   Family Communication: Patient's daughter disposition Plan: Based on PT evaluation   Consultants:   Orthopedic surgery  Cardiology  Procedures: Left hip hemiarthroplasty on 06/29/2019 Echocardiogram  Subjective: Patient states improvement with the pain after placing on lidocaine patch.  Continues to state some pain still in the lower back.  Objective: Vitals:   07/03/19 1200 07/03/19 1211 07/03/19 1230 07/03/19 1234  BP:  117/68    Pulse:  (!) 101 96 93  Resp:  16 20 (!) 22  Temp: 97.7 F (36.5 C) 98.1 F (36.7 C)    TempSrc: Oral Oral    SpO2:  93% (!) 88%  91%  Weight:      Height:        Intake/Output Summary (Last 24 hours) at 07/03/2019 1436 Last data filed at 07/03/2019 1102 Gross per 24 hour  Intake 480 ml  Output 1700 ml  Net -1220 ml   Filed Weights   06/28/19 1317  06/29/19 1743 06/30/19 0344  Weight: 56 kg 56 kg 55 kg    Examination:  General exam: Patient looks less distressed today  respiratory system: Clear to auscultation. Respiratory effort normal. Cardiovascular system: S1 & S2 heard, iRRR. No JVD, murmurs, rubs, gallops or clicks. No pedal edema. Gastrointestinal system: Abdomen is nondistended, soft and nontender. No organomegaly or masses felt. Normal bowel sounds heard. Central nervous system: Alert and oriented. No focal neurological deficits. Extremities: Significant pain in the left hip Skin: No rashes, lesions or ulcers Psychiatry: Poor judgment   Data Reviewed: I have personally reviewed following labs and imaging studies  CBC: Recent Labs  Lab 06/28/19 1432  06/29/19 2203 06/30/19 0257 07/01/19 0253 07/02/19 0304 07/03/19 1002  WBC 11.8*   < > 14.5* 11.8* 10.4 9.6 9.9  NEUTROABS 10.2*  --   --   --   --   --   --   HGB 13.0   < > 12.2 11.4* 10.2* 10.2* 12.6  HCT 41.3   < > 37.9 35.7* 31.9* 32.0* 40.3  MCV 92.0   < > 91.5 91.8 91.1 92.0 94.6  PLT 341   < > 299 318 271 266 319   < > = values in this interval not displayed.   Basic Metabolic Panel: Recent Labs  Lab 06/28/19 1432 06/28/19 2018 06/29/19 0218 06/29/19 2203 06/30/19 0257 07/01/19 0253 07/03/19 1002  NA 139  --  142  --  140 140 139  K 3.6  --  4.3  --  4.3 4.2 3.9  CL 102  --  107  --  106 109 104  CO2 24  --  22  --  22 23 25   GLUCOSE 124*  --  110*  --  147* 112* 135*  BUN 59*  --  37*  --  19 16 17   CREATININE 1.03*  --  0.76 0.61 0.61 0.50 0.53  CALCIUM 9.4  --  9.3  --  9.1 8.4* 8.8*  MG  --  2.3  --   --   --   --  1.9   GFR: Estimated Creatinine Clearance: 39 mL/min (by C-G formula based on SCr of 0.53 mg/dL). Liver Function Tests: No results for input(s): AST, ALT, ALKPHOS, BILITOT, PROT, ALBUMIN in the last 168 hours. No results for input(s): LIPASE, AMYLASE in the last 168 hours. No results for input(s): AMMONIA in the last 168  hours. Coagulation Profile: No results for input(s): INR, PROTIME in the last 168 hours. Cardiac Enzymes: No results for input(s): CKTOTAL, CKMB, CKMBINDEX, TROPONINI in the last 168 hours. BNP (last 3 results) No results for input(s): PROBNP in the last 8760 hours. HbA1C: No results for input(s): HGBA1C in the last 72 hours. CBG: Recent Labs  Lab 06/29/19 1132  GLUCAP 116*   Lipid Profile: No results for input(s): CHOL, HDL, LDLCALC, TRIG, CHOLHDL, LDLDIRECT in the last 72 hours. Thyroid Function Tests: No results for input(s): TSH, T4TOTAL, FREET4, T3FREE, THYROIDAB in the last 72 hours. Anemia Panel: No results for input(s): VITAMINB12, FOLATE, FERRITIN, TIBC, IRON, RETICCTPCT in the last 72 hours. Sepsis Labs: No results for input(s): PROCALCITON, LATICACIDVEN in the last  168 hours.  Recent Results (from the past 240 hour(s))  SARS Coronavirus 2 [facility] order, Performed in [facility] hospital lab) Nasopharyngeal Nasopharyngeal Swab     Status: None   Collection Time: 06/28/19  3:40 PM   Specimen: Nasopharyngeal Swab  Result Value Ref Range Status   SARS Coronavirus 2 NEGATIVE NEGATIVE Final    Comment: (NOTE) If result is NEGATIVE SARS-CoV-2 target nucleic acids are NOT DETECTED. The SARS-CoV-2 RNA is generally detectable in upper and lower  respiratory specimens during the acute phase of infection. The lowest  concentration of SARS-CoV-2 viral copies this assay can detect is 250  copies / mL. A negative result does not preclude SARS-CoV-2 infection  and should not be used as the sole basis for treatment or other  patient management decisions.  A negative result may occur with  improper specimen collection / handling, submission of specimen other  than nasopharyngeal swab, presence of viral mutation(s) within the  areas targeted by this assay, and inadequate number of viral copies  (<250 copies / mL). A negative result must be combined with clinical  observations,  patient history, and epidemiological information. If result is POSITIVE SARS-CoV-2 target nucleic acids are DETECTED. The SARS-CoV-2 RNA is generally detectable in upper and lower  respiratory specimens dur ing the acute phase of infection.  Positive  results are indicative of active infection with SARS-CoV-2.  Clinical  correlation with patient history and other diagnostic information is  necessary to determine patient infection status.  Positive results do  not rule out bacterial infection or co-infection with other viruses. If result is PRESUMPTIVE POSTIVE SARS-CoV-2 nucleic acids MAY BE PRESENT.   A presumptive positive result was obtained on the submitted specimen  and confirmed on repeat testing.  While 2019 novel coronavirus  (SARS-CoV-2) nucleic acids may be present in the submitted sample  additional confirmatory testing may be necessary for epidemiological  and / or clinical management purposes  to differentiate between  SARS-CoV-2 and other Sarbecovirus currently known to infect humans.  If clinically indicated additional testing with an alternate test  methodology [id number]) is advised. The SARS-CoV-2 RNA is generally  detectable in upper and lower respiratory sp ecimens during the acute  phase of infection. The expected result is Negative. Fact Sheet for Patients:  StrictlyIdeas.no Fact Sheet for Healthcare Providers: BankingDealers.co.za This test is not yet approved or cleared by the [place] FDA and has been authorized for detection and/or diagnosis of SARS-CoV-2 by FDA under an Emergency Use Authorization (EUA).  This EUA will remain in effect (meaning this test can be used) for the duration of the COVID-19 declaration under Section 564(b)(1) of the Act, 21 U.S.C. section 360bbb-3(b)(1), unless the authorization is terminated or revoked sooner. Performed at [facility], [address], [place], [place] [address]    Surgical pcr screen     Status: Abnormal   Collection Time: 06/28/19 10:45 PM   Specimen: Nasal Mucosa; Nasal Swab  Result Value Ref Range Status   MRSA, PCR NEGATIVE NEGATIVE Final   Staphylococcus aureus POSITIVE (A) NEGATIVE Final    Comment: (NOTE) The Xpert SA Assay (FDA approved for NASAL specimens in patients 81 years of age and older), is one component of a comprehensive surveillance program. It is not intended to diagnose infection nor to guide or monitor treatment. Performed at [facility], [place] [address] [place], [place] [address]          Radiology Studies: Ct Lumbar Spine Wo Contrast  Result Date: 07/02/2019 CLINICAL DATA:  Fall with low back pain EXAM: CT LUMBAR SPINE WITHOUT CONTRAST TECHNIQUE: Multidetector CT imaging of the lumbar spine was performed without intravenous contrast administration. Multiplanar CT image reconstructions were also generated. COMPARISON:  02/26/2018 lumbar spine CT FINDINGS: Segmentation: Normal Alignment: Rightward listhesis of L3 on L4, unchanged. Vertebrae: Status post augmentation of T12. Compression deformity of L1 is unchanged. Paraspinal and other soft tissues: Small left pleural effusion. Extensive calcific aortic atherosclerosis. There is soft tissue gas within the left thigh, likely postoperative. There is thickening of gallbladder wall. Disc levels: No bony spinal canal stenosis. Unchanged severe right L4-5 foraminal stenosis. IMPRESSION: 1. No acute abnormality of the lumbar spine. 2. Chronic compression deformity of L1, unchanged. 3. Status post T12 vertebral augmentation. 4.  Aortic atherosclerosis (ICD10-I70.0). 5. Small left pleural effusion. 6. Soft tissue gas within the left thighs likely postoperative. Electronically Signed   By: Ulyses Jarred M.D.   On: 07/02/2019 02:12        Scheduled Meds: . digoxin  0.25 mg Oral Daily  . diltiazem  240 mg Oral Daily  . docusate sodium  100 mg Oral BID  . enoxaparin (LOVENOX)  injection  40 mg Subcutaneous Q24H  . feeding supplement (ENSURE ENLIVE)  237 mL Oral TID BM  . lidocaine  1 patch Transdermal Q24H  . lubiprostone  24 mcg Oral BID  . metoprolol tartrate  100 mg Oral BID  . multivitamin with minerals  1 tablet Oral Daily  . mupirocin ointment  1 application Topical BID  . mupirocin ointment  1 application Topical Daily  . pantoprazole  20 mg Oral Daily   Continuous Infusions: . lactated ringers 10 mL/hr at 06/29/19 1744  . methocarbamol (ROBAXIN) IV       LOS: 5 days    Time spent: 62min    Kainan Patty, MD [facility] Pager [phone]  If 7PM-7AM, please contact night-coverage [email or web address] Password [facility] 07/03/2019, 2:36 PM

## 2019-07-03 NOTE — Progress Notes (Signed)
Progress Note  Patient Name: Betty Carpenter Date of Encounter: 07/03/2019  Primary Cardiologist: Pixie Casino, MD   Subjective   Heart rate in low 100's.  Inpatient Medications    Scheduled Meds:  digoxin  0.25 mg Oral Daily   diltiazem  240 mg Oral Daily   docusate sodium  100 mg Oral BID   enoxaparin (LOVENOX) injection  40 mg Subcutaneous Q24H   feeding supplement (ENSURE ENLIVE)  237 mL Oral TID BM   lidocaine  1 patch Transdermal Q24H   lubiprostone  24 mcg Oral BID   metoprolol tartrate  100 mg Oral BID   multivitamin with minerals  1 tablet Oral Daily   mupirocin ointment  1 application Topical BID   mupirocin ointment  1 application Topical Daily   pantoprazole  20 mg Oral Daily   Continuous Infusions:  lactated ringers 10 mL/hr at 06/29/19 1744   methocarbamol (ROBAXIN) IV     PRN Meds: acetaminophen **OR** acetaminophen, alum & mag hydroxide-simeth, HYDROcodone-acetaminophen, HYDROcodone-acetaminophen, HYDROmorphone (DILAUDID) injection, magnesium citrate, menthol-cetylpyridinium **OR** phenol, methocarbamol **OR** methocarbamol (ROBAXIN) IV, morphine injection, ondansetron **OR** ondansetron (ZOFRAN) IV, polyethylene glycol, sorbitol   Vital Signs    Vitals:   07/03/19 0031 07/03/19 0432 07/03/19 0830 07/03/19 0932  BP: 109/73 129/85 136/74   Pulse: 92 93 67 100  Resp: 16 18 18    Temp: 97.9 F (36.6 C) 98.6 F (37 C) 97.8 F (36.6 C)   TempSrc: Axillary Axillary Oral   SpO2: 97% 97% 96%   Weight:      Height:        Intake/Output Summary (Last 24 hours) at 07/03/2019 1120 Last data filed at 07/03/2019 1102 Gross per 24 hour  Intake 1118.85 ml  Output 2050 ml  Net -931.15 ml   Filed Weights   06/28/19 1317 06/29/19 1743 06/30/19 0344  Weight: 56 kg 56 kg 55 kg    Physical Exam   General: Elderly, NAD Neck: Negative for carotid bruits. No JVD Lungs:Clear to ausculation bilaterally. No wheezes, rales, or rhonchi. Breathing  is unlabored. Cardiovascular: Irregularly irregular with S1 S2. + murmur Abdomen: Soft, non-tender, non-distended. No obvious abdominal masses. Extremities: No edema. No clubbing or cyanosis. DP/PT pulses 1+ bilaterally Neuro: Alert and oriented. No focal deficits. No facial asymmetry. MAE spontaneously. Psych: Responds to questions appropriately with normal affect.    Labs    Chemistry Recent Labs  Lab 06/30/19 0257 07/01/19 0253 07/03/19 1002  NA 140 140 139  K 4.3 4.2 3.9  CL 106 109 104  CO2 22 23 25   GLUCOSE 147* 112* 135*  BUN 19 16 17   CREATININE 0.61 0.50 0.53  CALCIUM 9.1 8.4* 8.8*  GFRNONAA >60 >60 >60  GFRAA >60 >60 >60  ANIONGAP 12 8 10      Hematology Recent Labs  Lab 07/01/19 0253 07/02/19 0304 07/03/19 1002  WBC 10.4 9.6 9.9  RBC 3.50* 3.48* 4.26  HGB 10.2* 10.2* 12.6  HCT 31.9* 32.0* 40.3  MCV 91.1 92.0 94.6  MCH 29.1 29.3 29.6  MCHC 32.0 31.9 31.3  RDW 13.9 13.9 13.9  PLT 271 266 319    Cardiac EnzymesNo results for input(s): TROPONINI in the last 168 hours. No results for input(s): TROPIPOC in the last 168 hours.   BNPNo results for input(s): BNP, PROBNP in the last 168 hours.   DDimer No results for input(s): DDIMER in the last 168 hours.   Radiology    Ct Lumbar Spine Wo  Contrast  Result Date: 07/02/2019 CLINICAL DATA:  Fall with low back pain EXAM: CT LUMBAR SPINE WITHOUT CONTRAST TECHNIQUE: Multidetector CT imaging of the lumbar spine was performed without intravenous contrast administration. Multiplanar CT image reconstructions were also generated. COMPARISON:  02/26/2018 lumbar spine CT FINDINGS: Segmentation: Normal Alignment: Rightward listhesis of L3 on L4, unchanged. Vertebrae: Status post augmentation of T12. Compression deformity of L1 is unchanged. Paraspinal and other soft tissues: Small left pleural effusion. Extensive calcific aortic atherosclerosis. There is soft tissue gas within the left thigh, likely postoperative. There is  thickening of gallbladder wall. Disc levels: No bony spinal canal stenosis. Unchanged severe right L4-5 foraminal stenosis. IMPRESSION: 1. No acute abnormality of the lumbar spine. 2. Chronic compression deformity of L1, unchanged. 3. Status post T12 vertebral augmentation. 4.  Aortic atherosclerosis (ICD10-I70.0). 5. Small left pleural effusion. 6. Soft tissue gas within the left thighs likely postoperative. Electronically Signed   By: Ulyses Jarred M.D.   On: 07/02/2019 02:12   Telemetry    Afib with HR ~100- Personally Reviewed  ECG   N/A  Cardiac Studies   Echocardiogram 06/29/2019: 1. The left ventricle has normal systolic function, with an ejection fraction of 55-60%. The cavity size was normal. There is mildly increased left ventricular wall thickness. Left ventricular diastolic function could not be evaluated secondary to  atrial fibrillation. 2. The right ventricle has normal systolic function. The cavity was normal. 3. The mitral valve is abnormal. There is moderate mitral annular calcification present. Mitral valve regurgitation is moderate by color flow Doppler. 4. The tricuspid valve is grossly normal. 5. The aortic valve is tricuspid. Severe calcifcation of the aortic valve. Aortic valve regurgitation is mild by color flow Doppler. Moderate stenosis of the aortic valve. 6. The aorta is abnormal unless otherwise noted. 7. There is mild dilatation of the ascending aorta measuring 42 mm. 8. Normal LV systolic function; mild LVH; mildly dilated ascending aorta; heavily calcified aortic valve with moderate AS (mean gradient 20 mmHg; DI .29) and mild AI; moderate MR; mild TR.  Patient Profile     85 y.o. female who resides at an ALF w/ her husband,with PMH that includes breast cancer s/p mastectomy, prior LE DVT treated w/ Xarelto, h/o repeated fallsand HTN but no prior cardiac history, admitted after mechanical fall at home resulting in left femoral neck fracture. She was  found to be in new atrial fibrillation w/ RVR for which cardiology is following  Assessment & Plan    1. New onset atrial fibrillation with RVR: -Found to be in new atrial fibrillation with RVR on presentation after suffering a mechanical fall at home resulting in a femoral neck fracture. -Heart rates on presentation were in the 150 range with improvement to the 90-100s on IV diltiazem and p.o. metoprolol -CHA2DS2-VASc Score is 4 (HTN, Female, and Age >75), though anticoagulation was delayed given need for hip fracture repair and risk/benefits discussion given falls. She reports 4 falls in the past 3 months. -Increase Metoprolol to 100mg  BID -Cardizem CD to 240 QD -Apixaban 2.5mg  BID   2.  Moderate aortic stenosis/moderate mitral regurgitation:  -Per echocardiogram this admission -Continue to monitor routinely outpatient  3. HTN:  -BP more stable today, 131/82, 143/87, 129/85, 130/80 -Continue metoprolol and diltiazem as above  4. Hip fracture:  -s/p repair 06/29/2019 -Continue management per ortho  [facility] will sign off.   Medication Recommendations:  cardizem CD 240 mg daily, metoprolol 100 mg BID, Eliquis 2.5 mg BID Other recommendations (  labs, testing, etc):  none Follow up as an outpatient:  Follow-up in 3-4 weeks with APP or Dr. Debara Pickett at [facility] office  Pixie Casino, MD, [facility], [place] Director of the [place] of the [employer] Board of Clinical Lipidology Attending Cardiologist  Direct Dial: [phone]   Fax: [phone]  Website:  [email or web address]  For questions or updates, please contact   Please consult [email or web address] for contact info under Cardiology/STEMI.

## 2019-07-04 NOTE — NC FL2 (Signed)
[place] LEVEL OF CARE SCREENING TOOL     IDENTIFICATION  Patient Name: Betty Carpenter Birthdate: 11-26-1924 Sex: female Admission Date (Current Location): 06/28/2019  [place] and [place] Number:  Betty Carpenter [id number] [place] and Address:  [facility], [place] [address], [place], [place] [address]      Provider Number: [id number]  Attending Physician Name and Address:  Monica Becton, MD  Relative Name and Phone Number:  [name], Daughter, [phone]    Current Level of Care: Hospital Recommended Level of Care: [place] Prior Approval Number:    Date Approved/Denied:   PASRR Number: [id number]  Discharge Plan: SNF    Current Diagnoses: Patient Active Problem List   Diagnosis Date Noted  . Malnutrition of moderate degree 07/01/2019  . Closed displaced fracture of left femoral neck ([place]) 06/28/2019  . Atrial fibrillation with rapid ventricular response ([place]) 06/28/2019  . DDD (degenerative disc disease), lumbar 06/15/2019  . HTN (hypertension) 06/15/2019  . Hx: UTI (urinary tract infection) 06/15/2019  . Lumbar spinal stenosis 06/15/2019  . Lumbar spondylolysis 06/15/2019  . Osteoarthrosis 06/15/2019  . Venous ulcer of ankle, right ([place]) 04/07/2019  . GERD without esophagitis 03/23/2019  . Constipation due to opioid therapy 03/23/2019  . Vitamin D deficiency 03/23/2019  . S/P right hip fracture with cannulated screw placement 03/18/19   . Anxiety   . Pressure injury of skin 03/18/2019  . Closed right hip fracture ([place]) 03/17/2019  . Sinus tachycardia 03/17/2019  . Arterial leg ulcer ([place]) 11/02/2018  . Lymphedema 11/02/2018  . Closed compression fracture of thoracic vertebra ([place]) 03/15/2018  . History of breast cancer 10/23/2017  . History of DVT (deep vein thrombosis) 10/23/2017  . Chronic back pain 10/23/2017  . Benign essential HTN 10/23/2017  . Hypertensive urgency 10/23/2017  . Chronic,  continuous use of opioids 10/23/2017  . Anemia 08/18/2016  . Chronic fatigue 05/13/2016  . Pernicious anemia 05/13/2016  . Dyslipidemia 01/15/2016  . History of right mastectomy 09/17/2015  . Conjunctival hemorrhage, left eye 02/13/2015  . Mixed incontinence 11/28/2014  . Breast cancer, stage 1 ([place]) 01/28/2012    Orientation RESPIRATION BLADDER Height & Weight     Self, Situation  Normal Incontinent, External catheter Weight: 121 lb 4.1 oz (55 kg) Height:  5' 10.98" (180.3 cm)  BEHAVIORAL SYMPTOMS/MOOD NEUROLOGICAL BOWEL NUTRITION STATUS      Continent Diet(heart healthy diet, thin liquids)  AMBULATORY STATUS COMMUNICATION OF NEEDS Skin   Extensive Assist Verbally Skin abrasions, Surgical wounds, Bruising(bruising on arm/leg; pressure on ankle, heel, coccyx (dressing in place); closed incision on left leg (dressing place))                       Personal Care Assistance Level of Assistance  Bathing, Feeding, Total care, Dressing Bathing Assistance: Maximum assistance Feeding assistance: Limited assistance Dressing Assistance: Maximum assistance Total Care Assistance: Maximum assistance   Functional Limitations Info  Sight, Speech, Hearing Sight Info: Adequate Hearing Info: Adequate Speech Info: Adequate    SPECIAL CARE FACTORS FREQUENCY  PT (By licensed PT), OT (By licensed OT)     PT Frequency: 5x/wk OT Frequency: 5x/wk            Contractures Contractures Info: Not present    Additional Factors Info  Code Status, Allergies Code Status Info: Full Code Allergies Info: Cymbalta (Duloxetine Hcl)           Current Medications (07/04/2019):  This is the current  hospital active medication list Current Facility-Administered Medications  Medication Dose Route Frequency Provider Last Rate Last Dose  . acetaminophen (TYLENOL) tablet 650 mg  650 mg Oral Q6H PRN Leandrew Koyanagi, MD   650 mg at 06/29/19 1506   Or  . acetaminophen (TYLENOL) suppository 650 mg  650  mg Rectal Q6H PRN Leandrew Koyanagi, MD      . alum & mag hydroxide-simeth (MAALOX/MYLANTA) 200-200-20 MG/5ML suspension 30 mL  30 mL Oral Q4H PRN Leandrew Koyanagi, MD      . digoxin Fonnie Birkenhead) tablet 0.25 mg  0.25 mg Oral Daily Monica Becton, MD   0.25 mg at 07/04/19 0942  . diltiazem (CARDIZEM CD) 24 hr capsule 240 mg  240 mg Oral Daily Pixie Casino, MD   240 mg at 07/04/19 0942  . docusate sodium (COLACE) capsule 100 mg  100 mg Oral BID Leandrew Koyanagi, MD   100 mg at 07/04/19 3151  . enoxaparin (LOVENOX) injection 40 mg  40 mg Subcutaneous Q24H Leandrew Koyanagi, MD   40 mg at 07/04/19 0942  . feeding supplement (ENSURE ENLIVE) (ENSURE ENLIVE) liquid 237 mL  237 mL Oral TID BM Monica Becton, MD   237 mL at 07/04/19 0943  . HYDROcodone-acetaminophen (NORCO) 7.5-325 MG per tablet 1-2 tablet  1-2 tablet Oral Q4H PRN Leandrew Koyanagi, MD   2 tablet at 07/04/19 0717  . HYDROcodone-acetaminophen (NORCO/VICODIN) 5-325 MG per tablet 1-2 tablet  1-2 tablet Oral Q4H PRN Leandrew Koyanagi, MD   2 tablet at 07/01/19 2318  . HYDROmorphone (DILAUDID) injection 0.5 mg  0.5 mg Intravenous Q4H PRN Leandrew Koyanagi, MD   0.5 mg at 07/03/19 1559  . lactated ringers infusion   Intravenous Continuous Roderic Palau, MD 10 mL/hr at 06/29/19 1744    . lidocaine (LIDODERM) 5 % 1 patch  1 patch Transdermal Q24H Monica Becton, MD   1 patch at 07/04/19 1049  . lubiprostone (AMITIZA) capsule 24 mcg  24 mcg Oral BID Leandrew Koyanagi, MD   24 mcg at 07/04/19 7616  . magnesium citrate solution 1 Bottle  1 Bottle Oral Once PRN Leandrew Koyanagi, MD      . menthol-cetylpyridinium (CEPACOL) lozenge 3 mg  1 lozenge Oral PRN Leandrew Koyanagi, MD       Or  . phenol (CHLORASEPTIC) mouth spray 1 spray  1 spray Mouth/Throat PRN Leandrew Koyanagi, MD      . methocarbamol (ROBAXIN) tablet 500 mg  500 mg Oral Q6H PRN Leandrew Koyanagi, MD   500 mg at 07/04/19 0737   Or  . methocarbamol (ROBAXIN) 500 mg in dextrose 5 % 50 mL IVPB  500 mg Intravenous Q6H  PRN Leandrew Koyanagi, MD      . metoprolol tartrate (LOPRESSOR) tablet 100 mg  100 mg Oral BID Kathyrn Drown D, NP   100 mg at 07/04/19 0942  . morphine 2 MG/ML injection 1 mg  1 mg Intravenous Q2H PRN Leandrew Koyanagi, MD   1 mg at 07/01/19 1443  . multivitamin with minerals tablet 1 tablet  1 tablet Oral Daily Leandrew Koyanagi, MD   1 tablet at 07/04/19 [phone]  . mupirocin ointment (BACTROBAN) 2 % 1 application  1 application Topical Daily Leandrew Koyanagi, MD   1 application at 69/48/54 1050  . ondansetron (ZOFRAN) tablet 4 mg  4 mg Oral Q6H PRN Leandrew Koyanagi, MD       Or  .  ondansetron (ZOFRAN) injection 4 mg  4 mg Intravenous Q6H PRN Leandrew Koyanagi, MD      . pantoprazole (PROTONIX) EC tablet 20 mg  20 mg Oral Daily Leandrew Koyanagi, MD   20 mg at 07/04/19 0942  . polyethylene glycol (MIRALAX / GLYCOLAX) packet 17 g  17 g Oral Daily PRN Leandrew Koyanagi, MD      . sorbitol 70 % solution 30 mL  30 mL Oral Daily PRN Leandrew Koyanagi, MD         Discharge Medications: Please see discharge summary for a list of discharge medications.  Relevant Imaging Results:  Relevant Lab Results:   Additional Information SSN: [id number]  Philippa Chester Shamona Wirtz, LCSWA

## 2019-07-04 NOTE — Progress Notes (Signed)
PROGRESS NOTE    Betty Carpenter  FBP:[id number] DOB: 1925-09-23 DOA: 06/28/2019 PCP: Terald Sleeper, PA-C   Brief Narrative:  85 year old female with history of her with right mastectomy, hypertension was brought to the emergency department reporting that had a fall about 4 days ago.  Patient could not remember the details of the fall.  Per daughter patient fell backwards into her buttock.  At baseline patient walks with the help of the walker.  X-ray of the hip revealed left femoral neck fracture.  Orthopedic surgery is consulted.  Patient received 7 mg of morphine with oxygen saturations dropped to 80s.  Patient was placed on nonrebreather and was given Narcan.  In the emergency department patient was found to have atrial fibrillation with rapid ventricular rate of 160 -170.  Patient does not have any previous history of atrial fibrillation.  TSH 1.2.  COVID-19 test came back to be negative.  Initial set of cardiac enzymes were negative.  Orthopedic surgery is consulted, underwent hemiarthroplasty on 06/29/2019.  Evaluated by physical therapy, recommended skilled nursing facility.  Patient continued to have atrial fibrillation with rapid ventricular rate, patient initially required Cardizem drip metoprolol.  Despite increasing Cardizem, metoprolol patient continued to have high heart rate 130s to 150s.  Given 1 dose of digoxin 0.25 mg bolus with improvement of the heart rate to 90s. Assessment & Plan:  ##Left femoral neck fracture -Patient underwent hemiarthroplasty on 06/29/2019 -Continue with pain management as needed -Physical therapy evaluation and recommended SNF -DVT prophylaxis with Eliquis  ##New onset atrial fibrillation with rapid ventricular rate -Appreciate cardiology follow-up -Troponins x3 came back to be negative -Echocardiogram showed preserved EF -Off of Cardizem drip. -Currently on metoprolol 100 mg twice daily, Cardizem CD 240 mg daily, digoxin, still heart rate is not controlled  -Patient has chads vasc score of 4, started on Eliquis 2.5 mg twice daily. -Patient had high heart rate of 130s to 150s while on metoprolol, Cardizem, digoxin.   ##Acute blood loss anemia -Hemoglobin trended down to 11.4 -Closely follow-up  ##Pressure ulcer stage II -Continue with wound care  ##Protein calorie malnutrition moderate -Continue with high-protein diet  ##Acute blood loss anemia -Patient was admitted with hemoglobin of 14, trended down to 10.9  -Hemoglobin remained stable  ##Dementia -Do not have patient's baseline  ##Chronic back pain -Patient is having significant pain in the lower back -CT of the lumbar spine showed chronic L2 fracture, augmentation of T12 -Patient has significant tenderness in the L2 area, started the patient on lidocaine patch with mild improvement   Principal Problem:   Closed displaced fracture of left femoral neck ([place]) Active Problems:   Atrial fibrillation with rapid ventricular response (HCC)   Malnutrition of moderate degree      DVT prophylaxis: Eliquis Code Status: (Full/.   Family Communication: Patient's daughter disposition Plan: Based on PT evaluation   Consultants:   Orthopedic surgery  Cardiology  Procedures: Left hip hemiarthroplasty on 06/29/2019 Echocardiogram  Subjective: Patient states improvement with the pain after placing on lidocaine patch.  Continues to state some pain still in the lower back.  Objective: Vitals:   07/04/19 0005 07/04/19 0347 07/04/19 0807 07/04/19 1054  BP:  130/81 121/71 (!) 103/57  Pulse:  95 98 (!) 125  Resp:  16 18 20   Temp: 98 F (36.7 C) 97.8 F (36.6 C)  98.3 F (36.8 C)  TempSrc: Oral Axillary  Axillary  SpO2:  94% 97% 97%  Weight:      Height:  Intake/Output Summary (Last 24 hours) at 07/04/2019 1400 Last data filed at 07/04/2019 1258 Gross per 24 hour  Intake 604 ml  Output 300 ml  Net 304 ml   Filed Weights   06/28/19 1317 06/29/19 1743 06/30/19  0344  Weight: 56 kg 56 kg 55 kg    Examination:  General exam: Patient looks in moderate distress due to back pain respiratory system: Clear to auscultation. Respiratory effort normal. Cardiovascular system: S1 & S2 heard, iRRR. No JVD, murmurs, rubs, gallops or clicks. No pedal edema. Gastrointestinal system: Abdomen is nondistended, soft and nontender. No organomegaly or masses felt. Normal bowel sounds heard. Central nervous system: Alert and oriented. No focal neurological deficits. Extremities: Significant pain in the left hip Skin: No rashes, lesions or ulcers Psychiatry: Poor judgment   Data Reviewed: I have personally reviewed following labs and imaging studies  CBC: Recent Labs  Lab 06/28/19 1432  06/29/19 2203 06/30/19 0257 07/01/19 0253 07/02/19 0304 07/03/19 1002  WBC 11.8*   < > 14.5* 11.8* 10.4 9.6 9.9  NEUTROABS 10.2*  --   --   --   --   --   --   HGB 13.0   < > 12.2 11.4* 10.2* 10.2* 12.6  HCT 41.3   < > 37.9 35.7* 31.9* 32.0* 40.3  MCV 92.0   < > 91.5 91.8 91.1 92.0 94.6  PLT 341   < > 299 318 271 266 319   < > = values in this interval not displayed.   Basic Metabolic Panel: Recent Labs  Lab 06/28/19 1432 06/28/19 2018 06/29/19 0218 06/29/19 2203 06/30/19 0257 07/01/19 0253 07/03/19 1002  NA 139  --  142  --  140 140 139  K 3.6  --  4.3  --  4.3 4.2 3.9  CL 102  --  107  --  106 109 104  CO2 24  --  22  --  22 23 25   GLUCOSE 124*  --  110*  --  147* 112* 135*  BUN 59*  --  37*  --  19 16 17   CREATININE 1.03*  --  0.76 0.61 0.61 0.50 0.53  CALCIUM 9.4  --  9.3  --  9.1 8.4* 8.8*  MG  --  2.3  --   --   --   --  1.9   GFR: Estimated Creatinine Clearance: 39 mL/min (by C-G formula based on SCr of 0.53 mg/dL). Liver Function Tests: No results for input(s): AST, ALT, ALKPHOS, BILITOT, PROT, ALBUMIN in the last 168 hours. No results for input(s): LIPASE, AMYLASE in the last 168 hours. No results for input(s): AMMONIA in the last 168 hours.  Coagulation Profile: No results for input(s): INR, PROTIME in the last 168 hours. Cardiac Enzymes: No results for input(s): CKTOTAL, CKMB, CKMBINDEX, TROPONINI in the last 168 hours. BNP (last 3 results) No results for input(s): PROBNP in the last 8760 hours. HbA1C: No results for input(s): HGBA1C in the last 72 hours. CBG: Recent Labs  Lab 06/29/19 1132  GLUCAP 116*   Lipid Profile: No results for input(s): CHOL, HDL, LDLCALC, TRIG, CHOLHDL, LDLDIRECT in the last 72 hours. Thyroid Function Tests: No results for input(s): TSH, T4TOTAL, FREET4, T3FREE, THYROIDAB in the last 72 hours. Anemia Panel: No results for input(s): VITAMINB12, FOLATE, FERRITIN, TIBC, IRON, RETICCTPCT in the last 72 hours. Sepsis Labs: No results for input(s): PROCALCITON, LATICACIDVEN in the last 168 hours.  Recent Results (from the past 240 hour(s))  SARS Coronavirus  2 [facility] order, Performed in [facility] hospital lab) Nasopharyngeal Nasopharyngeal Swab     Status: None   Collection Time: 06/28/19  3:40 PM   Specimen: Nasopharyngeal Swab  Result Value Ref Range Status   SARS Coronavirus 2 NEGATIVE NEGATIVE Final    Comment: (NOTE) If result is NEGATIVE SARS-CoV-2 target nucleic acids are NOT DETECTED. The SARS-CoV-2 RNA is generally detectable in upper and lower  respiratory specimens during the acute phase of infection. The lowest  concentration of SARS-CoV-2 viral copies this assay can detect is 250  copies / mL. A negative result does not preclude SARS-CoV-2 infection  and should not be used as the sole basis for treatment or other  patient management decisions.  A negative result may occur with  improper specimen collection / handling, submission of specimen other  than nasopharyngeal swab, presence of viral mutation(s) within the  areas targeted by this assay, and inadequate number of viral copies  (<250 copies / mL). A negative result must be combined with clinical  observations, patient  history, and epidemiological information. If result is POSITIVE SARS-CoV-2 target nucleic acids are DETECTED. The SARS-CoV-2 RNA is generally detectable in upper and lower  respiratory specimens dur ing the acute phase of infection.  Positive  results are indicative of active infection with SARS-CoV-2.  Clinical  correlation with patient history and other diagnostic information is  necessary to determine patient infection status.  Positive results do  not rule out bacterial infection or co-infection with other viruses. If result is PRESUMPTIVE POSTIVE SARS-CoV-2 nucleic acids MAY BE PRESENT.   A presumptive positive result was obtained on the submitted specimen  and confirmed on repeat testing.  While 2019 novel coronavirus  (SARS-CoV-2) nucleic acids may be present in the submitted sample  additional confirmatory testing may be necessary for epidemiological  and / or clinical management purposes  to differentiate between  SARS-CoV-2 and other Sarbecovirus currently known to infect humans.  If clinically indicated additional testing with an alternate test  methodology [phone]) is advised. The SARS-CoV-2 RNA is generally  detectable in upper and lower respiratory sp ecimens during the acute  phase of infection. The expected result is Negative. Fact Sheet for Patients:  StrictlyIdeas.no Fact Sheet for Healthcare Providers: BankingDealers.co.za This test is not yet approved or cleared by the [place] FDA and has been authorized for detection and/or diagnosis of SARS-CoV-2 by FDA under an Emergency Use Authorization (EUA).  This EUA will remain in effect (meaning this test can be used) for the duration of the COVID-19 declaration under Section 564(b)(1) of the Act, 21 U.S.C. section 360bbb-3(b)(1), unless the authorization is terminated or revoked sooner. Performed at [facility], [address], [place], [place] [address]   Surgical  pcr screen     Status: Abnormal   Collection Time: 06/28/19 10:45 PM   Specimen: Nasal Mucosa; Nasal Swab  Result Value Ref Range Status   MRSA, PCR NEGATIVE NEGATIVE Final   Staphylococcus aureus POSITIVE (A) NEGATIVE Final    Comment: (NOTE) The Xpert SA Assay (FDA approved for NASAL specimens in patients 42 years of age and older), is one component of a comprehensive surveillance program. It is not intended to diagnose infection nor to guide or monitor treatment. Performed at [facility], [place] [address] [place], [place] [address]          Radiology Studies: No results found.      Scheduled Meds: . digoxin  0.25 mg Oral Daily  .  diltiazem  240 mg Oral Daily  . docusate sodium  100 mg Oral BID  . enoxaparin (LOVENOX) injection  40 mg Subcutaneous Q24H  . feeding supplement (ENSURE ENLIVE)  237 mL Oral TID BM  . lidocaine  1 patch Transdermal Q24H  . lubiprostone  24 mcg Oral BID  . metoprolol tartrate  100 mg Oral BID  . multivitamin with minerals  1 tablet Oral Daily  . mupirocin ointment  1 application Topical Daily  . pantoprazole  20 mg Oral Daily   Continuous Infusions: . lactated ringers 10 mL/hr at 06/29/19 1744  . methocarbamol (ROBAXIN) IV       LOS: 6 days    Time spent: 73min    Reginal Wojcicki, MD [facility] Pager [phone]  If 7PM-7AM, please contact night-coverage [email or web address] Password [email or web address] 07/04/2019, 2:00 PM

## 2019-07-04 NOTE — TOC Initial Note (Signed)
Transition of Care [facility]) - Initial/Assessment Note    Patient Details  Name: Betty Carpenter MRN: [id number] Date of Birth: Aug 01, 1925  [facility]) CM/SW Contact:    Gelene Mink, [place] Phone Number: 07/04/2019, 12:47 PM  Clinical Narrative:                  CSW called and spoke with the patient's daughter, [name]. CSW introduced herself and explained her role. CSW shared the therapy recommendation. Enid Derry stated that her mother has been to rehab before at the [facility]. She stated she would like to try to get her mother into [facility] for rehab. She stated that [facility] is closer to her home. CSW asked if she would be okay with the [facility] as a second choice, she agreed. CSW obtained permission to fax the patient out. CSW instructed Enid Derry that she would call with updated bed offers.   CSW will continue to follow.    Expected Discharge Plan: Skilled Nursing Facility Barriers to Discharge: Continued Medical Work up   Patient Goals and CMS Choice Patient states their goals for this hospitalization and ongoing recovery are:: Pt daughter is agreeable to rehab CMS Medicare.gov Compare Post Acute Care list provided to:: Patient Represenative (must comment) Choice offered to / list presented to : Adult Children  Expected Discharge Plan and Services Expected Discharge Plan: [place] In-house Referral: Clinical Social Work Discharge Planning Services: NA Post Acute Care Choice: [place] Living arrangements for the past 2 months: Single Family Home                 DME Arranged: N/A DME Agency: NA       HH Arranged: NA [place] Agency: NA        Prior Living Arrangements/Services Living arrangements for the past 2 months: [place] Lives with:: Adult Children Patient language and need for interpreter reviewed:: No Do you feel safe going back to the place where you live?: Yes      Need for Family  Participation in Patient Care: Yes (Comment) Care giver support system in place?: Yes (comment)   Criminal Activity/Legal Involvement Pertinent to Current Situation/Hospitalization: No - Comment as needed  Activities of Daily Living      Permission Sought/Granted Permission sought to share information with : Case Manager Permission granted to share information with : Yes, Verbal Permission Granted  Share Information with NAME: Enid Derry  Permission granted to share info w AGENCY: All SNF  Permission granted to share info w Relationship: Daughter     Emotional Assessment Appearance:: Appears stated age Attitude/Demeanor/Rapport: Unable to Assess Affect (typically observed): Unable to Assess Orientation: : Oriented to Self, Oriented to Situation Alcohol / Substance Use: Not Applicable Psych Involvement: No (comment)  Admission diagnosis:  Atrial fibrillation with RVR ([place]) [I48.91] Fall, initial encounter [W19.XXXA] Closed left hip fracture, initial encounter ([place]) [S72.002A] Traumatic compression fracture of T11 thoracic vertebra, closed, initial encounter [facility]) [G38.756E] Patient Active Problem List   Diagnosis Date Noted  . Malnutrition of moderate degree 07/01/2019  . Closed displaced fracture of left femoral neck ([place]) 06/28/2019  . Atrial fibrillation with rapid ventricular response ([place]) 06/28/2019  . DDD (degenerative disc disease), lumbar 06/15/2019  . HTN (hypertension) 06/15/2019  . Hx: UTI (urinary tract infection) 06/15/2019  . Lumbar spinal stenosis 06/15/2019  . Lumbar spondylolysis 06/15/2019  . Osteoarthrosis 06/15/2019  . Venous ulcer of ankle, right ([place]) 04/07/2019  . GERD without esophagitis  03/23/2019  . Constipation due to opioid therapy 03/23/2019  . Vitamin D deficiency 03/23/2019  . S/P right hip fracture with cannulated screw placement 03/18/19   . Anxiety   . Pressure injury of skin 03/18/2019  . Closed right hip fracture ([place]) 03/17/2019  . Sinus  tachycardia 03/17/2019  . Arterial leg ulcer ([place]) 11/02/2018  . Lymphedema 11/02/2018  . Closed compression fracture of thoracic vertebra ([place]) 03/15/2018  . History of breast cancer 10/23/2017  . History of DVT (deep vein thrombosis) 10/23/2017  . Chronic back pain 10/23/2017  . Benign essential HTN 10/23/2017  . Hypertensive urgency 10/23/2017  . Chronic, continuous use of opioids 10/23/2017  . Anemia 08/18/2016  . Chronic fatigue 05/13/2016  . Pernicious anemia 05/13/2016  . Dyslipidemia 01/15/2016  . History of right mastectomy 09/17/2015  . Conjunctival hemorrhage, left eye 02/13/2015  . Mixed incontinence 11/28/2014  . Breast cancer, stage 1 ([place]) 01/28/2012   PCP:  Terald Sleeper, PA-C Pharmacy:   [facility] - [address] [place], [place] [address] Phone: [phone] Fax: [phone]  [place], [place] [address] [place] [address] Phone: [phone] Fax: [phone]     Social Determinants of Health (SDOH) Interventions    Readmission Risk Interventions Readmission Risk Prevention Plan 03/22/2019  Transportation Screening Complete  PCP or Specialist Appt within 5-7 Days Complete  Home Care Screening Complete  Medication Review (RN CM) Complete  Some recent data might be hidden

## 2019-07-05 LAB — BASIC METABOLIC PANEL
Anion gap: 12 (ref 5–15)
BUN: 16 mg/dL (ref 8–23)
CO2: 28 mmol/L (ref 22–32)
Calcium: 9.1 mg/dL (ref 8.9–10.3)
Chloride: 96 mmol/L — ABNORMAL LOW (ref 98–111)
Creatinine, Ser: 0.6 mg/dL (ref 0.44–1.00)
GFR calc Af Amer: >60 mL/min (ref >60)
GFR calc non Af Amer: >60 mL/min (ref >60)
Glucose, Bld: 165 mg/dL — ABNORMAL HIGH (ref 70–99)
Potassium: 4.2 mmol/L (ref 3.5–5.1)
Sodium: 136 mmol/L (ref 135–145)

## 2019-07-05 LAB — CBC WITH DIFFERENTIAL/PLATELET
Abs Immature Granulocytes: 0.24 10*3/uL — ABNORMAL HIGH (ref 0.00–0.07)
Basophils Absolute: 0.1 10*3/uL (ref 0.0–0.1)
Basophils Relative: 1 %
Eosinophils Absolute: 0 10*3/uL (ref 0.0–0.5)
Eosinophils Relative: 0 %
HCT: 39.6 % (ref 36.0–46.0)
Hemoglobin: 12.6 g/dL (ref 12.0–15.0)
Immature Granulocytes: 3 %
Lymphocytes Relative: 7 %
Lymphs Abs: 0.7 10*3/uL (ref 0.7–4.0)
MCH: 29.2 pg (ref 26.0–34.0)
MCHC: 31.8 g/dL (ref 30.0–36.0)
MCV: 91.9 fL (ref 80.0–100.0)
Monocytes Absolute: 0.6 10*3/uL (ref 0.1–1.0)
Monocytes Relative: 7 %
Neutro Abs: 8.1 10*3/uL — ABNORMAL HIGH (ref 1.7–7.7)
Neutrophils Relative %: 82 %
Platelets: 352 10*3/uL (ref 150–400)
RBC: 4.31 MIL/uL (ref 3.87–5.11)
RDW: 13.9 % (ref 11.5–15.5)
WBC: 9.7 10*3/uL (ref 4.0–10.5)
nRBC: 0 % (ref 0.0–0.2)

## 2019-07-05 LAB — MAGNESIUM: Magnesium: 1.9 mg/dL (ref 1.7–2.4)

## 2019-07-05 MED ORDER — DILTIAZEM HCL ER COATED BEADS 180 MG PO CP24
300.0000 mg | ORAL_CAPSULE | Freq: Every day | ORAL | Status: DC
  Administered 2019-07-05 – 2019-07-09 (×5): 300 mg via ORAL
  Filled 2019-07-05 (×5): qty 1

## 2019-07-05 MED ORDER — DILTIAZEM HCL ER COATED BEADS 180 MG PO CP24: 300.0000 mg | ORAL_CAPSULE | Freq: Every day | ORAL | Status: DC

## 2019-07-05 NOTE — Progress Notes (Signed)
Physical Therapy Treatment Patient Details Name: Betty Carpenter MRN: [id number] DOB: [birth year] Today's Date: 07/05/2019    History of Present Illness 85 y.o. female with medical history significant for breast cancer with right mastectomy, hypertension, R hip fx and surgery 03/17/19 who was brought to the ED 8/11 with reports of a fall 8/7. X-ray revealed L femoral hip fx pt also with new onset of A fib with RVR transferred to [facility] for treatment. L anterior approach hemi hip arthroplasty 06/29/19.      PT Comments    Patient seen for mobility progression. Pt requires max A - total A +2 for bed mobility. Pt able to maintain sitting balance EOB with min guard assist but only able to tolerate briefly due to pain level.  Continue to progress as tolerated with anticipated d/c to SNF for further skilled PT services.    Follow Up Recommendations  SNF;Supervision/Assistance - 24 hour     Equipment Recommendations  None recommended by PT    Recommendations for Other Services       Precautions / Restrictions Precautions Precautions: Fall Precaution Comments: hip fx result of fall  Restrictions Weight Bearing Restrictions: Yes LLE Weight Bearing: Weight bearing as tolerated    Mobility  Bed Mobility Overal bed mobility: Needs Assistance Bed Mobility: Supine to Sit;Sit to Supine;Rolling Rolling: Max assist   Supine to sit: Total assist;+2 for physical assistance Sit to supine: Total assist;+2 for physical assistance   General bed mobility comments: +2 for pt comfort; cues for sequencing with pt able to bring R LE to EOB and reach to R bed rail; assist to bring hips to EOB with bed pad and to elevate trunk into sitting; pt is able to roll with max A R and L with use of bed rail however unable to get into side lying on L side due to painful hip  Transfers                 General transfer comment: deferred due to pt's pain level and pt attempting to lay back down several times  while sitting EOB  Ambulation/Gait                 Stairs             Wheelchair Mobility    Modified Rankin (Stroke Patients Only)       Balance Overall balance assessment: Needs assistance Sitting-balance support: Feet supported;Single extremity supported;Bilateral upper extremity supported Sitting balance-Leahy Scale: Poor Sitting balance - Comments: able to progress to min guard for balance from initial max A to maintain balance.                                     Cognition Arousal/Alertness: Awake/alert Behavior During Therapy: WFL for tasks assessed/performed Overall Cognitive Status: History of cognitive impairments - at baseline                                 General Comments: pt recalls she is in the hops[place] but does not know the name or what city we are in       Exercises      General Comments        Pertinent Vitals/Pain Pain Assessment: Faces Faces Pain Scale: Hurts whole lot Pain Location: L hip Pain Descriptors / Indicators: Grimacing;Guarding;Moaning Pain Intervention(s): Limited  activity within patient's tolerance;Monitored during session;Repositioned;Patient requesting pain meds-RN notified    Home Living                      Prior Function            PT Goals (current goals can now be found in the care plan section) Progress towards PT goals: Progressing toward goals    Frequency    Min 2X/week      PT Plan Current plan remains appropriate    Co-evaluation              AM-PAC PT "6 Clicks" Mobility   Outcome Measure  Help needed turning from your back to your side while in a flat bed without using bedrails?: A Lot Help needed moving from lying on your back to sitting on the side of a flat bed without using bedrails?: Total Help needed moving to and from a bed to a chair (including a wheelchair)?: Total Help needed standing up from a chair using your arms (e.g.,  wheelchair or bedside chair)?: Total Help needed to walk in hospital room?: Total Help needed climbing 3-5 steps with a railing? : Total 6 Click Score: 7    End of Session   Activity Tolerance: Patient limited by pain Patient left: with call bell/phone within reach;in bed;with bed alarm set;with SCD's reapplied Nurse Communication: Mobility status;Patient requests pain meds PT Visit Diagnosis: Unsteadiness on feet (R26.81);Repeated falls (R29.6);History of falling (Z91.81);Difficulty in walking, not elsewhere classified (R26.2);Pain;Muscle weakness (generalized) (M62.81);Other abnormalities of gait and mobility (R26.89) Pain - Right/Left: Left Pain - part of body: Hip     Time: 1173-5670 PT Time Calculation (min) (ACUTE ONLY): 30 min  Charges:  $Therapeutic Activity: 23-37 mins                     Earney Navy, PTA Acute Rehabilitation Services Pager: [phone] Office: [phone]     Darliss Cheney 07/05/2019, 4:27 PM

## 2019-07-05 NOTE — Progress Notes (Signed)
Nutrition Follow-up  RD working remotely.  DOCUMENTATION CODES:   Non-severe (moderate) malnutrition in context of chronic illness, Underweight  INTERVENTION:   -Continue MVI with minerals daily -Continue Ensure Enlive po to TID, each supplement provides 350 kcal and 20 grams of protein -Continue Magic cup TID with meals, each supplement provides 290 kcal and 9 grams of protein  NUTRITION DIAGNOSIS:   Moderate Malnutrition related to chronic illness(dementia) as evidenced by mild fat depletion, moderate fat depletion, mild muscle depletion, moderate muscle depletion.  Ongoing  GOAL:   Patient will meet greater than or equal to 90% of their needs  Progressing   MONITOR:   PO intake, Supplement acceptance, Labs, Weight trends, Skin, I & O's  REASON FOR ASSESSMENT:   Other (Comment)    ASSESSMENT:   Betty Carpenter is a 85 y.o. female with medical history significant for breast cancer with right mastectomy, hypertension, who was brought to the ED with reports of a fall 4 days ago, 8/7.  Daughter was present at bedside.  Patient cannot remember details of the fall, but reports she fell backwards onto her buttock/lower back.  She denies hitting her head.  At baseline she ambulates with a wheelchair.  She got herself up onto her wheelchair, but then has not been able to transfer from bed to chair as she usually does.  She denies chest pain or difficulty breathing.  No history of irregular heart rhythm.  8/12- s/p Prosthetic replacement for femoral neck fracture  Reviewed I/O's: -116 ml x 24 hours and +827 ml since admission  UOP: 600 ml x 24 hours   Pt's intake is variable, but improved since last visit. Noted documented meal completion 35-60%. Pt is accepting Ensure supplements per MAR.   Wt has been stable since admission.  Per CSW notes, pt awaiting SNF placement for discharge.   Labs reviewed.   Diet Order:   Diet Order            Diet Heart Room service appropriate?  No; Fluid consistency: Thin  Diet effective now              EDUCATION NEEDS:   No education needs have been identified at this time  Skin:  Skin Assessment: Skin Integrity Issues: Skin Integrity Issues:: Stage II, Other (Comment) Stage II: coccyx Other: callous to lt anterior plantar foot; chronic full thickness wounds to rt outer ankle and lt inner heel  Last BM:  07/04/19  Height:   Ht Readings from Last 1 Encounters:  06/29/19 5' 10.98" (1.803 m)    Weight:   Wt Readings from Last 1 Encounters:  06/30/19 55 kg    Ideal Body Weight:  70.5 kg  BMI:  Body mass index is 16.92 kg/m.  Estimated Nutritional Needs:   Kcal:  1600-1800  Protein:  75-90 grams  Fluid:  > 1.6 L    Jaysun Wessels A. Jimmye Norman, RD, LDN, [place] Registered Dietitian II Certified Diabetes Care and Education Specialist Pager: [phone] After hours Pager: [phone]

## 2019-07-05 NOTE — Progress Notes (Signed)
PROGRESS NOTE    Betty Carpenter  SLH:[id number] DOB: 30-Nov-1924 DOA: 06/28/2019 PCP: Terald Sleeper, PA-C   Brief Narrative:  85 year old female with history of her with right mastectomy, hypertension was brought to the emergency department reporting that had a fall about 4 days ago.  Patient could not remember the details of the fall.  Per daughter patient fell backwards into her buttock.  At baseline patient walks with the help of the walker.  X-ray of the hip revealed left femoral neck fracture.  Orthopedic surgery is consulted.  Patient received 7 mg of morphine with oxygen saturations dropped to 80s.  Patient was placed on nonrebreather and was given Narcan.  In the emergency department patient was found to have atrial fibrillation with rapid ventricular rate of 160 -170.  Patient does not have any previous history of atrial fibrillation.  TSH 1.2.  COVID-19 test came back to be negative.  Initial set of cardiac enzymes were negative.  Orthopedic surgery is consulted, underwent hemiarthroplasty on 06/29/2019.  Evaluated by physical therapy, recommended skilled nursing facility.  Patient continued to have atrial fibrillation with rapid ventricular rate, patient initially required Cardizem drip metoprolol.  Despite increasing Cardizem, metoprolol patient continued to have high heart rate 130s to 150s.  Given 1 dose of digoxin 0.25 mg bolus with improvement of the heart rate to 90s on 07/03/2019.  Assessment & Plan:  ##Left femoral neck fracture -Patient underwent hemiarthroplasty on 06/29/2019 -Continue with pain management as needed -Physical therapy evaluation and recommended SNF -DVT prophylaxis with Eliquis  ##New onset atrial fibrillation with rapid ventricular rate -Appreciate cardiology follow-up -Troponins x3 came back to be negative -Echocardiogram showed preserved EF -Off of Cardizem drip. -Currently on metoprolol 100 mg twice daily, Cardizem CD 240 mg daily, digoxin, still heart rate is  not controlled.  Will increase Cardizem to 300 mg.  Continue rest of the medication and monitor on telemetry. -Patient has chads vasc score of 4, started on Eliquis 2.5 mg twice daily   ##Acute blood loss anemia: Very mild drop.  Lowest was 10.2.  Jumped back to 12.6 on its own.  ##Pressure ulcer stage II -Continue with wound care  ##Protein calorie malnutrition moderate -Continue with high-protein diet  ##Dementia -Do not have patient's baseline  ##Chronic back pain -Patient is having significant pain in the lower back -CT of the lumbar spine showed chronic L2 fracture, augmentation of T12 -Patient has significant tenderness in the L2 area, started the patient on lidocaine patch with mild improvement.   Principal Problem:   Closed displaced fracture of left femoral neck (HCC) Active Problems:   Atrial fibrillation with rapid ventricular response (HCC)   Malnutrition of moderate degree   DVT prophylaxis: Eliquis Code Status: (Full/.   Family Communication: None present at bedside. disposition Plan: Needs SNF.  Pending placement.   Consultants:   Orthopedic surgery  Cardiology  Procedures: Left hip hemiarthroplasty on 06/29/2019 Echocardiogram  Subjective: Patient seen and examined.  Only complaint she had is low back pain.  This is going on for a long time.  She rates it as 8 out of 10.  No other complaint.  Objective: Vitals:   07/05/19 0052 07/05/19 0252 07/05/19 0452 07/05/19 0506  BP: 127/81 123/86 136/84 136/84  Pulse: 96 94 99   Resp: (!) 24 (!) 27 20   Temp:    98.3 F (36.8 C)  TempSrc:    Oral  SpO2: 96% 97% 97%   Weight:  Height:        Intake/Output Summary (Last 24 hours) at 07/05/2019 1016 Last data filed at 07/04/2019 1935 Gross per 24 hour  Intake 244 ml  Output 600 ml  Net -356 ml   Filed Weights   06/28/19 1317 06/29/19 1743 06/30/19 0344  Weight: 56 kg 56 kg 55 kg    Examination:  General exam: Appears calm and comfortable  but in moderate pain. Respiratory system: Clear to auscultation. Respiratory effort normal. Cardiovascular system: S1 & S2 heard, RRR. No JVD, murmurs, rubs, gallops or clicks. No pedal edema. Gastrointestinal system: Abdomen is nondistended, soft and nontender. No organomegaly or masses felt. Normal bowel sounds heard. Central nervous system: Alert and oriented. No focal neurological deficits. Extremities: Symmetric 5 x 5 power. Skin: No rashes, lesions or ulcers Psychiatry: Judgement and insight appear normal. Mood & affect appropriate.     Data Reviewed: I have personally reviewed following labs and imaging studies  CBC: Recent Labs  Lab 06/28/19 1432  06/29/19 2203 06/30/19 0257 07/01/19 0253 07/02/19 0304 07/03/19 1002  WBC 11.8*   < > 14.5* 11.8* 10.4 9.6 9.9  NEUTROABS 10.2*  --   --   --   --   --   --   HGB 13.0   < > 12.2 11.4* 10.2* 10.2* 12.6  HCT 41.3   < > 37.9 35.7* 31.9* 32.0* 40.3  MCV 92.0   < > 91.5 91.8 91.1 92.0 94.6  PLT 341   < > 299 318 271 266 319   < > = values in this interval not displayed.   Basic Metabolic Panel: Recent Labs  Lab 06/28/19 1432 06/28/19 2018 06/29/19 0218 06/29/19 2203 06/30/19 0257 07/01/19 0253 07/03/19 1002  NA 139  --  142  --  140 140 139  K 3.6  --  4.3  --  4.3 4.2 3.9  CL 102  --  107  --  106 109 104  CO2 24  --  22  --  22 23 25   GLUCOSE 124*  --  110*  --  147* 112* 135*  BUN 59*  --  37*  --  19 16 17   CREATININE 1.03*  --  0.76 0.61 0.61 0.50 0.53  CALCIUM 9.4  --  9.3  --  9.1 8.4* 8.8*  MG  --  2.3  --   --   --   --  1.9   GFR: Estimated Creatinine Clearance: 39 mL/min (by C-G formula based on SCr of 0.53 mg/dL). Liver Function Tests: No results for input(s): AST, ALT, ALKPHOS, BILITOT, PROT, ALBUMIN in the last 168 hours. No results for input(s): LIPASE, AMYLASE in the last 168 hours. No results for input(s): AMMONIA in the last 168 hours. Coagulation Profile: No results for input(s): INR, PROTIME  in the last 168 hours. Cardiac Enzymes: No results for input(s): CKTOTAL, CKMB, CKMBINDEX, TROPONINI in the last 168 hours. BNP (last 3 results) No results for input(s): PROBNP in the last 8760 hours. HbA1C: No results for input(s): HGBA1C in the last 72 hours. CBG: Recent Labs  Lab 06/29/19 1132  GLUCAP 116*   Lipid Profile: No results for input(s): CHOL, HDL, LDLCALC, TRIG, CHOLHDL, LDLDIRECT in the last 72 hours. Thyroid Function Tests: No results for input(s): TSH, T4TOTAL, FREET4, T3FREE, THYROIDAB in the last 72 hours. Anemia Panel: No results for input(s): VITAMINB12, FOLATE, FERRITIN, TIBC, IRON, RETICCTPCT in the last 72 hours. Sepsis Labs: No results for input(s): PROCALCITON, LATICACIDVEN in  the last 168 hours.  Recent Results (from the past 240 hour(s))  SARS Coronavirus 2 [facility] order, Performed in [facility] hospital lab) Nasopharyngeal Nasopharyngeal Swab     Status: None   Collection Time: 06/28/19  3:40 PM   Specimen: Nasopharyngeal Swab  Result Value Ref Range Status   SARS Coronavirus 2 NEGATIVE NEGATIVE Final    Comment: (NOTE) If result is NEGATIVE SARS-CoV-2 target nucleic acids are NOT DETECTED. The SARS-CoV-2 RNA is generally detectable in upper and lower  respiratory specimens during the acute phase of infection. The lowest  concentration of SARS-CoV-2 viral copies this assay can detect is 250  copies / mL. A negative result does not preclude SARS-CoV-2 infection  and should not be used as the sole basis for treatment or other  patient management decisions.  A negative result may occur with  improper specimen collection / handling, submission of specimen other  than nasopharyngeal swab, presence of viral mutation(s) within the  areas targeted by this assay, and inadequate number of viral copies  (<250 copies / mL). A negative result must be combined with clinical  observations, patient history, and epidemiological information. If result is  POSITIVE SARS-CoV-2 target nucleic acids are DETECTED. The SARS-CoV-2 RNA is generally detectable in upper and lower  respiratory specimens dur ing the acute phase of infection.  Positive  results are indicative of active infection with SARS-CoV-2.  Clinical  correlation with patient history and other diagnostic information is  necessary to determine patient infection status.  Positive results do  not rule out bacterial infection or co-infection with other viruses. If result is PRESUMPTIVE POSTIVE SARS-CoV-2 nucleic acids MAY BE PRESENT.   A presumptive positive result was obtained on the submitted specimen  and confirmed on repeat testing.  While 2019 novel coronavirus  (SARS-CoV-2) nucleic acids may be present in the submitted sample  additional confirmatory testing may be necessary for epidemiological  and / or clinical management purposes  to differentiate between  SARS-CoV-2 and other Sarbecovirus currently known to infect humans.  If clinically indicated additional testing with an alternate test  methodology [phone]) is advised. The SARS-CoV-2 RNA is generally  detectable in upper and lower respiratory sp ecimens during the acute  phase of infection. The expected result is Negative. Fact Sheet for Patients:  StrictlyIdeas.no Fact Sheet for Healthcare Providers: BankingDealers.co.za This test is not yet approved or cleared by the [place] FDA and has been authorized for detection and/or diagnosis of SARS-CoV-2 by FDA under an Emergency Use Authorization (EUA).  This EUA will remain in effect (meaning this test can be used) for the duration of the COVID-19 declaration under Section 564(b)(1) of the Act, 21 U.S.C. section 360bbb-3(b)(1), unless the authorization is terminated or revoked sooner. Performed at [facility], [address] [place], [place] [address]   Surgical pcr screen     Status: Abnormal   Collection Time:  06/28/19 10:45 PM   Specimen: Nasal Mucosa; Nasal Swab  Result Value Ref Range Status   MRSA, PCR NEGATIVE NEGATIVE Final   Staphylococcus aureus POSITIVE (A) NEGATIVE Final    Comment: (NOTE) The Xpert SA Assay (FDA approved for NASAL specimens in patients 1 years of age and older), is one component of a comprehensive surveillance program. It is not intended to diagnose infection nor to guide or monitor treatment. Performed at [facility], [place] [address] [place], [place] [address]          Radiology Studies: No results found.  Scheduled Meds: . digoxin  0.25 mg Oral Daily  . [START ON 07/06/2019] diltiazem  300 mg Oral Daily  . docusate sodium  100 mg Oral BID  . enoxaparin (LOVENOX) injection  40 mg Subcutaneous Q24H  . feeding supplement (ENSURE ENLIVE)  237 mL Oral TID BM  . lidocaine  1 patch Transdermal Q24H  . lubiprostone  24 mcg Oral BID  . metoprolol tartrate  100 mg Oral BID  . multivitamin with minerals  1 tablet Oral Daily  . mupirocin ointment  1 application Topical Daily  . pantoprazole  20 mg Oral Daily   Continuous Infusions: . lactated ringers 10 mL/hr at 06/29/19 1744  . methocarbamol (ROBAXIN) IV       LOS: 7 days    Time spent: 32 minutes    Darliss Cheney, MD [facility] Pager [phone]  If 7PM-7AM, please contact night-coverage [email or web address] Password [facility] 07/05/2019, 10:16 AM

## 2019-07-06 LAB — CBC WITH DIFFERENTIAL/PLATELET
Abs Immature Granulocytes: 0.22 10*3/uL — ABNORMAL HIGH (ref 0.00–0.07)
Basophils Absolute: 0.1 10*3/uL (ref 0.0–0.1)
Basophils Relative: 1 %
Eosinophils Absolute: 0 10*3/uL (ref 0.0–0.5)
Eosinophils Relative: 0 %
HCT: 36.9 % (ref 36.0–46.0)
Hemoglobin: 11.8 g/dL — ABNORMAL LOW (ref 12.0–15.0)
Immature Granulocytes: 2 %
Lymphocytes Relative: 6 %
Lymphs Abs: 0.6 10*3/uL — ABNORMAL LOW (ref 0.7–4.0)
MCH: 29.1 pg (ref 26.0–34.0)
MCHC: 32 g/dL (ref 30.0–36.0)
MCV: 90.9 fL (ref 80.0–100.0)
Monocytes Absolute: 0.9 10*3/uL (ref 0.1–1.0)
Monocytes Relative: 9 %
Neutro Abs: 8.6 10*3/uL — ABNORMAL HIGH (ref 1.7–7.7)
Neutrophils Relative %: 82 %
Platelets: 339 10*3/uL (ref 150–400)
RBC: 4.06 MIL/uL (ref 3.87–5.11)
RDW: 14.2 % (ref 11.5–15.5)
WBC: 10.4 10*3/uL (ref 4.0–10.5)
nRBC: 0 % (ref 0.0–0.2)

## 2019-07-06 LAB — BASIC METABOLIC PANEL
Anion gap: 12 (ref 5–15)
BUN: 17 mg/dL (ref 8–23)
CO2: 28 mmol/L (ref 22–32)
Calcium: 9.3 mg/dL (ref 8.9–10.3)
Chloride: 96 mmol/L — ABNORMAL LOW (ref 98–111)
Creatinine, Ser: 0.64 mg/dL (ref 0.44–1.00)
GFR calc Af Amer: >60 mL/min (ref >60)
GFR calc non Af Amer: >60 mL/min (ref >60)
Glucose, Bld: 126 mg/dL — ABNORMAL HIGH (ref 70–99)
Potassium: 4.5 mmol/L (ref 3.5–5.1)
Sodium: 136 mmol/L (ref 135–145)

## 2019-07-06 LAB — CREATININE, SERUM
Creatinine, Ser: 0.63 mg/dL (ref 0.44–1.00)
GFR calc Af Amer: >60 mL/min (ref >60)
GFR calc non Af Amer: >60 mL/min (ref >60)

## 2019-07-06 NOTE — Progress Notes (Signed)
PROGRESS NOTE    Betty Carpenter  PFX:[id number] DOB: 10-09-1925 DOA: 06/28/2019 PCP: Terald Sleeper, PA-C   Brief Narrative:  85 year old female with history of her with right mastectomy, hypertension was brought to the emergency department reporting that had a fall about 4 days ago.  Patient could not remember the details of the fall.  Per daughter patient fell backwards into her buttock.  At baseline patient walks with the help of the walker.  X-ray of the hip revealed left femoral neck fracture.  Orthopedic surgery is consulted.  Patient received 7 mg of morphine with oxygen saturations dropped to 80s.  Patient was placed on nonrebreather and was given Narcan.  In the emergency department patient was found to have atrial fibrillation with rapid ventricular rate of 160 -170.  Patient does not have any previous history of atrial fibrillation.  TSH 1.2.  COVID-19 test came back to be negative.  Initial set of cardiac enzymes were negative.  Orthopedic surgery is consulted, underwent hemiarthroplasty on 06/29/2019.  Evaluated by physical therapy, recommended skilled nursing facility.  Patient continued to have atrial fibrillation with rapid ventricular rate, patient initially required Cardizem drip metoprolol.  Despite increasing Cardizem, metoprolol patient continued to have high heart rate 130s to 150s.  Given 1 dose of digoxin 0.25 mg bolus with improvement of the heart rate to 90s on 07/03/2019.  Assessment & Plan:  Left femoral neck fracture s/p hemiarthroplasty on 06/29/2019 Continue with pain management as needed Physical therapy evaluation and recommended SNF DVT prophylaxis with Eliquis  New onset atrial fibrillation with RVR Heart rate controlled Troponins x3 came back to be negative Echocardiogram showed preserved EF Off of Cardizem drip Currently on metoprolol 100 mg twice daily, Cardizem CD 300 mg daily, digoxin Patient has chads vasc score of 4, started on Eliquis 2.5 mg twice daily  Monitor closely  Pressure ulcer stage II Continue with wound care  Protein calorie malnutrition moderate Continue with high-protein diet  Dementia Stable  Chronic back pain CT of the lumbar spine showed chronic L2 fracture, augmentation of T12 Continue lidocaine patch      Principal Problem:   Closed displaced fracture of left femoral neck (HCC) Active Problems:   Atrial fibrillation with rapid ventricular response (HCC)   Malnutrition of moderate degree   DVT prophylaxis: Eliquis Code Status: Full Family Communication: None present at bedside. Disposition Plan: SNF   Consultants:   Orthopedic surgery  Cardiology  Procedures: Left hip hemiarthroplasty on 06/29/2019  Subjective: Today, patient denies any new complaints.  Denies any chest pain, shortness of breath, worsening back pain.  Objective: Vitals:   07/06/19 0012 07/06/19 0312 07/06/19 0429 07/06/19 0432  BP: 120/79 (!) 137/102 136/74   Pulse: 93 (!) 31  80  Resp: (!) 25 (!) 25 (!) 21   Temp: 97.9 F (36.6 C)   97.6 F (36.4 C)  TempSrc: Oral   Oral  SpO2: 96% 90%  96%  Weight:      Height:        Intake/Output Summary (Last 24 hours) at 07/06/2019 1713 Last data filed at 07/06/2019 0433 [place] per 24 hour  Intake 120 ml  Output 300 ml  Net -180 ml   Filed Weights   06/28/19 1317 06/29/19 1743 06/30/19 0344  Weight: 56 kg 56 kg 55 kg    Examination:  General: NAD   Cardiovascular: S1, S2 present  Respiratory: CTAB  Abdomen: Soft, nontender, nondistended, bowel sounds present  Musculoskeletal: No bilateral pedal edema  noted  Skin: Normal  Psychiatry: Normal mood     Data Reviewed: I have personally reviewed following labs and imaging studies  CBC: Recent Labs  Lab 07/01/19 0253 07/02/19 0304 07/03/19 1002 07/05/19 1029 07/06/19 0817  WBC 10.4 9.6 9.9 9.7 10.4  NEUTROABS  --   --   --  8.1* 8.6*  HGB 10.2* 10.2* 12.6 12.6 11.8*  HCT 31.9* 32.0* 40.3 39.6 36.9  MCV  91.1 92.0 94.6 91.9 90.9  PLT 271 266 319 352 638   Basic Metabolic Panel: Recent Labs  Lab 06/30/19 0257 07/01/19 0253 07/03/19 1002 07/05/19 1029 07/06/19 0346 07/06/19 0817  NA 140 140 139 136  --  136  K 4.3 4.2 3.9 4.2  --  4.5  CL 106 109 104 96*  --  96*  CO2 22 23 25 28   --  28  GLUCOSE 147* 112* 135* 165*  --  126*  BUN 19 16 17 16   --  17  CREATININE 0.61 0.50 0.53 0.60 0.63 0.64  CALCIUM 9.1 8.4* 8.8* 9.1  --  9.3  MG  --   --  1.9 1.9  --   --    GFR: Estimated Creatinine Clearance: 39 mL/min (by C-G formula based on SCr of 0.64 mg/dL). Liver Function Tests: No results for input(s): AST, ALT, ALKPHOS, BILITOT, PROT, ALBUMIN in the last 168 hours. No results for input(s): LIPASE, AMYLASE in the last 168 hours. No results for input(s): AMMONIA in the last 168 hours. Coagulation Profile: No results for input(s): INR, PROTIME in the last 168 hours. Cardiac Enzymes: No results for input(s): CKTOTAL, CKMB, CKMBINDEX, TROPONINI in the last 168 hours. BNP (last 3 results) No results for input(s): PROBNP in the last 8760 hours. HbA1C: No results for input(s): HGBA1C in the last 72 hours. CBG: No results for input(s): GLUCAP in the last 168 hours. Lipid Profile: No results for input(s): CHOL, HDL, LDLCALC, TRIG, CHOLHDL, LDLDIRECT in the last 72 hours. Thyroid Function Tests: No results for input(s): TSH, T4TOTAL, FREET4, T3FREE, THYROIDAB in the last 72 hours. Anemia Panel: No results for input(s): VITAMINB12, FOLATE, FERRITIN, TIBC, IRON, RETICCTPCT in the last 72 hours. Sepsis Labs: No results for input(s): PROCALCITON, LATICACIDVEN in the last 168 hours.  Recent Results (from the past 240 hour(s))  SARS Coronavirus 2 [facility] order, Performed in [facility] hospital lab) Nasopharyngeal Nasopharyngeal Swab     Status: None   Collection Time: 06/28/19  3:40 PM   Specimen: Nasopharyngeal Swab  Result Value Ref Range Status   SARS Coronavirus 2 NEGATIVE NEGATIVE  Final    Comment: (NOTE) If result is NEGATIVE SARS-CoV-2 target nucleic acids are NOT DETECTED. The SARS-CoV-2 RNA is generally detectable in upper and lower  respiratory specimens during the acute phase of infection. The lowest  concentration of SARS-CoV-2 viral copies this assay can detect is 250  copies / mL. A negative result does not preclude SARS-CoV-2 infection  and should not be used as the sole basis for treatment or other  patient management decisions.  A negative result may occur with  improper specimen collection / handling, submission of specimen other  than nasopharyngeal swab, presence of viral mutation(s) within the  areas targeted by this assay, and inadequate number of viral copies  (<250 copies / mL). A negative result must be combined with clinical  observations, patient history, and epidemiological information. If result is POSITIVE SARS-CoV-2 target nucleic acids are DETECTED. The SARS-CoV-2 RNA is generally detectable in upper  and lower  respiratory specimens dur ing the acute phase of infection.  Positive  results are indicative of active infection with SARS-CoV-2.  Clinical  correlation with patient history and other diagnostic information is  necessary to determine patient infection status.  Positive results do  not rule out bacterial infection or co-infection with other viruses. If result is PRESUMPTIVE POSTIVE SARS-CoV-2 nucleic acids MAY BE PRESENT.   A presumptive positive result was obtained on the submitted specimen  and confirmed on repeat testing.  While 2019 novel coronavirus  (SARS-CoV-2) nucleic acids may be present in the submitted sample  additional confirmatory testing may be necessary for epidemiological  and / or clinical management purposes  to differentiate between  SARS-CoV-2 and other Sarbecovirus currently known to infect humans.  If clinically indicated additional testing with an alternate test  methodology [id number]) is advised. The  SARS-CoV-2 RNA is generally  detectable in upper and lower respiratory sp ecimens during the acute  phase of infection. The expected result is Negative. Fact Sheet for Patients:  StrictlyIdeas.no Fact Sheet for Healthcare Providers: BankingDealers.co.za This test is not yet approved or cleared by the [place] FDA and has been authorized for detection and/or diagnosis of SARS-CoV-2 by FDA under an Emergency Use Authorization (EUA).  This EUA will remain in effect (meaning this test can be used) for the duration of the COVID-19 declaration under Section 564(b)(1) of the Act, 21 U.S.C. section 360bbb-3(b)(1), unless the authorization is terminated or revoked sooner. Performed at [facility], [address], [place], [place] [address]   Surgical pcr screen     Status: Abnormal   Collection Time: 06/28/19 10:45 PM   Specimen: Nasal Mucosa; Nasal Swab  Result Value Ref Range Status   MRSA, PCR NEGATIVE NEGATIVE Final   Staphylococcus aureus POSITIVE (A) NEGATIVE Final    Comment: (NOTE) The Xpert SA Assay (FDA approved for NASAL specimens in patients 73 years of age and older), is one component of a comprehensive surveillance program. It is not intended to diagnose infection nor to guide or monitor treatment. Performed at [facility], [place] [address], [place], [place] [address]          Radiology Studies: No results found.      Scheduled Meds: . digoxin  0.25 mg Oral Daily  . diltiazem  300 mg Oral Daily  . docusate sodium  100 mg Oral BID  . enoxaparin (LOVENOX) injection  40 mg Subcutaneous Q24H  . feeding supplement (ENSURE ENLIVE)  237 mL Oral TID BM  . lidocaine  1 patch Transdermal Q24H  . lubiprostone  24 mcg Oral BID  . metoprolol tartrate  100 mg Oral BID  . multivitamin with minerals  1 tablet Oral Daily  . mupirocin ointment  1 application Topical Daily  . pantoprazole  20 mg Oral Daily   Continuous  Infusions: . lactated ringers 10 mL/hr at 06/29/19 1744  . methocarbamol (ROBAXIN) IV       LOS: 8 days    Time spent: 32 minutes    Alma Friendly, MD [facility]   If 7PM-7AM, please contact night-coverage [email or web address] 07/06/2019, 5:13 PM

## 2019-07-06 NOTE — Progress Notes (Signed)
Occupational Therapy Treatment Patient Details Name: Betty Carpenter MRN: [id number] DOB: 1925/03/23 Today's Date: 07/06/2019    History of present illness 85 y.o. female with medical history significant for breast cancer with right mastectomy, hypertension, R hip fx and surgery 03/17/19 who was brought to the ED 8/11 with reports of a fall 8/7. X-ray revealed L femoral hip fx pt also with new onset of A fib with RVR transferred to [facility] for treatment. L anterior approach hemi hip arthroplasty 06/29/19.     OT comments  Pt progressing towards acute OT goals. Limited by 8/10 (FACES) pain this session: back in supine, L hip during bed mobility. Nursing notified of pain level. +2 total A for bed mobility. Sat EOB 1-2 minutes min guard to min A before requesting and initiating return to supine. D/c plan remains appropriate.   Follow Up Recommendations  SNF    Equipment Recommendations  None recommended by OT    Recommendations for Other Services      Precautions / Restrictions Precautions Precautions: Fall Precaution Comments: hip fx result of fall  Restrictions Weight Bearing Restrictions: Yes LLE Weight Bearing: Weight bearing as tolerated       Mobility Bed Mobility Overal bed mobility: Needs Assistance Bed Mobility: Rolling;Supine to Sit;Sit to Supine Rolling: Max assist;Total assist   Supine to sit: Total assist;+2 for physical assistance Sit to supine: Total assist;+2 for physical assistance   General bed mobility comments: asssit for all aspects. Pt was able to reach across with R hand to hold onto L bed rail while rolling to left.   Transfers                 General transfer comment: deferred due to pain level at rest and pt requesting to lay back down    Balance Overall balance assessment: Needs assistance Sitting-balance support: Feet supported;Single extremity supported;Bilateral upper extremity supported Sitting balance-Leahy Scale: Poor Sitting balance -  Comments: min guard to min A with sitting balance. R lateral lean likely due to painful L hip. Postural control: Right lateral lean                                 ADL either performed or assessed with clinical judgement   ADL Overall ADL's : Needs assistance/impaired                             Toileting- Clothing Manipulation and Hygiene: Total assistance;+2 for physical assistance Toileting - Clothing Manipulation Details (indicate cue type and reason): rolled with total assist each direction for pericare and linen change due to pt having pulled purewick out and voided.  decreased awareness       General ADL Comments: Pt completed bed mobility total +2 assist then sat EOB for 1-2 minutes. R lean likely due to avoiding WB onto L hip.      Vision       Perception     Praxis      Cognition Arousal/Alertness: Awake/alert Behavior During Therapy: [facility] for tasks assessed/performed;Flat affect;Anxious Overall Cognitive Status: History of cognitive impairments - at baseline                                          Exercises     Shoulder Instructions  General Comments      Pertinent Vitals/ Pain       Pain Assessment: Faces Faces Pain Scale: Hurts whole lot Pain Location: back at rest, L hip with bed mobility Pain Descriptors / Indicators: Grimacing;Guarding;Moaning Pain Intervention(s): Monitored during session;Limited activity within patient's tolerance;Repositioned;Patient requesting pain meds-RN notified;Heat applied(heat to back)  Home Living                                          Prior Functioning/Environment              Frequency  Min 2X/week        Progress Toward Goals  OT Goals(current goals can now be found in the care plan section)  Progress towards OT goals: Progressing toward goals  Acute Rehab OT Goals Patient Stated Goal: reduce pain, lay back down OT Goal Formulation:  Patient unable to participate in goal setting Time For Goal Achievement: 07/14/19 Potential to Achieve Goals: Good ADL Goals Pt Will Perform Grooming: with supervision;sitting Pt Will Transfer to Toilet: with mod assist;stand pivot transfer;bedside commode Pt Will Perform Toileting - Clothing Manipulation and hygiene: with max assist;sit to/from stand  Plan Discharge plan remains appropriate    Co-evaluation                 AM-PAC OT "6 Clicks" Daily Activity     Outcome Measure   Help from another person eating meals?: None Help from another person taking care of personal grooming?: A Lot Help from another person toileting, which includes using toliet, bedpan, or urinal?: Total Help from another person bathing (including washing, rinsing, drying)?: A Lot Help from another person to put on and taking off regular upper body clothing?: A Lot Help from another person to put on and taking off regular lower body clothing?: Total 6 Click Score: 12    End of Session    OT Visit Diagnosis: Pain Pain - Right/Left: Left Pain - part of body: Hip(back)   Activity Tolerance Patient limited by pain   Patient Left in bed;with call bell/phone within reach;with bed alarm set;with SCD's reapplied   Nurse Communication Patient requests pain meds        Time: 1111-1130 OT Time Calculation (min): 19 min  Charges: OT General Charges $OT Visit: 1 Visit OT Treatments $Self Care/Home Management : 8-22 mins  Tyrone Schimke, OT Acute Rehabilitation Services Pager: [phone] Office: [phone]    Hortencia Pilar 07/06/2019, 1:56 PM

## 2019-07-06 NOTE — Care Management Important Message (Signed)
Important Message  Patient Details  Name: Betty Carpenter MRN: [id number] Date of Birth: 03-02-1925   Medicare Important Message Given:  Yes     Callum Wolf [address] 07/06/2019, 8:59 AM

## 2019-07-07 ENCOUNTER — Inpatient Hospital Stay (HOSPITAL_COMMUNITY): Payer: Medicare Other

## 2019-07-07 LAB — CBC WITH DIFFERENTIAL/PLATELET
Abs Immature Granulocytes: 0.25 10*3/uL — ABNORMAL HIGH (ref 0.00–0.07)
Basophils Absolute: 0 10*3/uL (ref 0.0–0.1)
Basophils Relative: 0 %
Eosinophils Absolute: 0.1 10*3/uL (ref 0.0–0.5)
Eosinophils Relative: 1 %
HCT: 32.5 % — ABNORMAL LOW (ref 36.0–46.0)
Hemoglobin: 10.4 g/dL — ABNORMAL LOW (ref 12.0–15.0)
Immature Granulocytes: 2 %
Lymphocytes Relative: 7 %
Lymphs Abs: 0.7 10*3/uL (ref 0.7–4.0)
MCH: 28.8 pg (ref 26.0–34.0)
MCHC: 32 g/dL (ref 30.0–36.0)
MCV: 90 fL (ref 80.0–100.0)
Monocytes Absolute: 1 10*3/uL (ref 0.1–1.0)
Monocytes Relative: 10 %
Neutro Abs: 8.5 10*3/uL — ABNORMAL HIGH (ref 1.7–7.7)
Neutrophils Relative %: 80 %
Platelets: 328 10*3/uL (ref 150–400)
RBC: 3.61 MIL/uL — ABNORMAL LOW (ref 3.87–5.11)
RDW: 14.3 % (ref 11.5–15.5)
WBC: 10.6 10*3/uL — ABNORMAL HIGH (ref 4.0–10.5)
nRBC: 0 % (ref 0.0–0.2)

## 2019-07-07 LAB — BASIC METABOLIC PANEL
Anion gap: 11 (ref 5–15)
BUN: 19 mg/dL (ref 8–23)
CO2: 25 mmol/L (ref 22–32)
Calcium: 8.5 mg/dL — ABNORMAL LOW (ref 8.9–10.3)
Chloride: 97 mmol/L — ABNORMAL LOW (ref 98–111)
Creatinine, Ser: 0.59 mg/dL (ref 0.44–1.00)
GFR calc Af Amer: >60 mL/min (ref >60)
GFR calc non Af Amer: >60 mL/min (ref >60)
Glucose, Bld: 129 mg/dL — ABNORMAL HIGH (ref 70–99)
Potassium: 4.4 mmol/L (ref 3.5–5.1)
Sodium: 133 mmol/L — ABNORMAL LOW (ref 135–145)

## 2019-07-07 MED ORDER — POLYETHYLENE GLYCOL 3350 17 G PO PACK
17.0000 g | PACK | Freq: Two times a day (BID) | ORAL | Status: DC
  Administered 2019-07-07 – 2019-07-10 (×7): 17 g via ORAL
  Filled 2019-07-07 (×7): qty 1

## 2019-07-07 MED ORDER — SENNOSIDES-DOCUSATE SODIUM 8.6-50 MG PO TABS
1.0000 | ORAL_TABLET | Freq: Two times a day (BID) | ORAL | Status: DC
  Administered 2019-07-07 – 2019-07-10 (×7): 1 via ORAL
  Filled 2019-07-07 (×7): qty 1

## 2019-07-07 NOTE — Progress Notes (Signed)
PROGRESS NOTE    Betty Carpenter  [id number] DOB: 05-11-1925 DOA: 06/28/2019 PCP: Terald Sleeper, PA-C   Brief Narrative:  85 year old female with history of her with right mastectomy, hypertension was brought to the emergency department reporting that had a fall about 4 days ago.  Patient could not remember the details of the fall.  Per daughter patient fell backwards into her buttock.  At baseline patient walks with the help of the walker.  X-ray of the hip revealed left femoral neck fracture.  Orthopedic surgery is consulted.  Patient received 7 mg of morphine with oxygen saturations dropped to 80s.  Patient was placed on nonrebreather and was given Narcan.  In the emergency department patient was found to have atrial fibrillation with rapid ventricular rate of 160 -170.  Patient does not have any previous history of atrial fibrillation.  TSH 1.2.  COVID-19 test came back to be negative.  Initial set of cardiac enzymes were negative.  Orthopedic surgery is consulted, underwent hemiarthroplasty on 06/29/2019.  Evaluated by physical therapy, recommended skilled nursing facility.  Patient continued to have atrial fibrillation with rapid ventricular rate, patient initially required Cardizem drip metoprolol.  Despite increasing Cardizem, metoprolol patient continued to have high heart rate 130s to 150s.  Given 1 dose of digoxin 0.25 mg bolus with improvement of the heart rate to 90s on 07/03/2019.  Assessment & Plan:  Left femoral neck fracture s/p hemiarthroplasty on 06/29/2019 Continue with pain management as needed Physical therapy evaluation and recommended SNF DVT prophylaxis with Eliquis  New onset atrial fibrillation with RVR Heart rate somewhat controlled Troponins x3 came back to be negative Echocardiogram showed preserved EF Off of Cardizem drip Currently on metoprolol 100 mg twice daily, Cardizem CD 300 mg daily, digoxin Patient has chads vasc score of 4, started on Eliquis 2.5 mg twice  daily Monitor closely  Pressure ulcer stage II Continue with wound care  Protein calorie malnutrition moderate Continue with high-protein diet  Dementia Stable  Chronic back pain CT of the lumbar spine showed chronic L1 fracture, s/p augmentation of T12 Continue lidocaine patch  Constipation Adequate bowel regimen      Principal Problem:   Closed displaced fracture of left femoral neck (HCC) Active Problems:   Atrial fibrillation with rapid ventricular response (HCC)   Malnutrition of moderate degree   DVT prophylaxis: Eliquis Code Status: Full Family Communication: Discussed with daughter over the phone on 07/07/19 Disposition Plan: SNF   Consultants:   Orthopedic surgery  Cardiology  Procedures: Left hip hemiarthroplasty on 06/29/2019  Subjective: Today, patient complained of back pain, abdominal pain.  Denies any other new complaints  Objective: Vitals:   07/06/19 2313 07/07/19 0313 07/07/19 1213 07/07/19 1223  BP: 113/65 (!) 110/54 100/62   Pulse: 78 77 (!) 58 74  Resp: 18 16 17 18   Temp: 98.1 F (36.7 C) 98.5 F (36.9 C) 98.1 F (36.7 C)   TempSrc: Oral Oral Oral   SpO2: 97% 97% 97% 97%  Weight:      Height:        Intake/Output Summary (Last 24 hours) at 07/07/2019 1641 Last data filed at 07/07/2019 0600 Gross per 24 hour  Intake -  Output 1000 ml  Net -1000 ml   Filed Weights   06/28/19 1317 06/29/19 1743 06/30/19 0344  Weight: 56 kg 56 kg 55 kg    Examination:  General: NAD   Cardiovascular: S1, S2 present  Respiratory: CTAB  Abdomen: Soft, mild generalized tenderness, nondistended,  bowel sounds present  Musculoskeletal: No bilateral pedal edema noted  Skin: Normal  Psychiatry: Normal mood     Data Reviewed: I have personally reviewed following labs and imaging studies  CBC: Recent Labs  Lab 07/02/19 0304 07/03/19 1002 07/05/19 1029 07/06/19 0817 07/07/19 0238  WBC 9.6 9.9 9.7 10.4 10.6*  NEUTROABS  --   --   8.1* 8.6* 8.5*  HGB 10.2* 12.6 12.6 11.8* 10.4*  HCT 32.0* 40.3 39.6 36.9 32.5*  MCV 92.0 94.6 91.9 90.9 90.0  PLT 266 319 352 339 [id number]   Basic Metabolic Panel: Recent Labs  Lab 07/01/19 0253 07/03/19 1002 07/05/19 1029 07/06/19 0346 07/06/19 0817 07/07/19 0238  NA 140 139 136  --  136 133*  K 4.2 3.9 4.2  --  4.5 4.4  CL 109 104 96*  --  96* 97*  CO2 23 25 28   --  28 25  GLUCOSE 112* 135* 165*  --  126* 129*  BUN 16 17 16   --  17 19  CREATININE 0.50 0.53 0.60 0.63 0.64 0.59  CALCIUM 8.4* 8.8* 9.1  --  9.3 8.5*  MG  --  1.9 1.9  --   --   --    GFR: Estimated Creatinine Clearance: 39 mL/min (by C-G formula based on SCr of 0.59 mg/dL). Liver Function Tests: No results for input(s): AST, ALT, ALKPHOS, BILITOT, PROT, ALBUMIN in the last 168 hours. No results for input(s): LIPASE, AMYLASE in the last 168 hours. No results for input(s): AMMONIA in the last 168 hours. Coagulation Profile: No results for input(s): INR, PROTIME in the last 168 hours. Cardiac Enzymes: No results for input(s): CKTOTAL, CKMB, CKMBINDEX, TROPONINI in the last 168 hours. BNP (last 3 results) No results for input(s): PROBNP in the last 8760 hours. HbA1C: No results for input(s): HGBA1C in the last 72 hours. CBG: No results for input(s): GLUCAP in the last 168 hours. Lipid Profile: No results for input(s): CHOL, HDL, LDLCALC, TRIG, CHOLHDL, LDLDIRECT in the last 72 hours. Thyroid Function Tests: No results for input(s): TSH, T4TOTAL, FREET4, T3FREE, THYROIDAB in the last 72 hours. Anemia Panel: No results for input(s): VITAMINB12, FOLATE, FERRITIN, TIBC, IRON, RETICCTPCT in the last 72 hours. Sepsis Labs: No results for input(s): PROCALCITON, LATICACIDVEN in the last 168 hours.  Recent Results (from the past 240 hour(s))  SARS Coronavirus 2 [facility] order, Performed in [facility] hospital lab) Nasopharyngeal Nasopharyngeal Swab     Status: None   Collection Time: 06/28/19  3:40 PM   Specimen:  Nasopharyngeal Swab  Result Value Ref Range Status   SARS Coronavirus 2 NEGATIVE NEGATIVE Final    Comment: (NOTE) If result is NEGATIVE SARS-CoV-2 target nucleic acids are NOT DETECTED. The SARS-CoV-2 RNA is generally detectable in upper and lower  respiratory specimens during the acute phase of infection. The lowest  concentration of SARS-CoV-2 viral copies this assay can detect is 250  copies / mL. A negative result does not preclude SARS-CoV-2 infection  and should not be used as the sole basis for treatment or other  patient management decisions.  A negative result may occur with  improper specimen collection / handling, submission of specimen other  than nasopharyngeal swab, presence of viral mutation(s) within the  areas targeted by this assay, and inadequate number of viral copies  (<250 copies / mL). A negative result must be combined with clinical  observations, patient history, and epidemiological information. If result is POSITIVE SARS-CoV-2 target nucleic acids are DETECTED. The  SARS-CoV-2 RNA is generally detectable in upper and lower  respiratory specimens dur ing the acute phase of infection.  Positive  results are indicative of active infection with SARS-CoV-2.  Clinical  correlation with patient history and other diagnostic information is  necessary to determine patient infection status.  Positive results do  not rule out bacterial infection or co-infection with other viruses. If result is PRESUMPTIVE POSTIVE SARS-CoV-2 nucleic acids MAY BE PRESENT.   A presumptive positive result was obtained on the submitted specimen  and confirmed on repeat testing.  While 2019 novel coronavirus  (SARS-CoV-2) nucleic acids may be present in the submitted sample  additional confirmatory testing may be necessary for epidemiological  and / or clinical management purposes  to differentiate between  SARS-CoV-2 and other Sarbecovirus currently known to infect humans.  If clinically  indicated additional testing with an alternate test  methodology [phone]) is advised. The SARS-CoV-2 RNA is generally  detectable in upper and lower respiratory sp ecimens during the acute  phase of infection. The expected result is Negative. Fact Sheet for Patients:  StrictlyIdeas.no Fact Sheet for Healthcare Providers: BankingDealers.co.za This test is not yet approved or cleared by the [place] FDA and has been authorized for detection and/or diagnosis of SARS-CoV-2 by FDA under an Emergency Use Authorization (EUA).  This EUA will remain in effect (meaning this test can be used) for the duration of the COVID-19 declaration under Section 564(b)(1) of the Act, 21 U.S.C. section 360bbb-3(b)(1), unless the authorization is terminated or revoked sooner. Performed at [facility], [address] [place], [place] [address]   Surgical pcr screen     Status: Abnormal   Collection Time: 06/28/19 10:45 PM   Specimen: Nasal Mucosa; Nasal Swab  Result Value Ref Range Status   MRSA, PCR NEGATIVE NEGATIVE Final   Staphylococcus aureus POSITIVE (A) NEGATIVE Final    Comment: (NOTE) The Xpert SA Assay (FDA approved for NASAL specimens in patients 17 years of age and older), is one component of a comprehensive surveillance program. It is not intended to diagnose infection nor to guide or monitor treatment. Performed at [facility], [place] [address] [place], [place] [address]          Radiology Studies: Dg Abd Portable 1v  Result Date: 07/07/2019 CLINICAL DATA:  Lower abdominal pain today, recent hip surgery EXAM: PORTABLE ABDOMEN - 1 VIEW COMPARISON:  10/22/2017 FINDINGS: Minimally prominent stool in transverse colon. Scattered gas and stool throughout remainder of colon, nondistended. Normal small bowel gas pattern. No bowel dilatation or bowel wall thickening. Bones demineralized with evidence of prior T12 vertebroplasty and  questionable L1 compression fracture. Extensive atherosclerotic calcifications aorta and iliac arteries. LEFT hip prosthesis and cannulated screws proximal RIGHT femur noted. IMPRESSION: Nonobstructive bowel gas pattern with minimally prominent stool in transverse colon. Electronically Signed   By: Lavonia Dana M.D.   On: 07/07/2019 12:09        Scheduled Meds: . digoxin  0.25 mg Oral Daily  . diltiazem  300 mg Oral Daily  . enoxaparin (LOVENOX) injection  40 mg Subcutaneous Q24H  . feeding supplement (ENSURE ENLIVE)  237 mL Oral TID BM  . lidocaine  1 patch Transdermal Q24H  . lubiprostone  24 mcg Oral BID  . metoprolol tartrate  100 mg Oral BID  . multivitamin with minerals  1 tablet Oral Daily  . mupirocin ointment  1 application Topical Daily  . pantoprazole  20 mg Oral Daily  . polyethylene glycol  17 g Oral BID  . senna-docusate  1 tablet Oral BID   Continuous Infusions: . lactated ringers 10 mL/hr at 06/29/19 1744  . methocarbamol (ROBAXIN) IV       LOS: 9 days        Alma Friendly, MD [facility]   If 7PM-7AM, please contact night-coverage [email or web address] 07/07/2019, 4:41 PM

## 2019-07-08 LAB — BASIC METABOLIC PANEL
Anion gap: 9 (ref 5–15)
BUN: 25 mg/dL — ABNORMAL HIGH (ref 8–23)
CO2: 29 mmol/L (ref 22–32)
Calcium: 8.5 mg/dL — ABNORMAL LOW (ref 8.9–10.3)
Chloride: 98 mmol/L (ref 98–111)
Creatinine, Ser: 0.64 mg/dL (ref 0.44–1.00)
GFR calc Af Amer: >60 mL/min (ref >60)
GFR calc non Af Amer: >60 mL/min (ref >60)
Glucose, Bld: 120 mg/dL — ABNORMAL HIGH (ref 70–99)
Potassium: 4.9 mmol/L (ref 3.5–5.1)
Sodium: 136 mmol/L (ref 135–145)

## 2019-07-08 LAB — CBC WITH DIFFERENTIAL/PLATELET
Abs Immature Granulocytes: 0.24 10*3/uL — ABNORMAL HIGH (ref 0.00–0.07)
Basophils Absolute: 0.1 10*3/uL (ref 0.0–0.1)
Basophils Relative: 1 %
Eosinophils Absolute: 0.1 10*3/uL (ref 0.0–0.5)
Eosinophils Relative: 1 %
HCT: 31.5 % — ABNORMAL LOW (ref 36.0–46.0)
Hemoglobin: 10.1 g/dL — ABNORMAL LOW (ref 12.0–15.0)
Immature Granulocytes: 2 %
Lymphocytes Relative: 8 %
Lymphs Abs: 0.8 10*3/uL (ref 0.7–4.0)
MCH: 29.3 pg (ref 26.0–34.0)
MCHC: 32.1 g/dL (ref 30.0–36.0)
MCV: 91.3 fL (ref 80.0–100.0)
Monocytes Absolute: 1 10*3/uL (ref 0.1–1.0)
Monocytes Relative: 10 %
Neutro Abs: 8.1 10*3/uL — ABNORMAL HIGH (ref 1.7–7.7)
Neutrophils Relative %: 78 %
Platelets: 349 10*3/uL (ref 150–400)
RBC: 3.45 MIL/uL — ABNORMAL LOW (ref 3.87–5.11)
RDW: 14.5 % (ref 11.5–15.5)
WBC: 10.3 10*3/uL (ref 4.0–10.5)
nRBC: 0 % (ref 0.0–0.2)

## 2019-07-08 NOTE — Progress Notes (Signed)
PROGRESS NOTE    Betty Carpenter  [id number] DOB: Sep 13, 1925 DOA: 06/28/2019 PCP: Terald Sleeper, PA-C   Brief Narrative:  85 year old female with history of her with right mastectomy, hypertension was brought to the emergency department reporting that had a fall about 4 days ago.  Patient could not remember the details of the fall.  Per daughter patient fell backwards into her buttock.  At baseline patient walks with the help of the walker.  X-ray of the hip revealed left femoral neck fracture.  Orthopedic surgery is consulted.  Patient received 7 mg of morphine with oxygen saturations dropped to 80s.  Patient was placed on nonrebreather and was given Narcan.  In the emergency department patient was found to have atrial fibrillation with rapid ventricular rate of 160 -170.  Patient does not have any previous history of atrial fibrillation.  TSH 1.2.  COVID-19 test came back to be negative.  Initial set of cardiac enzymes were negative.  Orthopedic surgery is consulted, underwent hemiarthroplasty on 06/29/2019.  Evaluated by physical therapy, recommended skilled nursing facility.  Patient continued to have atrial fibrillation with rapid ventricular rate, patient initially required Cardizem drip metoprolol.  Despite increasing Cardizem, metoprolol patient continued to have high heart rate 130s to 150s.  Given 1 dose of digoxin 0.25 mg bolus with improvement of the heart rate to 90s on 07/03/2019.  Assessment & Plan:  Left femoral neck fracture s/p hemiarthroplasty on 06/29/2019 Continue with pain management as needed Physical therapy evaluation and recommended SNF DVT prophylaxis with Eliquis  New onset atrial fibrillation with RVR Heart rate controlled Troponins x3 came back to be negative Echocardiogram showed preserved EF Off of Cardizem drip Currently on metoprolol 100 mg twice daily, Cardizem CD 300 mg daily, digoxin Patient has chads vasc score of 4, started on Eliquis 2.5 mg twice daily  Monitor closely  Pressure ulcer stage II Continue with wound care  Protein calorie malnutrition moderate Continue with high-protein diet  Dementia Stable  Chronic back pain CT of the lumbar spine showed chronic L1 fracture, s/p augmentation of T12 Continue lidocaine patch  Constipation Adequate bowel regimen      Principal Problem:   Closed displaced fracture of left femoral neck (HCC) Active Problems:   Atrial fibrillation with rapid ventricular response (HCC)   Malnutrition of moderate degree   DVT prophylaxis: Eliquis Code Status: Full Family Communication: Discussed with daughter over the phone on 07/07/19 Disposition Plan: SNF   Consultants:   Orthopedic surgery  Cardiology  Procedures: Left hip hemiarthroplasty on 06/29/2019  Subjective: Today, patient continues to complain of back pain, denies any other new complaints.  Objective: Vitals:   07/08/19 0053 07/08/19 0313 07/08/19 0813 07/08/19 0910  BP: 107/65 (!) 95/54 (!) 116/53   Pulse: 83 72  84  Resp: 18 18    Temp:  98.3 F (36.8 C) 97.7 F (36.5 C)   TempSrc:  Oral Oral   SpO2: 97% 98%    Weight:      Height:        Intake/Output Summary (Last 24 hours) at 07/08/2019 1431 Last data filed at 07/08/2019 0930 Gross per 24 hour  Intake 120 ml  Output -  Net 120 ml   Filed Weights   06/28/19 1317 06/29/19 1743 06/30/19 0344  Weight: 56 kg 56 kg 55 kg    Examination:  General: NAD   Cardiovascular: S1, S2 present  Respiratory: CTAB  Abdomen: Soft, nontender, nondistended, bowel sounds present  Musculoskeletal: No bilateral  pedal edema noted  Skin: Normal  Psychiatry: Normal mood     Data Reviewed: I have personally reviewed following labs and imaging studies  CBC: Recent Labs  Lab 07/03/19 1002 07/05/19 1029 07/06/19 0817 07/07/19 0238 07/08/19 0321  WBC 9.9 9.7 10.4 10.6* 10.3  NEUTROABS  --  8.1* 8.6* 8.5* 8.1*  HGB 12.6 12.6 11.8* 10.4* 10.1*  HCT 40.3 39.6  36.9 32.5* 31.5*  MCV 94.6 91.9 90.9 90.0 91.3  PLT 319 352 339 328 0000000   Basic Metabolic Panel: Recent Labs  Lab 07/03/19 1002 07/05/19 1029 07/06/19 0346 07/06/19 0817 07/07/19 0238 07/08/19 0321  NA 139 136  --  136 133* 136  K 3.9 4.2  --  4.5 4.4 4.9  CL 104 96*  --  96* 97* 98  CO2 25 28  --  28 25 29   GLUCOSE 135* 165*  --  126* 129* 120*  BUN 17 16  --  17 19 25*  CREATININE 0.53 0.60 0.63 0.64 0.59 0.64  CALCIUM 8.8* 9.1  --  9.3 8.5* 8.5*  MG 1.9 1.9  --   --   --   --    GFR: Estimated Creatinine Clearance: 39 mL/min (by C-G formula based on SCr of 0.64 mg/dL). Liver Function Tests: No results for input(s): AST, ALT, ALKPHOS, BILITOT, PROT, ALBUMIN in the last 168 hours. No results for input(s): LIPASE, AMYLASE in the last 168 hours. No results for input(s): AMMONIA in the last 168 hours. Coagulation Profile: No results for input(s): INR, PROTIME in the last 168 hours. Cardiac Enzymes: No results for input(s): CKTOTAL, CKMB, CKMBINDEX, TROPONINI in the last 168 hours. BNP (last 3 results) No results for input(s): PROBNP in the last 8760 hours. HbA1C: No results for input(s): HGBA1C in the last 72 hours. CBG: No results for input(s): GLUCAP in the last 168 hours. Lipid Profile: No results for input(s): CHOL, HDL, LDLCALC, TRIG, CHOLHDL, LDLDIRECT in the last 72 hours. Thyroid Function Tests: No results for input(s): TSH, T4TOTAL, FREET4, T3FREE, THYROIDAB in the last 72 hours. Anemia Panel: No results for input(s): VITAMINB12, FOLATE, FERRITIN, TIBC, IRON, RETICCTPCT in the last 72 hours. Sepsis Labs: No results for input(s): PROCALCITON, LATICACIDVEN in the last 168 hours.  Recent Results (from the past 240 hour(s))  SARS Coronavirus 2 [facility] order, Performed in [facility] hospital lab) Nasopharyngeal Nasopharyngeal Swab     Status: None   Collection Time: 06/28/19  3:40 PM   Specimen: Nasopharyngeal Swab  Result Value Ref Range Status   SARS  Coronavirus 2 NEGATIVE NEGATIVE Final    Comment: (NOTE) If result is NEGATIVE SARS-CoV-2 target nucleic acids are NOT DETECTED. The SARS-CoV-2 RNA is generally detectable in upper and lower  respiratory specimens during the acute phase of infection. The lowest  concentration of SARS-CoV-2 viral copies this assay can detect is 250  copies / mL. A negative result does not preclude SARS-CoV-2 infection  and should not be used as the sole basis for treatment or other  patient management decisions.  A negative result may occur with  improper specimen collection / handling, submission of specimen other  than nasopharyngeal swab, presence of viral mutation(s) within the  areas targeted by this assay, and inadequate number of viral copies  (<250 copies / mL). A negative result must be combined with clinical  observations, patient history, and epidemiological information. If result is POSITIVE SARS-CoV-2 target nucleic acids are DETECTED. The SARS-CoV-2 RNA is generally detectable in upper and lower  respiratory specimens dur ing the acute phase of infection.  Positive  results are indicative of active infection with SARS-CoV-2.  Clinical  correlation with patient history and other diagnostic information is  necessary to determine patient infection status.  Positive results do  not rule out bacterial infection or co-infection with other viruses. If result is PRESUMPTIVE POSTIVE SARS-CoV-2 nucleic acids MAY BE PRESENT.   A presumptive positive result was obtained on the submitted specimen  and confirmed on repeat testing.  While 2019 novel coronavirus  (SARS-CoV-2) nucleic acids may be present in the submitted sample  additional confirmatory testing may be necessary for epidemiological  and / or clinical management purposes  to differentiate between  SARS-CoV-2 and other Sarbecovirus currently known to infect humans.  If clinically indicated additional testing with an alternate test   methodology [phone]) is advised. The SARS-CoV-2 RNA is generally  detectable in upper and lower respiratory sp ecimens during the acute  phase of infection. The expected result is Negative. Fact Sheet for Patients:  StrictlyIdeas.no Fact Sheet for Healthcare Providers: BankingDealers.co.za This test is not yet approved or cleared by the [place] FDA and has been authorized for detection and/or diagnosis of SARS-CoV-2 by FDA under an Emergency Use Authorization (EUA).  This EUA will remain in effect (meaning this test can be used) for the duration of the COVID-19 declaration under Section 564(b)(1) of the Act, 21 U.S.C. section 360bbb-3(b)(1), unless the authorization is terminated or revoked sooner. Performed at [facility], [address] [place], [place] [address]   Surgical pcr screen     Status: Abnormal   Collection Time: 06/28/19 10:45 PM   Specimen: Nasal Mucosa; Nasal Swab  Result Value Ref Range Status   MRSA, PCR NEGATIVE NEGATIVE Final   Staphylococcus aureus POSITIVE (A) NEGATIVE Final    Comment: (NOTE) The Xpert SA Assay (FDA approved for NASAL specimens in patients 17 years of age and older), is one component of a comprehensive surveillance program. It is not intended to diagnose infection nor to guide or monitor treatment. Performed at [facility], [place] [address], [place], [place] [address]          Radiology Studies: Dg Thoracic Spine 2 View  Result Date: 07/07/2019 CLINICAL DATA:  Back pain EXAM: THORACIC SPINE 2 VIEWS COMPARISON:  None. FINDINGS: Aortic calcifications are noted. The patient is status post prior vertebral augmentation of the T12 vertebral body. There is stable height loss of the L1 vertebral body. There is age-indeterminate height loss of the T11 vertebral body as well as multiple additional thoracic vertebral bodies. IMPRESSION: 1. Age-indeterminate height loss of multiple thoracic  vertebral bodies, most evident at the T11 level. If there is clinical concern for an acute fracture, follow-up with MRI is recommended. 2. Status post vertebral augmentation of the T12 vertebral body. 3. Stable height loss of the L1 vertebral body. 4.  Aortic Atherosclerosis (ICD10-I70.0). Electronically Signed   By: Constance Holster M.D.   On: 07/07/2019 17:04   Dg Abd Portable 1v  Result Date: 07/07/2019 CLINICAL DATA:  Lower abdominal pain today, recent hip surgery EXAM: PORTABLE ABDOMEN - 1 VIEW COMPARISON:  10/22/2017 FINDINGS: Minimally prominent stool in transverse colon. Scattered gas and stool throughout remainder of colon, nondistended. Normal small bowel gas pattern. No bowel dilatation or bowel wall thickening. Bones demineralized with evidence of prior T12 vertebroplasty and questionable L1 compression fracture. Extensive atherosclerotic calcifications aorta and iliac arteries. LEFT hip prosthesis and cannulated screws proximal RIGHT femur noted.  IMPRESSION: Nonobstructive bowel gas pattern with minimally prominent stool in transverse colon. Electronically Signed   By: Lavonia Dana M.D.   On: 07/07/2019 12:09        Scheduled Meds: . digoxin  0.25 mg Oral Daily  . diltiazem  300 mg Oral Daily  . enoxaparin (LOVENOX) injection  40 mg Subcutaneous Q24H  . feeding supplement (ENSURE ENLIVE)  237 mL Oral TID BM  . lidocaine  1 patch Transdermal Q24H  . lubiprostone  24 mcg Oral BID  . metoprolol tartrate  100 mg Oral BID  . multivitamin with minerals  1 tablet Oral Daily  . mupirocin ointment  1 application Topical Daily  . pantoprazole  20 mg Oral Daily  . polyethylene glycol  17 g Oral BID  . senna-docusate  1 tablet Oral BID   Continuous Infusions: . lactated ringers 10 mL/hr at 06/29/19 1744  . methocarbamol (ROBAXIN) IV       LOS: 10 days        Alma Friendly, MD [facility]   If 7PM-7AM, please contact night-coverage [email or web address] 07/08/2019,  2:31 PM

## 2019-07-08 NOTE — Progress Notes (Signed)
Physical Therapy Treatment Patient Details Name: Betty Carpenter MRN: [id number] DOB: [birth year] Today's Date: 07/08/2019    History of Present Illness 85 y.o. female with medical history significant for breast cancer with right mastectomy, hypertension, R hip fx and surgery 03/17/19 who was brought to the ED 8/11 with reports of a fall 8/7. X-ray revealed L femoral hip fx pt also with new onset of A fib with RVR transferred to [facility] for treatment. L anterior approach hemi hip arthroplasty 06/29/19.      PT Comments    Pt limited by pain, despite having muscle relaxer prior to session.  Pt was pleasant and cooperative, but only able to get to EOB with TOT A of 2.  For OOB at this time, she would need mechanical lift if she could tolerate the positioning.  Pt with redness on spine and nursing aware and put a pad on it.  Left in semi R sidelying.  Con't to recommend SNF.  Follow Up Recommendations  SNF;Supervision/Assistance - 24 hour     Equipment Recommendations  None recommended by PT    Recommendations for Other Services       Precautions / Restrictions Restrictions LLE Weight Bearing: Weight bearing as tolerated    Mobility  Bed Mobility Overal bed mobility: Needs Assistance Bed Mobility: Rolling;Supine to Sit;Sit to Supine Rolling: Max assist   Supine to sit: Total assist;+2 for physical assistance Sit to supine: Total assist;+2 for physical assistance   General bed mobility comments: Pt attempting to help with rolling.  TOT A for supine<> sit.    Transfers                 General transfer comment: deferred due to pain level.   Ambulation/Gait                 Stairs             Wheelchair Mobility    Modified Rankin (Stroke Patients Only)       Balance   Sitting-balance support: Feet supported;Single extremity supported;Bilateral upper extremity supported Sitting balance-Leahy Scale: Poor Sitting balance - Comments: min/guard with slight  R lean.                                      Cognition Arousal/Alertness: Awake/alert Behavior During Therapy: WFL for tasks assessed/performed;Flat affect;Anxious Overall Cognitive Status: History of cognitive impairments - at baseline                                        Exercises      General Comments General comments (skin integrity, edema, etc.): Pt with redness along spine.  Nurse in to look and placed pad on back. Pt left in semi R sidelying      Pertinent Vitals/Pain Pain Assessment: Faces Faces Pain Scale: Hurts whole lot Pain Location: back at rest, L hip with bed mobility Pain Descriptors / Indicators: Grimacing;Guarding;Moaning Pain Intervention(s): Limited activity within patient's tolerance;Monitored during session;Repositioned;Premedicated before session    Home Living                      Prior Function            PT Goals (current goals can now be found in the care plan section) Acute Rehab PT  Goals PT Goal Formulation: Patient unable to participate in goal setting Time For Goal Achievement: 07/14/19 Potential to Achieve Goals: Fair Progress towards PT goals: Progressing toward goals    Frequency    Min 2X/week      PT Plan Current plan remains appropriate    Co-evaluation              AM-PAC PT "6 Clicks" Mobility   Outcome Measure  Help needed turning from your back to your side while in a flat bed without using bedrails?: A Lot Help needed moving from lying on your back to sitting on the side of a flat bed without using bedrails?: Total Help needed moving to and from a bed to a chair (including a wheelchair)?: Total Help needed standing up from a chair using your arms (e.g., wheelchair or bedside chair)?: Total Help needed to walk in hospital room?: Total Help needed climbing 3-5 steps with a railing? : Total 6 Click Score: 7    End of Session   Activity Tolerance: Patient limited by  pain Patient left: in bed;with call bell/phone within reach;with bed alarm set Nurse Communication: Mobility status;Patient requests pain meds PT Visit Diagnosis: Unsteadiness on feet (R26.81);Repeated falls (R29.6);History of falling (Z91.81);Difficulty in walking, not elsewhere classified (R26.2);Pain;Muscle weakness (generalized) (M62.81);Other abnormalities of gait and mobility (R26.89) Pain - Right/Left: Left Pain - part of body: Hip     Time: [id number] PT Time Calculation (min) (ACUTE ONLY): 17 min  Charges:  $Therapeutic Activity: 8-22 mins                     Betty Carpenter, [place] Pager [phone] 07/08/2019    Galen Manila 07/08/2019, 12:00 PM

## 2019-07-08 NOTE — TOC Progression Note (Addendum)
Transition of Care [facility]) - Progression Note    Patient Details  Name: Betty Carpenter MRN: [id number] Date of Birth: 11/06/1925  Transition of Care [facility]) CM/SW [place], [place] Phone Number: 07/08/2019, 2:34 PM  Clinical Narrative:     Update: Patient's daughter chose [facility]. She should be able to discharge on Monday. CSW will continue to follow.   The patient still has pending bed offers. CSW called [facility] with [place] with [facility] left a voicemail for both facilities to follow up on the SNF referral. CSW is awaiting a return phone call.   CSW will need to provide bed offers once the become available.   Expected Discharge Plan: [place] Barriers to Discharge: Continued Medical Work up  Expected Discharge Plan and Services Expected Discharge Plan: [place] In-house Referral: Clinical Social Work Discharge Planning Services: NA Post Acute Care Choice: [place] Living arrangements for the past 2 months: Single Family Home                 DME Arranged: N/A DME Agency: NA       HH Arranged: NA HH Agency: NA         Social Determinants of Health (SDOH) Interventions    Readmission Risk Interventions Readmission Risk Prevention Plan 03/22/2019  Transportation Screening Complete  PCP or Specialist Appt within 5-7 Days Complete  Home Care Screening Complete  Medication Review (RN CM) Complete  Some recent data might be hidden

## 2019-07-09 LAB — CBC WITH DIFFERENTIAL/PLATELET
Abs Immature Granulocytes: 0.31 10*3/uL — ABNORMAL HIGH (ref 0.00–0.07)
Basophils Absolute: 0.1 10*3/uL (ref 0.0–0.1)
Basophils Relative: 1 %
Eosinophils Absolute: 0.1 10*3/uL (ref 0.0–0.5)
Eosinophils Relative: 1 %
HCT: 34.5 % — ABNORMAL LOW (ref 36.0–46.0)
Hemoglobin: 10.8 g/dL — ABNORMAL LOW (ref 12.0–15.0)
Immature Granulocytes: 3 %
Lymphocytes Relative: 6 %
Lymphs Abs: 0.6 10*3/uL — ABNORMAL LOW (ref 0.7–4.0)
MCH: 29.2 pg (ref 26.0–34.0)
MCHC: 31.3 g/dL (ref 30.0–36.0)
MCV: 93.2 fL (ref 80.0–100.0)
Monocytes Absolute: 0.9 10*3/uL (ref 0.1–1.0)
Monocytes Relative: 9 %
Neutro Abs: 7.8 10*3/uL — ABNORMAL HIGH (ref 1.7–7.7)
Neutrophils Relative %: 80 %
Platelets: 361 10*3/uL (ref 150–400)
RBC: 3.7 MIL/uL — ABNORMAL LOW (ref 3.87–5.11)
RDW: 14.5 % (ref 11.5–15.5)
WBC: 9.8 10*3/uL (ref 4.0–10.5)
nRBC: 0 % (ref 0.0–0.2)

## 2019-07-09 MED ORDER — DILTIAZEM HCL ER COATED BEADS 240 MG PO CP24
240.0000 mg | ORAL_CAPSULE | Freq: Every day | ORAL | Status: DC
  Administered 2019-07-10 – 2019-07-11 (×2): 240 mg via ORAL
  Filled 2019-07-09 (×2): qty 1

## 2019-07-09 NOTE — Plan of Care (Signed)
  Problem: Education: Goal: Knowledge of General Education information will improve Description: Including pain rating scale, medication(s)/side effects and non-pharmacologic comfort measures Outcome: Not Progressing  Patient confused.   Problem: Health Behavior/Discharge Planning: Goal: Ability to manage health-related needs will improve Outcome: Not Progressing  Patient confused.  Problem: Nutrition: Goal: Adequate nutrition will be maintained Outcome: Not Progressing  Poor appetite, refusing meals. PO encouraged.   Problem: Elimination: Goal: Will not experience complications related to bowel motility Outcome: Not Progressing  Constipated. Medication provided.   Problem: Pain Managment: Goal: General experience of comfort will improve Outcome: Not Progressing  C/O constant pain. Medication provided as ordered.

## 2019-07-09 NOTE — Progress Notes (Signed)
PROGRESS NOTE    Betty Carpenter  [id number] DOB: 29-Nov-1924 DOA: 06/28/2019 PCP: Terald Sleeper, PA-C   Brief Narrative:  85 year old female with history of her with right mastectomy, hypertension was brought to the emergency department reporting that had a fall about 4 days ago.  Patient could not remember the details of the fall.  Per daughter patient fell backwards into her buttock.  At baseline patient walks with the help of the walker.  X-ray of the hip revealed left femoral neck fracture.  Orthopedic surgery is consulted.  Patient received 7 mg of morphine with oxygen saturations dropped to 80s.  Patient was placed on nonrebreather and was given Narcan.  In the emergency department patient was found to have atrial fibrillation with rapid ventricular rate of 160 -170.  Patient does not have any previous history of atrial fibrillation.  TSH 1.2.  COVID-19 test came back to be negative.  Initial set of cardiac enzymes were negative.  Orthopedic surgery is consulted, underwent hemiarthroplasty on 06/29/2019.  Evaluated by physical therapy, recommended skilled nursing facility.  Patient continued to have atrial fibrillation with rapid ventricular rate, patient initially required Cardizem drip metoprolol.  Despite increasing Cardizem, metoprolol patient continued to have high heart rate 130s to 150s.  Given 1 dose of digoxin 0.25 mg bolus with improvement of the heart rate to 90s on 07/03/2019.  Assessment & Plan:  Left femoral neck fracture s/p hemiarthroplasty on 06/29/2019 Continue with pain management as needed Physical therapy evaluation and recommended SNF DVT prophylaxis with Eliquis  New onset atrial fibrillation with RVR Heart rate controlled Troponins x3 came back to be negative Echocardiogram showed preserved EF Off of Cardizem drip Currently on metoprolol 100 mg twice daily, Cardizem CD 300 mg daily, digoxin Patient has chads vasc score of 4, started on Eliquis 2.5 mg twice daily  Monitor closely  Pressure ulcer stage II Continue with wound care  Protein calorie malnutrition moderate Continue with high-protein diet  Dementia Stable  Chronic back pain CT of the lumbar spine showed chronic L1 fracture, s/p augmentation of T12 Continue lidocaine patch  Constipation Adequate bowel regimen      Principal Problem:   Closed displaced fracture of left femoral neck (HCC) Active Problems:   Atrial fibrillation with rapid ventricular response (HCC)   Malnutrition of moderate degree   DVT prophylaxis: Eliquis Code Status: Full Family Communication: Discussed with daughter over the phone on 07/07/19 Disposition Plan: SNF on 07/11/19   Consultants:   Orthopedic surgery  Cardiology  Procedures: Left hip hemiarthroplasty on 06/29/2019  Subjective: Today, patient denies any new complaints  Objective: Vitals:   07/09/19 0408 07/09/19 0600 07/09/19 0808 07/09/19 1200  BP: 102/66  127/77 118/66  Pulse: 79 80 95 85  Resp: 20 (!) 23 (!) 21 (!) 25  Temp: 97.7 F (36.5 C) 97.8 F (36.6 C) (!) 97.4 F (36.3 C) (!) 97.5 F (36.4 C)  TempSrc: Axillary Oral Oral Oral  SpO2: 97% 99% 98% 98%  Weight:      Height:        Intake/Output Summary (Last 24 hours) at 07/09/2019 1503 Last data filed at 07/09/2019 1300 Gross per 24 hour  Intake 240 ml  Output 1450 ml  Net -1210 ml   Filed Weights   06/28/19 1317 06/29/19 1743 06/30/19 0344  Weight: 56 kg 56 kg 55 kg    Examination:  General: NAD   Cardiovascular: S1, S2 present  Respiratory: CTAB  Abdomen: Soft, nontender, nondistended, bowel sounds  present  Musculoskeletal: No bilateral pedal edema noted  Skin: Normal  Psychiatry: Normal mood     Data Reviewed: I have personally reviewed following labs and imaging studies  CBC: Recent Labs  Lab 07/05/19 1029 07/06/19 0817 07/07/19 0238 07/08/19 0321 07/09/19 0236  WBC 9.7 10.4 10.6* 10.3 9.8  NEUTROABS 8.1* 8.6* 8.5* 8.1* 7.8*   HGB 12.6 11.8* 10.4* 10.1* 10.8*  HCT 39.6 36.9 32.5* 31.5* 34.5*  MCV 91.9 90.9 90.0 91.3 93.2  PLT 352 339 328 349 [id number]   Basic Metabolic Panel: Recent Labs  Lab 07/03/19 1002 07/05/19 1029 07/06/19 0346 07/06/19 0817 07/07/19 0238 07/08/19 0321  NA 139 136  --  136 133* 136  K 3.9 4.2  --  4.5 4.4 4.9  CL 104 96*  --  96* 97* 98  CO2 25 28  --  28 25 29   GLUCOSE 135* 165*  --  126* 129* 120*  BUN 17 16  --  17 19 25*  CREATININE 0.53 0.60 0.63 0.64 0.59 0.64  CALCIUM 8.8* 9.1  --  9.3 8.5* 8.5*  MG 1.9 1.9  --   --   --   --    GFR: Estimated Creatinine Clearance: 39 mL/min (by C-G formula based on SCr of 0.64 mg/dL). Liver Function Tests: No results for input(s): AST, ALT, ALKPHOS, BILITOT, PROT, ALBUMIN in the last 168 hours. No results for input(s): LIPASE, AMYLASE in the last 168 hours. No results for input(s): AMMONIA in the last 168 hours. Coagulation Profile: No results for input(s): INR, PROTIME in the last 168 hours. Cardiac Enzymes: No results for input(s): CKTOTAL, CKMB, CKMBINDEX, TROPONINI in the last 168 hours. BNP (last 3 results) No results for input(s): PROBNP in the last 8760 hours. HbA1C: No results for input(s): HGBA1C in the last 72 hours. CBG: No results for input(s): GLUCAP in the last 168 hours. Lipid Profile: No results for input(s): CHOL, HDL, LDLCALC, TRIG, CHOLHDL, LDLDIRECT in the last 72 hours. Thyroid Function Tests: No results for input(s): TSH, T4TOTAL, FREET4, T3FREE, THYROIDAB in the last 72 hours. Anemia Panel: No results for input(s): VITAMINB12, FOLATE, FERRITIN, TIBC, IRON, RETICCTPCT in the last 72 hours. Sepsis Labs: No results for input(s): PROCALCITON, LATICACIDVEN in the last 168 hours.  No results found for this or any previous visit (from the past 240 hour(s)).       Radiology Studies: Dg Thoracic Spine 2 View  Result Date: 07/07/2019 CLINICAL DATA:  Back pain EXAM: THORACIC SPINE 2 VIEWS COMPARISON:  None.  FINDINGS: Aortic calcifications are noted. The patient is status post prior vertebral augmentation of the T12 vertebral body. There is stable height loss of the L1 vertebral body. There is age-indeterminate height loss of the T11 vertebral body as well as multiple additional thoracic vertebral bodies. IMPRESSION: 1. Age-indeterminate height loss of multiple thoracic vertebral bodies, most evident at the T11 level. If there is clinical concern for an acute fracture, follow-up with MRI is recommended. 2. Status post vertebral augmentation of the T12 vertebral body. 3. Stable height loss of the L1 vertebral body. 4.  Aortic Atherosclerosis (ICD10-I70.0). Electronically Signed   By: Constance Holster M.D.   On: 07/07/2019 17:04        Scheduled Meds: . digoxin  0.25 mg Oral Daily  . diltiazem  300 mg Oral Daily  . enoxaparin (LOVENOX) injection  40 mg Subcutaneous Q24H  . feeding supplement (ENSURE ENLIVE)  237 mL Oral TID BM  . lidocaine  1 patch  Transdermal Q24H  . lubiprostone  24 mcg Oral BID  . metoprolol tartrate  100 mg Oral BID  . multivitamin with minerals  1 tablet Oral Daily  . mupirocin ointment  1 application Topical Daily  . pantoprazole  20 mg Oral Daily  . polyethylene glycol  17 g Oral BID  . senna-docusate  1 tablet Oral BID   Continuous Infusions: . lactated ringers 10 mL/hr at 06/29/19 1744  . methocarbamol (ROBAXIN) IV       LOS: 11 days        Alma Friendly, MD [facility]   If 7PM-7AM, please contact night-coverage [email or web address] 07/09/2019, 3:03 PM

## 2019-07-10 MED ORDER — METOPROLOL TARTRATE 50 MG PO TABS
50.0000 mg | ORAL_TABLET | Freq: Two times a day (BID) | ORAL | Status: DC
  Administered 2019-07-10 – 2019-07-11 (×2): 50 mg via ORAL
  Filled 2019-07-10 (×2): qty 1

## 2019-07-10 NOTE — Progress Notes (Signed)
PROGRESS NOTE    Betty Carpenter  [id number] DOB: 08-11-1925 DOA: 06/28/2019 PCP: Terald Sleeper, PA-C   Brief Narrative:  85 year old female with history of her with right mastectomy, hypertension was brought to the emergency department reporting that had a fall about 4 days ago.  Patient could not remember the details of the fall.  Per daughter patient fell backwards into her buttock.  At baseline patient walks with the help of the walker.  X-ray of the hip revealed left femoral neck fracture.  Orthopedic surgery is consulted.  Patient received 7 mg of morphine with oxygen saturations dropped to 80s.  Patient was placed on nonrebreather and was given Narcan.  In the emergency department patient was found to have atrial fibrillation with rapid ventricular rate of 160 -170.  Patient does not have any previous history of atrial fibrillation.  TSH 1.2.  COVID-19 test came back to be negative.  Initial set of cardiac enzymes were negative.  Orthopedic surgery is consulted, underwent hemiarthroplasty on 06/29/2019.  Evaluated by physical therapy, recommended skilled nursing facility.  Patient continued to have atrial fibrillation with rapid ventricular rate, patient initially required Cardizem drip metoprolol.  Despite increasing Cardizem, metoprolol patient continued to have high heart rate 130s to 150s.  Given 1 dose of digoxin 0.25 mg bolus with improvement of the heart rate to 90s on 07/03/2019.  Assessment & Plan:  Left femoral neck fracture s/p hemiarthroplasty on 06/29/2019 Continue with pain management as needed Physical therapy evaluation and recommended SNF DVT prophylaxis with Eliquis  New onset atrial fibrillation with RVR Heart rate controlled Troponins x3 came back to be negative Echocardiogram showed preserved EF Off of Cardizem drip Currently on lopressor, cardizem CD, digoxin, adjust accordingly Patient has chads vasc score of 4, continue Eliquis 2.5 mg twice daily Monitor closely   Pressure ulcer stage II Continue with wound care  Protein calorie malnutrition moderate Continue with high-protein diet  Dementia Stable  Chronic back pain CT of the lumbar spine showed chronic L1 fracture, s/p augmentation of T12 Continue lidocaine patch  Constipation Adequate bowel regimen      Principal Problem:   Closed displaced fracture of left femoral neck (HCC) Active Problems:   Atrial fibrillation with rapid ventricular response (HCC)   Malnutrition of moderate degree   DVT prophylaxis: Eliquis Code Status: Full Family Communication: Discussed with daughter over the phone on 07/07/19 Disposition Plan: SNF on 07/11/19   Consultants:   Orthopedic surgery  Cardiology  Procedures: Left hip hemiarthroplasty on 06/29/2019  Subjective: Today, patient complaining of chronic back pain  Objective: Vitals:   07/10/19 0910 07/10/19 1039 07/10/19 1039 07/10/19 1206  BP: 94/73 111/80 111/80 (!) 104/56  Pulse:   (!) 102 74  Resp:    16  Temp: 97.6 F (36.4 C) 97.7 F (36.5 C)  (!) 97.5 F (36.4 C)  TempSrc: Axillary   Oral  SpO2: 98% 100%  99%  Weight:      Height:        Intake/Output Summary (Last 24 hours) at 07/10/2019 1429 Last data filed at 07/10/2019 0736 Gross per 24 hour  Intake 600 ml  Output 700 ml  Net -100 ml   Filed Weights   06/28/19 1317 06/29/19 1743 06/30/19 0344  Weight: 56 kg 56 kg 55 kg    Examination:  General: NAD   Cardiovascular: S1, S2 present  Respiratory: CTAB  Abdomen: Soft, nontender, nondistended, bowel sounds present  Musculoskeletal: No bilateral pedal edema noted  Skin:  Normal  Psychiatry: Normal mood     Data Reviewed: I have personally reviewed following labs and imaging studies  CBC: Recent Labs  Lab 07/05/19 1029 07/06/19 0817 07/07/19 0238 07/08/19 0321 07/09/19 0236  WBC 9.7 10.4 10.6* 10.3 9.8  NEUTROABS 8.1* 8.6* 8.5* 8.1* 7.8*  HGB 12.6 11.8* 10.4* 10.1* 10.8*  HCT 39.6 36.9 32.5*  31.5* 34.5*  MCV 91.9 90.9 90.0 91.3 93.2  PLT 352 339 328 349 [id number]   Basic Metabolic Panel: Recent Labs  Lab 07/05/19 1029 07/06/19 0346 07/06/19 0817 07/07/19 0238 07/08/19 0321  NA 136  --  136 133* 136  K 4.2  --  4.5 4.4 4.9  CL 96*  --  96* 97* 98  CO2 28  --  28 25 29   GLUCOSE 165*  --  126* 129* 120*  BUN 16  --  17 19 25*  CREATININE 0.60 0.63 0.64 0.59 0.64  CALCIUM 9.1  --  9.3 8.5* 8.5*  MG 1.9  --   --   --   --    GFR: Estimated Creatinine Clearance: 39 mL/min (by C-G formula based on SCr of 0.64 mg/dL). Liver Function Tests: No results for input(s): AST, ALT, ALKPHOS, BILITOT, PROT, ALBUMIN in the last 168 hours. No results for input(s): LIPASE, AMYLASE in the last 168 hours. No results for input(s): AMMONIA in the last 168 hours. Coagulation Profile: No results for input(s): INR, PROTIME in the last 168 hours. Cardiac Enzymes: No results for input(s): CKTOTAL, CKMB, CKMBINDEX, TROPONINI in the last 168 hours. BNP (last 3 results) No results for input(s): PROBNP in the last 8760 hours. HbA1C: No results for input(s): HGBA1C in the last 72 hours. CBG: No results for input(s): GLUCAP in the last 168 hours. Lipid Profile: No results for input(s): CHOL, HDL, LDLCALC, TRIG, CHOLHDL, LDLDIRECT in the last 72 hours. Thyroid Function Tests: No results for input(s): TSH, T4TOTAL, FREET4, T3FREE, THYROIDAB in the last 72 hours. Anemia Panel: No results for input(s): VITAMINB12, FOLATE, FERRITIN, TIBC, IRON, RETICCTPCT in the last 72 hours. Sepsis Labs: No results for input(s): PROCALCITON, LATICACIDVEN in the last 168 hours.  No results found for this or any previous visit (from the past 240 hour(s)).       Radiology Studies: No results found.      Scheduled Meds: . digoxin  0.25 mg Oral Daily  . diltiazem  240 mg Oral Daily  . enoxaparin (LOVENOX) injection  40 mg Subcutaneous Q24H  . feeding supplement (ENSURE ENLIVE)  237 mL Oral TID BM  .  lidocaine  1 patch Transdermal Q24H  . lubiprostone  24 mcg Oral BID  . metoprolol tartrate  50 mg Oral BID  . multivitamin with minerals  1 tablet Oral Daily  . mupirocin ointment  1 application Topical Daily  . pantoprazole  20 mg Oral Daily  . polyethylene glycol  17 g Oral BID  . senna-docusate  1 tablet Oral BID   Continuous Infusions: . lactated ringers 10 mL/hr at 06/29/19 1744  . methocarbamol (ROBAXIN) IV       LOS: 12 days        Alma Friendly, MD [facility]   If 7PM-7AM, please contact night-coverage [email or web address] 07/10/2019, 2:29 PM

## 2019-07-10 NOTE — Plan of Care (Signed)
Problem: Elimination: Goal: Will not experience complications related to bowel motility Outcome: Not Progressing  Incontinent of Bowel and Bladder. Multiple bowel movements this shift. Peri care provided.   Problem: Pain Managment: Goal: General experience of comfort will improve Outcome: Not Progressing  C/O pain throughout shift. Medication provided as ordered.

## 2019-07-11 ENCOUNTER — Inpatient Hospital Stay
Admission: RE | Admit: 2019-07-11 | Discharge: 2019-07-29 | Disposition: A | Discharge status: Home / Self Care | Payer: Medicare Other | Source: Ambulatory Visit | Attending: Internal Medicine | Admitting: Internal Medicine

## 2019-07-11 MED ORDER — DILTIAZEM HCL ER COATED BEADS 240 MG PO CP24: 240.0000 mg | ORAL_CAPSULE | Freq: Every day | ORAL | Status: DC

## 2019-07-11 MED ORDER — ENOXAPARIN SODIUM 40 MG/0.4ML ~~LOC~~ SOLN: 40.0000 mg | Freq: Every day | SUBCUTANEOUS | 14 refills | Status: DC

## 2019-07-11 MED ORDER — DIGOXIN 250 MCG PO TABS: 0.2500 mg | ORAL_TABLET | Freq: Every day | ORAL | Status: DC

## 2019-07-11 MED ORDER — METOPROLOL TARTRATE 50 MG PO TABS: 50.0000 mg | ORAL_TABLET | Freq: Two times a day (BID) | ORAL | Status: DC

## 2019-07-11 MED ORDER — HYDROCODONE-ACETAMINOPHEN 10-325 MG PO TABS: 1.0000 | ORAL_TABLET | Freq: Four times a day (QID) | ORAL | 0 refills | Status: DC | PRN

## 2019-07-11 MED ORDER — LIDOCAINE 5 % EX PTCH: 1.0000 | MEDICATED_PATCH | CUTANEOUS | 0 refills | Status: DC

## 2019-07-11 NOTE — Discharge Summary (Signed)
Discharge Summary  Betty Carpenter [id number] DOB: 17-Jul-1925  PCP: Terald Sleeper, PA-C  Admit date: 06/28/2019 Discharge date: 07/11/2019  Time spent: 40 mins  Recommendations for Outpatient Follow-up:  1. PCP in 1 week 2. Orthopedics 3. Cardiology  Discharge Diagnoses:  Active Hospital Problems   Diagnosis Date Noted   Closed displaced fracture of left femoral neck ([place]) 06/28/2019   Malnutrition of moderate degree 07/01/2019   Atrial fibrillation with rapid ventricular response ([place]) 06/28/2019    Resolved Hospital Problems  No resolved problems to display.    Discharge Condition: Stable  Diet recommendation: As tolerated  Vitals:   07/11/19 0649 07/11/19 0844  BP: 113/66 (!) 111/58  Pulse: 91 95  Resp: 16   Temp: 98.7 F (37.1 C)   SpO2: 98% 100%    History of present illness:  85 year old female with history of her with right mastectomy, hypertension was brought to the emergency department reporting that had a fall about 4 days ago.  Patient could not remember the details of the fall.  Per daughter patient fell backwards into her buttock.  At baseline patient walks with the help of the walker.  X-ray of the hip revealed left femoral neck fracture.  Orthopedic surgery is consulted.  Patient received 7 mg of morphine with oxygen saturations dropped to 80s.  Patient was placed on nonrebreather and was given Narcan.  In the emergency department patient was found to have atrial fibrillation with rapid ventricular rate of 160 -170.  Patient does not have any previous history of atrial fibrillation.  TSH 1.2.  COVID-19 test came back to be negative.  Initial set of cardiac enzymes were negative.  Orthopedic surgery is consulted, underwent hemiarthroplasty on 06/29/2019.  Evaluated by physical therapy, recommended skilled nursing facility.    Today, patient denies any new complaints.  Complains about her chronic back pain.  Patient denies any chest pain, worsening shortness  of breath, abdominal pain, nausea/vomiting, fever/chills.   Hospital Course:  Principal Problem:   Closed displaced fracture of left femoral neck (HCC) Active Problems:   Atrial fibrillation with rapid ventricular response (HCC)   Malnutrition of moderate degree  Left femoral neck fracture s/p hemiarthroplasty on 06/29/2019 Continue with pain management as needed Physical therapy evaluation and recommended SNF DVT prophylaxis with Eliquis  New onset atrial fibrillation with RVR Heart rate controlled Troponins x3 came back to be negative Echocardiogram showed preserved EF Currently on lopressor, cardizem CD, digoxin, adjust accordingly Patient has chads vasc score of 4, continue Eliquis 2.5 mg twice daily  Pressure ulcer stage II Continue with wound care  Protein calorie malnutrition moderate Continue with high-protein diet  Dementia Stable  Chronic back pain Prior to admission, at previous SNF patient takes OxyContin 20 mg twice daily, but due to recent fall, significant desaturation on admission, soft BP, this was not continued in the hospital If pain continues to persist, may restart her on OxyContin with careful monitoring of her sats and strict fall prevention CT of the lumbar spine showed chronic L1 fracture, s/p augmentation of T12 Continue Norco 10, lidocaine patch, Celebrex  Constipation Chronic opioid related Continue adequate bowel regimen          Malnutrition Type:  Nutrition Problem: Moderate Malnutrition Etiology: chronic illness(dementia)   Malnutrition Characteristics:  Signs/Symptoms: mild fat depletion, moderate fat depletion, mild muscle depletion, moderate muscle depletion   Nutrition Interventions:  Interventions: Ensure Enlive (each supplement provides 350kcal and 20 grams of protein), MVI, Magic cup  Estimated body mass index is 16.92 kg/m as calculated from the following:   Height as of this encounter: 5' 10.98" (1.803  m).   Weight as of this encounter: 55 kg.    Procedures:  Left hip hemiarthroplasty on 06/29/2019  Consultations:  Orthopedic surgery  Cardiology  Discharge Exam: BP (!) 111/58    Pulse 95    Temp 98.7 F (37.1 C)    Resp 16    Ht 5' 10.98" (1.803 m)    Wt 55 kg    SpO2 100%    BMI 16.92 kg/m   General: NAD Cardiovascular: S1, S2 present Respiratory: CTA B  Discharge Instructions You were cared for by a hospitalist during your hospital stay. If you have any questions about your discharge medications or the care you received while you were in the hospital after you are discharged, you can call the unit and asked to speak with the hospitalist on call if the hospitalist that took care of you is not available. Once you are discharged, your primary care physician will handle any further medical issues. Please note that NO REFILLS for any discharge medications will be authorized once you are discharged, as it is imperative that you return to your primary care physician (or establish a relationship with a primary care physician if you do not have one) for your aftercare needs so that they can reassess your need for medications and monitor your lab values.  Discharge Instructions    Weight bearing as tolerated   Complete by: As directed      Allergies as of 07/11/2019      Reactions   Cymbalta [duloxetine Hcl]    " couldn't make water"- unable to urinate      Medication List    STOP taking these medications   amLODipine 5 MG tablet Commonly known as: NORVASC   OxyCONTIN 20 mg 12 hr tablet Generic drug: oxyCODONE     TAKE these medications   acetaminophen 325 MG tablet Commonly known as: TYLENOL Take 650 mg by mouth every 4 (four) hours.   celecoxib 200 MG capsule Commonly known as: CELEBREX Take 1 capsule (200 mg total) by mouth 2 (two) times daily.   digoxin 0.25 MG tablet Commonly known as: LANOXIN Take 1 tablet (0.25 mg total) by mouth daily. Start taking on:  July 12, 2019   diltiazem 240 MG 24 hr capsule Commonly known as: CARDIZEM CD Take 1 capsule (240 mg total) by mouth daily. Start taking on: July 12, 2019   Docusate Sodium 100 MG capsule Take 100 mg by mouth 2 (two) times daily.   enoxaparin 40 MG/0.4ML injection Commonly known as: LOVENOX Inject 0.4 mLs (40 mg total) into the skin daily.   feeding supplement (ENSURE ENLIVE) Liqd Take 237 mLs by mouth 2 (two) times daily between meals.   HYDROcodone-acetaminophen 10-325 MG tablet Commonly known as: Norco Take 1 tablet by mouth every 6 (six) hours as needed.   Imodium A-D 2 MG tablet Generic drug: loperamide Take 2 mg by mouth 4 (four) times daily as needed for diarrhea or loose stools.   lidocaine 5 % Commonly known as: LIDODERM Place 1 patch onto the skin daily. Remove & Discard patch within 12 hours or as directed by MD Start taking on: July 12, 2019   lubiprostone 24 MCG capsule Commonly known as: AMITIZA Take 24 mcg by mouth 2 (two) times daily.   magnesium hydroxide 400 MG/5ML suspension Commonly known as: MILK OF MAGNESIA Take 30  mLs by mouth 2 (two) times daily as needed for mild constipation.   metoprolol tartrate 50 MG tablet Commonly known as: LOPRESSOR Take 1 tablet (50 mg total) by mouth 2 (two) times daily. What changed:   medication strength  how much to take   multivitamin with minerals tablet Take 1 tablet by mouth daily.   NON FORMULARY Diet Type:   NAS   ondansetron 4 MG disintegrating tablet Commonly known as: ZOFRAN-ODT Take 4 mg by mouth every 6 (six) hours as needed for nausea or vomiting.   pantoprazole 20 MG tablet Commonly known as: Protonix Take 1 tablet (20 mg total) by mouth daily.   Salonpas Pain Relief Patch Ptch Apply 1 patch topically daily as needed.   tiZANidine 4 MG tablet Commonly known as: ZANAFLEX Take 4 mg by mouth every 6 (six) hours as needed for muscle spasms.   UNABLE TO FIND Wound Care -   Clean  wound right malleolar with N/S, apply santyl ointment and cover with allevyn foam dressing and change daily and as needed   Vitamin D (Ergocalciferol) 1.25 MG (50000 UT) Caps capsule Commonly known as: DRISDOL Take 50,000 Units by mouth See admin instructions. Twice a week on Tues, Saturday            Discharge Care Instructions  (From admission, onward)         Start     Ordered   06/29/19 0000  Weight bearing as tolerated     06/29/19 1719         Allergies  Allergen Reactions   Cymbalta [Duloxetine Hcl]     " couldn't make water"- unable to urinate    Contact information for follow-up providers    Leandrew Koyanagi, MD In 2 weeks.   Specialty: Orthopedic Surgery Why: For suture removal, For wound re-check Contact information: [place] [address] [phone]        Terald Sleeper, PA-C. Schedule an appointment as soon as possible for a visit in 1 week(s).   Specialties: Physician Assistant, Family Medicine Contact information: [place] [address] [phone]        Pixie Casino, MD .   Specialty: Cardiology Contact information: [address] [place] [address] [phone]            Contact information for after-discharge care    [facility] .   Service: Skilled Nursing Contact information: [address] [place] [phone]                   The results of significant diagnostics from this hospitalization (including imaging, microbiology, ancillary and laboratory) are listed below for reference.    Significant Diagnostic Studies: Dg Thoracic Spine 2 View  Result Date: 07/07/2019 CLINICAL DATA:  Back pain EXAM: THORACIC SPINE 2 VIEWS COMPARISON:  None. FINDINGS: Aortic calcifications are noted. The patient is status post prior vertebral augmentation of the T12 vertebral body. There is stable  height loss of the L1 vertebral body. There is age-indeterminate height loss of the T11 vertebral body as well as multiple additional thoracic vertebral bodies. IMPRESSION: 1. Age-indeterminate height loss of multiple thoracic vertebral bodies, most evident at the T11 level. If there is clinical concern for an acute fracture, follow-up with MRI is recommended. 2. Status post vertebral augmentation of the T12 vertebral body. 3. Stable height loss of the L1 vertebral body.  4.  Aortic Atherosclerosis (ICD10-I70.0). Electronically Signed   By: Constance Holster M.D.   On: 07/07/2019 17:04   Dg Lumbar Spine 2-3 Views  Result Date: 06/28/2019 CLINICAL DATA:  Recent fall with low back pain, initial encounter EXAM: LUMBAR SPINE - 2-3 VIEW COMPARISON:  CT dated 02/26/2018 FINDINGS: There are changes consistent with prior vertebral augmentation at T12. Underlying compression deformities at T11 and L1 are noted. The T11 fracture is new from the prior CT examination. The L1 fracture is chronic. Degenerative changes are noted at L3-4 somewhat progressed from the prior CT examination. No other acute abnormality is noted. Diffuse aortic calcifications are seen. IMPRESSION: Changes of prior vertebral augmentation at T12. Interval fracture at T11 when compared with the prior CT examination. This is of uncertain chronicity however. Electronically Signed   By: Inez Catalina M.D.   On: 06/28/2019 14:51   Ct Head Wo Contrast  Result Date: 06/28/2019 CLINICAL DATA:  Fall. EXAM: CT HEAD WITHOUT CONTRAST TECHNIQUE: Contiguous axial images were obtained from the base of the skull through the vertex without intravenous contrast. COMPARISON:  07/14/2016 FINDINGS: Brain: There is no evidence of acute infarct, intracranial hemorrhage, mass, midline shift, or extra-axial fluid collection. Generalized cerebral atrophy is not greater than expected for age. Patchy hypodensities involving the left greater than right cerebral hemispheric  white matter have at most mildly progressed and are nonspecific but compatible with mild chronic small vessel ischemic disease. Vascular: Calcified atherosclerosis at the skull base. No hyperdense vessel. Skull: No fracture or focal osseous lesion. Sinuses/Orbits: Visualized paranasal sinuses and mastoid air cells are clear bilateral cataract extraction is noted. Other: None. IMPRESSION: 1. No evidence of acute intracranial abnormality. 2. Mild chronic small vessel ischemic disease. Electronically Signed   By: Logan Bores M.D.   On: 06/28/2019 18:29   Ct Lumbar Spine Wo Contrast  Result Date: 07/02/2019 CLINICAL DATA:  Fall with low back pain EXAM: CT LUMBAR SPINE WITHOUT CONTRAST TECHNIQUE: Multidetector CT imaging of the lumbar spine was performed without intravenous contrast administration. Multiplanar CT image reconstructions were also generated. COMPARISON:  02/26/2018 lumbar spine CT FINDINGS: Segmentation: Normal Alignment: Rightward listhesis of L3 on L4, unchanged. Vertebrae: Status post augmentation of T12. Compression deformity of L1 is unchanged. Paraspinal and other soft tissues: Small left pleural effusion. Extensive calcific aortic atherosclerosis. There is soft tissue gas within the left thigh, likely postoperative. There is thickening of gallbladder wall. Disc levels: No bony spinal canal stenosis. Unchanged severe right L4-5 foraminal stenosis. IMPRESSION: 1. No acute abnormality of the lumbar spine. 2. Chronic compression deformity of L1, unchanged. 3. Status post T12 vertebral augmentation. 4.  Aortic atherosclerosis (ICD10-I70.0). 5. Small left pleural effusion. 6. Soft tissue gas within the left thighs likely postoperative. Electronically Signed   By: Ulyses Jarred M.D.   On: 07/02/2019 02:12   Pelvis Portable  Result Date: 06/29/2019 CLINICAL DATA:  Postoperative radiograph anterior approach left hip arthroplasty EXAM: PORTABLE PELVIS 1-2 VIEWS COMPARISON:  Intraoperative fluoroscopy  same day, radiographs June 28, 2019 FINDINGS: Interval placement of left hip hemiarthroplasty with expected alignment of the femoral articular component and left acetabulum. There are expected postsurgical soft tissue changes including gas within the joint. Transcervical fixation screws are present in proximal right femur with bony remodeling of the right femoral head which may suggest prior osteonecrosis. Vascular calcium is noted in the soft tissues. Foley catheter is in position. IMPRESSION: Interval left hip hemiarthroplasty without evidence of acute postoperative complication. Electronically Signed   By:  Lovena Le M.D.   On: 06/29/2019 19:48   Dg Chest Port 1 View  Result Date: 06/28/2019 CLINICAL DATA:  Known left hip fracture EXAM: PORTABLE CHEST 1 VIEW COMPARISON:  03/17/2019 FINDINGS: Cardiac shadow is stable. Patient is rotated significantly to the right accentuating the mediastinal markings. Aortic calcifications are seen. No acute bony abnormality is noted. Lungs are clear bilaterally. IMPRESSION: No acute abnormality noted. Electronically Signed   By: Inez Catalina M.D.   On: 06/28/2019 14:49   Dg Abd Portable 1v  Result Date: 07/07/2019 CLINICAL DATA:  Lower abdominal pain today, recent hip surgery EXAM: PORTABLE ABDOMEN - 1 VIEW COMPARISON:  10/22/2017 FINDINGS: Minimally prominent stool in transverse colon. Scattered gas and stool throughout remainder of colon, nondistended. Normal small bowel gas pattern. No bowel dilatation or bowel wall thickening. Bones demineralized with evidence of prior T12 vertebroplasty and questionable L1 compression fracture. Extensive atherosclerotic calcifications aorta and iliac arteries. LEFT hip prosthesis and cannulated screws proximal RIGHT femur noted. IMPRESSION: Nonobstructive bowel gas pattern with minimally prominent stool in transverse colon. Electronically Signed   By: Lavonia Dana M.D.   On: 07/07/2019 12:09   Dg C-arm 1-60 Min  Result Date:  06/29/2019 CLINICAL DATA:  Left hip hemiarthroplasty EXAM: DG C-ARM 61-120 MIN; OPERATIVE LEFT HIP WITH PELVIS COMPARISON:  06/28/2019 FINDINGS: Three low resolution intraoperative spot views of the left hip. Total fluoroscopy time was 6 seconds. Left femoral neck fracture. Subsequent placement of left hip replacement with normal alignment. Previous screw fixation of the right femur. IMPRESSION: Intraoperative fluoroscopic assistance provided during left hip replacement Electronically Signed   By: Donavan Foil M.D.   On: 06/29/2019 19:32   Dg Hip Operative Unilat W Or W/o Pelvis Left  Result Date: 06/29/2019 CLINICAL DATA:  Left hip hemiarthroplasty EXAM: DG C-ARM 61-120 MIN; OPERATIVE LEFT HIP WITH PELVIS COMPARISON:  06/28/2019 FINDINGS: Three low resolution intraoperative spot views of the left hip. Total fluoroscopy time was 6 seconds. Left femoral neck fracture. Subsequent placement of left hip replacement with normal alignment. Previous screw fixation of the right femur. IMPRESSION: Intraoperative fluoroscopic assistance provided during left hip replacement Electronically Signed   By: Donavan Foil M.D.   On: 06/29/2019 19:32   Dg Hip Unilat W Or W/o Pelvis 2-3 Views Left  Result Date: 06/28/2019 CLINICAL DATA:  Hip pain after recent fall EXAM: DG HIP (WITH OR WITHOUT PELVIS) 2-3V LEFT; DG HIP (WITH OR WITHOUT PELVIS) 2-3V RIGHT COMPARISON:  06/15/2019 FINDINGS: There is an acute fracture of the left femoral neck, which appears predominantly subcapital. There is at least 2.5 cm of foreshortening and 1.5 cm of lateral displacement. Left femoroacetabular joint remains aligned without dislocation. Chronic deformity of the proximal right femur status post ORIF with intact hardware. The osseous structures are diffusely demineralized. No additional acute fractures are identified. SI joints and pubic symphysis are intact with mild degenerative changes. Advanced aortoiliac atherosclerotic calcifications.  IMPRESSION: Acute, moderately displaced, foreshortened fracture of the left femoral neck. These results will be called to the ordering clinician or representative by the Radiologist Assistant, and communication documented in the PACS or zVision Dashboard. Electronically Signed   By: Davina Poke M.D.   On: 06/28/2019 11:46   Dg Hip Unilat W Or W/o Pelvis 2-3 Views Right  Result Date: 06/28/2019 CLINICAL DATA:  Hip pain after recent fall EXAM: DG HIP (WITH OR WITHOUT PELVIS) 2-3V LEFT; DG HIP (WITH OR WITHOUT PELVIS) 2-3V RIGHT COMPARISON:  06/15/2019 FINDINGS: There is an acute  fracture of the left femoral neck, which appears predominantly subcapital. There is at least 2.5 cm of foreshortening and 1.5 cm of lateral displacement. Left femoroacetabular joint remains aligned without dislocation. Chronic deformity of the proximal right femur status post ORIF with intact hardware. The osseous structures are diffusely demineralized. No additional acute fractures are identified. SI joints and pubic symphysis are intact with mild degenerative changes. Advanced aortoiliac atherosclerotic calcifications. IMPRESSION: Acute, moderately displaced, foreshortened fracture of the left femoral neck. These results will be called to the ordering clinician or representative by the Radiologist Assistant, and communication documented in the PACS or zVision Dashboard. Electronically Signed   By: Davina Poke M.D.   On: 06/28/2019 11:46   Dg Hip Unilat With Pelvis 2-3 Views Right  Result Date: 06/15/2019 [facility] x-rays right hip with pelvis Status post internal fixation on Mar 18, 2019 she is almost at the 72-month. X-rays show no evidence of avascular necrosis or nonunion Fracture appears to be healing Should be noted that the fracture has a mushroom appearance to the head which is been persistent since the surgery No hardware complications Impression healed right hip fracture with stable internal  fixation   Microbiology: No results found for this or any previous visit (from the past 240 hour(s)).   Labs: Basic Metabolic Panel: Recent Labs  Lab 07/05/19 1029 07/06/19 0346 07/06/19 0817 07/07/19 0238 07/08/19 0321  NA 136  --  136 133* 136  K 4.2  --  4.5 4.4 4.9  CL 96*  --  96* 97* 98  CO2 28  --  28 25 29   GLUCOSE 165*  --  126* 129* 120*  BUN 16  --  17 19 25*  CREATININE 0.60 0.63 0.64 0.59 0.64  CALCIUM 9.1  --  9.3 8.5* 8.5*  MG 1.9  --   --   --   --    Liver Function Tests: No results for input(s): AST, ALT, ALKPHOS, BILITOT, PROT, ALBUMIN in the last 168 hours. No results for input(s): LIPASE, AMYLASE in the last 168 hours. No results for input(s): AMMONIA in the last 168 hours. CBC: Recent Labs  Lab 07/05/19 1029 07/06/19 0817 07/07/19 0238 07/08/19 0321 07/09/19 0236  WBC 9.7 10.4 10.6* 10.3 9.8  NEUTROABS 8.1* 8.6* 8.5* 8.1* 7.8*  HGB 12.6 11.8* 10.4* 10.1* 10.8*  HCT 39.6 36.9 32.5* 31.5* 34.5*  MCV 91.9 90.9 90.0 91.3 93.2  PLT 352 339 328 349 361   Cardiac Enzymes: No results for input(s): CKTOTAL, CKMB, CKMBINDEX, TROPONINI in the last 168 hours. BNP: BNP (last 3 results) No results for input(s): BNP in the last 8760 hours.  ProBNP (last 3 results) No results for input(s): PROBNP in the last 8760 hours.  CBG: No results for input(s): GLUCAP in the last 168 hours.     Signed:  Alma Friendly, MD [facility] 07/11/2019, 11:37 AM

## 2019-07-11 NOTE — Progress Notes (Signed)
1000: Call to daughter. Updated on condition and pending  1310: Report given to nurse Delfina.  1516: Patient discharged. Personal belongings and discharge summary given to [facility]

## 2019-07-11 NOTE — Care Management Important Message (Signed)
Important Message  Patient Details  Name: Betty Carpenter MRN: [id number] Date of Birth: July 15, 1925   Medicare Important Message Given:  Yes     [name] Circle 07/11/2019, 3:43 PM

## 2019-07-11 NOTE — TOC Transition Note (Addendum)
Transition of Care [facility]) - CM/SW Discharge Note   Patient Details  Name: Betty Carpenter MRN: [id number] Date of Birth: 1925/04/04  [facility] CM/SW Contact:  Sharin Mons, RN Phone Number: 07/11/2019, 12:57 PM   Clinical Narrative:     Patient will DC to: [facility] Anticipated DC date: 07/11/2019 Family notified: Enid Derry ( daughter) Transport by: Corey Harold   Per MD patient ready for DC to SNF/ [facility] . RN, patient, patient's family, and facility notified of DC. Discharge Summary and FL2 sent to facility. RN to call report prior to discharge [phone] ).  Room # 154.DC packet on chart. Ambulance transport requested for patient.   RNCM will sign off for now as intervention is no longer needed. Please consult [place] again if new needs arise.   [name] (Daughter)      [phone]         Final next level of care: Skilled Nursing Facility Barriers to Discharge: No Barriers Identified   Patient Goals and CMS Choice Patient states their goals for this hospitalization and ongoing recovery are:: Pt daughter is agreeable to rehab CMS Medicare.gov Compare Post Acute Care list provided to:: Patient Represenative (must comment) Choice offered to / list presented to : Adult Children  Discharge Placement              Patient chooses bed at: [facility] Patient to be transferred to facility by: [place] Name of family member notified: Enid Derry Patient and family notified of of transfer: 07/11/19  Discharge Plan and Services In-house Referral: Clinical Social Work Discharge Planning Services: NA Post Acute Care Choice: [place]          DME Arranged: N/A DME Agency: NA       HH Arranged: NA [place] Agency: NA        Social Determinants of Health ([place]) Interventions     Readmission Risk Interventions Readmission Risk Prevention Plan 03/22/2019  Transportation Screening Complete  PCP or Specialist Appt within 5-7 Days Complete   Home Care Screening Complete  Medication Review (RN CM) Complete  Some recent data might be hidden

## 2019-07-12 ENCOUNTER — Encounter: Payer: Self-pay | Admitting: Adult Health

## 2019-07-12 ENCOUNTER — Non-Acute Institutional Stay (SKILLED_NURSING_FACILITY): Payer: Medicare Other | Admitting: Adult Health

## 2019-07-12 DIAGNOSIS — E559 Vitamin D deficiency, unspecified: Secondary | ICD-10-CM

## 2019-07-12 DIAGNOSIS — M5136 Other intervertebral disc degeneration, lumbar region: Secondary | ICD-10-CM

## 2019-07-12 DIAGNOSIS — L97319 Non-pressure chronic ulcer of right ankle with unspecified severity: Secondary | ICD-10-CM

## 2019-07-12 DIAGNOSIS — E440 Moderate protein-calorie malnutrition: Secondary | ICD-10-CM

## 2019-07-12 DIAGNOSIS — I83013 Varicose veins of right lower extremity with ulcer of ankle: Secondary | ICD-10-CM

## 2019-07-12 DIAGNOSIS — C50911 Malignant neoplasm of unspecified site of right female breast: Secondary | ICD-10-CM

## 2019-07-12 DIAGNOSIS — K5903 Drug induced constipation: Secondary | ICD-10-CM

## 2019-07-12 DIAGNOSIS — I4891 Unspecified atrial fibrillation: Secondary | ICD-10-CM | POA: Diagnosis not present

## 2019-07-12 DIAGNOSIS — S72002A Fracture of unspecified part of neck of left femur, initial encounter for closed fracture: Secondary | ICD-10-CM

## 2019-07-12 DIAGNOSIS — T402X5A Adverse effect of other opioids, initial encounter: Secondary | ICD-10-CM

## 2019-07-12 DIAGNOSIS — K219 Gastro-esophageal reflux disease without esophagitis: Secondary | ICD-10-CM | POA: Diagnosis not present

## 2019-07-12 DIAGNOSIS — I10 Essential (primary) hypertension: Secondary | ICD-10-CM | POA: Diagnosis not present

## 2019-07-12 NOTE — Progress Notes (Signed)
Location:    [facility] Room Number: 154/W Place of Service:  SNF (31)   CODE STATUS: DNR  Allergies  Allergen Reactions  . Cymbalta [Duloxetine Hcl]     " couldn't make water"- unable to urinate    Chief Complaint  Patient presents with  . Hospitalization Follow-up    Hospitalization Follow Visit    HPI:  She is a 85 year old resident of assisted living who has been hospitalized from 06-28-19 through 07-11-19. She had a fall and suffered a left hip fracture and on 06-29-19 had a left hip hemiarthroplasty. She is here for short term rehab with her goal to return back to assisted living. Upon entering the room she started saying "I hurt; I hurt; I hurt". I then questioned her regarding her pain she denied pain in her back shoulders; head; bilateral hips; and legs. She denies any nausea or vomiting. She will continue to followed for her chronic illnesses: hypertension; gerd; afib.   Past Medical History:  Diagnosis Date  . Arthritis   . Chronic back pain   . Closed wedge compression fracture of L1 vertebra ([place]) 2004  . Hypertension   . Sciatica     Past Surgical History:  Procedure Laterality Date  . ANTERIOR APPROACH HEMI HIP ARTHROPLASTY Left 06/29/2019   Procedure: Left ANTERIOR APPROACH HEMI HIP ARTHROPLASTY;  Surgeon: Leandrew Koyanagi, MD;  Location: [facility];  Service: Orthopedics;  Laterality: Left;  . APPENDECTOMY    . HIP PINNING,CANNULATED Right 03/18/2019   Procedure: CANNULATED HIP PINNING;  Surgeon: Carole Civil, MD;  Location: [facility];  Service: Orthopedics;  Laterality: Right;  11 am     Social History   Socioeconomic History  . Marital status: Married    Spouse name: Not on file  . Number of children: Not on file  . Years of education: Not on file  . Highest education level: Not on file  Occupational History  . Not on file  Social Needs  . Financial resource strain: Not on file  . Food insecurity    Worry: Not on file   Inability: Not on file  . Transportation needs    Medical: Not on file    Non-medical: Not on file  Tobacco Use  . Smoking status: Never Smoker  . Smokeless tobacco: Never Used  Substance and Sexual Activity  . Alcohol use: Not on file  . Drug use: Not on file  . Sexual activity: Never  Lifestyle  . Physical activity    Days per week: Not on file    Minutes per session: Not on file  . Stress: Not on file  Relationships  . Social Herbalist on phone: Not on file    Gets together: Not on file    Attends religious service: Not on file    Active member of club or organization: Not on file    Attends meetings of clubs or organizations: Not on file    Relationship status: Not on file  . Intimate partner violence    Fear of current or ex partner: Not on file    Emotionally abused: Not on file    Physically abused: Not on file    Forced sexual activity: Not on file  Other Topics Concern  . Not on file  Social History Narrative   Daughter, Enid Derry, is HCPOA.  Patient is a nonsmoker, nondrinker.     Family History  Problem Relation Age of Onset  .  Cancer Maternal Grandfather       VITAL SIGNS BP 113/72   Pulse 89   Temp (!) 96.8 F (36 C) (Oral)   Resp 20   Ht 5\' 11"  (1.803 m)   Wt 126 lb 3.2 oz (57.2 kg)   BMI 17.60 kg/m   Outpatient Encounter Medications as of 07/12/2019  Medication Sig  . acetaminophen (TYLENOL) 325 MG tablet Take 650 mg by mouth every 4 (four) hours as needed.   Marland Kitchen apixaban (ELIQUIS) 2.5 MG TABS tablet Take 2.5 mg by mouth 2 (two) times daily.   . celecoxib (CELEBREX) 200 MG capsule Take 1 capsule (200 mg total) by mouth 2 (two) times daily.  . collagenase (SANTYL) ointment Apply to right malleolar wound per tx order.  . digoxin (LANOXIN) 0.25 MG tablet Take 1 tablet (0.25 mg total) by mouth daily.  Marland Kitchen diltiazem (CARDIZEM CD) 240 MG 24 hr capsule Take 1 capsule (240 mg total) by mouth daily.  Mariane Baumgarten Sodium 100 MG capsule Take 100 mg by  mouth 2 (two) times daily.  . feeding supplement, ENSURE ENLIVE, (ENSURE ENLIVE) LIQD Take 237 mLs by mouth 2 (two) times daily between meals.  Marland Kitchen HYDROcodone-acetaminophen (NORCO) 10-325 MG tablet Take 1 tablet by mouth every 6 (six) hours as needed.  . lidocaine (LIDODERM) 5 % Place 1 patch onto the skin daily. Remove & Discard patch within 12 hours or as directed by MD  . loperamide (IMODIUM A-D) 2 MG tablet Take 2 mg by mouth 4 (four) times daily as needed for diarrhea or loose stools.  . lubiprostone (AMITIZA) 24 MCG capsule Take 24 mcg by mouth 2 (two) times daily.  . magnesium hydroxide (MILK OF MAGNESIA) 400 MG/5ML suspension Take 30 mLs by mouth 2 (two) times daily as needed for mild constipation.  . metoprolol tartrate (LOPRESSOR) 50 MG tablet Take 1 tablet (50 mg total) by mouth 2 (two) times daily.  . Multiple Vitamins-Minerals (MULTIVITAMIN WITH MINERALS) tablet Take 1 tablet by mouth daily.  . NON FORMULARY Diet Type: Regular,   NAS, Consistent Carbohydrate  . ondansetron (ZOFRAN-ODT) 4 MG disintegrating tablet Take 4 mg by mouth every 6 (six) hours as needed for nausea or vomiting.   . pantoprazole (PROTONIX) 20 MG tablet Take 1 tablet (20 mg total) by mouth daily.  Marland Kitchen tiZANidine (ZANAFLEX) 4 MG tablet Take 4 mg by mouth every 6 (six) hours as needed for muscle spasms.  Marland Kitchen UNABLE TO FIND Wound Care -   Clean wound right malleolar with N/S, apply santyl ointment and cover with allevyn foam dressing and change daily and as needed  . Vitamin D, Ergocalciferol, (DRISDOL) 50000 units CAPS capsule Take 50,000 Units by mouth See admin instructions. Twice a week on Tues, Saturday   No facility-administered encounter medications on file as of 07/12/2019.      SIGNIFICANT DIAGNOSTIC EXAMS   PREVIOUS:   03-17-19: right hip x-ray; impacted femoral neck fracture on the right   03-17-19: chest x-ray; Emphysematous changes without infiltrate.  TODAY:   06-28-19: lumbar spine x-ray: Changes  of prior vertebral augmentation at T12. Interval fracture at T11 when compared with the prior CT  examination. This is of uncertain chronicity however.  06-28-19: left hip and pelvic x-ray: Acute, moderately displaced, foreshortened fracture of the left femoral neck  06-28-19: right hip and pelvic x-ray: Acute, moderately displaced, foreshortened fracture of the left femoral neck.  06-28-19: chest x-ray; No acute abnormality noted.   06-28-19: ct of head: 1. No  evidence of acute intracranial abnormality. 2. Mild chronic small vessel ischemic disease.  06-29-19: pelvic x-ray: Interval left hip hemiarthroplasty without evidence of acute postoperative complication  [id number]: ct of lumbar spine:  1. No acute abnormality of the lumbar spine. 2. Chronic compression deformity of L1, unchanged. 3. Status post T12 vertebral augmentation. 4.  Aortic atherosclerosis  5. Small left pleural effusion. 6. Soft tissue gas within the left thighs likely postoperative.  07-07-19: kub: Nonobstructive bowel gas pattern with minimally prominent stool intransverse colon.   LABS REVIEWED PREVIOUS;   03-17-19: wbc 5.1; hgb 12.0; hct 38.5; mcv 93.2 plt 189; glucose 116; bun 21; creat 0.83; k+3.8; na++ 139; ca 9.0  03-18-19: wbc 4.8; hgb 11.1; hct 34.7; mcv 90.8 ;plt 167; glucose 106; bun 17; creat 0.65; k+ 3.5; na++ 140; ca 8.6 03-20-19: wbc 6.0; hgb 11.7; hct 36.6; mcv 90.6; plt 177; glucose 120; bun 13; creat 0.61; k+ 3.7; na ++ 139; ca 8.6  03-21-19: wbc 4.8; hgb 11.0; hct 34.4; mcv 91.5 plt 172;  03-28-19: wbc 5.2; hgb 11.8 hct 35. 9 mcv 90.0 plt 341; glucose 104; bun 30; creat 0.92; k+ 4.1; na++ 136; ca 9.3   TODAY;   06-28-19: wbc 11.8; hgb 13.0; hct 41.3; mcv 92.0; plt 341; glucose 124; bun 59; creat 1.03 k+ 3.6; na++ 139; ca 9.4; mag 2.3; tsh 1.230 06-30-19: wbc 11.8; hgb 11.4; hct 35.7; mcv 91.8; plt 318; glucose 147; bun  19; creat 0.61; k+ 4.3; na++ 140; ca 9.1 07-05-19: wbc 9.7; hgb 12.6; hct 39.6; mcv 91.9; plt  352; glucose 165; bun 16; creat 0.60; k+ 4.2; na++ 136; ca 9.1; mag 1.9 07-09-19: wbc 9.8; hgb 10.8; hct 34.5; mcv 93.2; plt 361   Review of Systems  Reason unable to perform ROS: poor historian.  Constitutional: Negative for malaise/fatigue.  Respiratory: Negative for cough.   Cardiovascular: Negative for chest pain.  Gastrointestinal: Negative for abdominal pain.  Musculoskeletal: Negative for back pain, joint pain and myalgias.       Denies pain at this time   Skin: Negative.   Psychiatric/Behavioral: The patient is not nervous/anxious.     Physical Exam Constitutional:      General: She is not in acute distress.    Appearance: She is underweight. She is not diaphoretic.  Neck:     Musculoskeletal: Neck supple.     Thyroid: No thyromegaly.  Cardiovascular:     Rate and Rhythm: Normal rate. Rhythm irregular.     Pulses: Normal pulses.     Heart sounds: Normal heart sounds.  Pulmonary:     Effort: Pulmonary effort is normal. No respiratory distress.     Breath sounds: Normal breath sounds.  Chest:     Comments: History of right mastectomy  Abdominal:     General: Bowel sounds are normal. There is no distension.     Palpations: Abdomen is soft.     Tenderness: There is no abdominal tenderness.  Musculoskeletal:     Right lower leg: No edema.     Left lower leg: No edema.     Comments:  Is able to move all extremities 06-29-19: left hip hemiarthroplasty  03-18-19: right hip pinning      Lymphadenopathy:     Cervical: No cervical adenopathy.  Skin:    General: Skin is warm and dry.     Comments: Right medial ankle ulceration without signs of infection present Left hip incision without signs of infection present.   Neurological:  Mental Status: She is alert. Mental status is at baseline.  Psychiatric:        Mood and Affect: Mood normal.     ASSESSMENT/ PLAN:  TODAY:   1. Atrial fibrillation with rapid ventricular response: heart rate is stable will continue  digoxin 0.25 mg daily; cardizem cd 240 mg;  and lopressor 50 mg twice daily for rate control will continue eliquis 2.5 mg twice daily for anticoagulation therapy.   2. Benign essential HTN: is stable b/p  113/72 will continue lopressor 50 mg twice daily   3.  GERD without esophagitis: is stable will continue protonix 20 mg daily   4. Constipation due to opioid therapy: is stable will continue colace twice daily amitiza 24 mcg twice daily   5. Venous ulcer of right ankle: is without change: will continue current treatment and will monitor   6. DDD (degenerative disc disease) lumbar spine: is stable will continue celebrex 200 mg twice daily uses lidoderm patch daily   7.  Closed displaced fracture left femoral neck: is stable will continue therapy as directed and will follow up with orthopedics; will continue zanaflex 4 mg every 6 hours as needed; will continue vicodin 5/ 325 mg every 6 hours as needed through 07-18-19 will monitor her status.   8. Vit D deficiency is stable will continue vit D 50,000 units twice daily weekly   9. Malignant neoplasm of right breast stage 1 unspecified estrogen receptor unspecified: is stable is status post right mastectomy   10. Malnutrition of moderate degree: is without change will continue ensure twice daily   The goal of her care is to return back to her assisted living.    MD is aware of resident's narcotic use and is in agreement with current plan of care. We will attempt to wean resident as apropriate   Ok Edwards NP [facility]  Contact [phone] Monday through Friday 8am- 5pm  After hours call [phone]

## 2019-07-13 ENCOUNTER — Non-Acute Institutional Stay (SKILLED_NURSING_FACILITY): Payer: Medicare Other | Admitting: Internal Medicine

## 2019-07-13 ENCOUNTER — Encounter: Payer: Self-pay | Admitting: Internal Medicine

## 2019-07-13 DIAGNOSIS — E559 Vitamin D deficiency, unspecified: Secondary | ICD-10-CM

## 2019-07-13 DIAGNOSIS — M545 Low back pain: Secondary | ICD-10-CM | POA: Diagnosis not present

## 2019-07-13 DIAGNOSIS — I4891 Unspecified atrial fibrillation: Secondary | ICD-10-CM | POA: Diagnosis not present

## 2019-07-13 DIAGNOSIS — K219 Gastro-esophageal reflux disease without esophagitis: Secondary | ICD-10-CM

## 2019-07-13 DIAGNOSIS — G8929 Other chronic pain: Secondary | ICD-10-CM

## 2019-07-13 DIAGNOSIS — S72002A Fracture of unspecified part of neck of left femur, initial encounter for closed fracture: Secondary | ICD-10-CM

## 2019-07-13 DIAGNOSIS — K5903 Drug induced constipation: Secondary | ICD-10-CM | POA: Diagnosis not present

## 2019-07-13 DIAGNOSIS — T402X5A Adverse effect of other opioids, initial encounter: Secondary | ICD-10-CM

## 2019-07-13 NOTE — Progress Notes (Signed)
: Provider:   Location:  [facility] Room Number: 154-W Place of Service:     PCP: Terald Sleeper, PA-C Patient Care Team: Theodoro Clock as PCP - General (Physician Assistant) Pixie Casino, MD as PCP - Cardiology (Cardiology)  Extended Emergency Contact Information Primary Emergency Contact: DIAZ,SHIRLEY Address: [address]          [place], [place] [address] [place] Phone: [phone] Mobile Phone: [phone] Relation: Daughter     Allergies: Cymbalta [duloxetine hcl]  Chief Complaint  Patient presents with  . New Admit To SNF    New admission to [facility] SNF     HPI: Patient is 85 y.o. female  history of breast cancer with right mastectomy, hypertension, who was brought to a knee pain ED reporting a fall that it happened about 4 days ago.  Patient could not remember any of the details of the fall.  Per patient's daughter patient fell backwards on her buttocks.  At baseline patient walks with a walker.  X-ray of the hip revealed left femoral neck fracture.  In the ED patient received 7 mg of morphine with oxygen and patient desatted in the 80s, was placed on nonrebreather and given Narcan.  In the ED she is found to have atrial fib with RVR with rates in the 160s and 170s.  TSH 1.2, COVID-19 negative.  Patient was admitted to [facility] from 8/11-24 where she underwent a hemi-arthroplasty on 8/12..  Patient has chronic low back pain which is not new, otherwise there were no complications.  Patient is admitted to skilled nursing facility for OT/PT.  While at skilled nursing facility patient will be followed for constipation treated with amitiza, GERD treated with Protonix and vitamin D deficiency treated with replacement.  Past Medical History:  Diagnosis Date  . Arthritis   . Chronic back pain   . Closed wedge compression fracture of L1 vertebra ([place]) 2004  . Hypertension   . Sciatica     Past Surgical History:   Procedure Laterality Date  . ANTERIOR APPROACH HEMI HIP ARTHROPLASTY Left 06/29/2019   Procedure: Left ANTERIOR APPROACH HEMI HIP ARTHROPLASTY;  Surgeon: Leandrew Koyanagi, MD;  Location: [facility];  Service: Orthopedics;  Laterality: Left;  . APPENDECTOMY    . HIP PINNING,CANNULATED Right 03/18/2019   Procedure: CANNULATED HIP PINNING;  Surgeon: Carole Civil, MD;  Location: [facility];  Service: Orthopedics;  Laterality: Right;  11 am     Allergies as of 07/13/2019      Reactions   Cymbalta [duloxetine Hcl]    " couldn't make water"- unable to urinate      Medication List    Notice   This visit is during an admission. Changes to the med list made in this visit will be reflected in the After Visit Summary of the admission.     No orders of the defined types were placed in this encounter.   Immunization History  Administered Date(s) Administered  . Influenza Split 09/13/2014  . PPD Test 04/04/2019  . Pneumococcal Conjugate-13 04/22/2017  . Pneumococcal Polysaccharide-23 08/05/2018    Social History   Tobacco Use  . Smoking status: Never Smoker  . Smokeless tobacco: Never Used  Substance Use Topics  . Alcohol use: Not on file    Family history is   Family History  Problem Relation Age of Onset  . Cancer Maternal Grandfather       Review of  Systems  DATA OBTAINED: from patient, nurse GENERAL:  no fevers, fatigue, appetite changes SKIN: No itching, or rash EYES: No eye pain, redness, discharge EARS: No earache, tinnitus, change in hearing NOSE: No congestion, drainage or bleeding  MOUTH/THROAT: No mouth or tooth pain, No sore throat RESPIRATORY: No cough, wheezing, SOB CARDIAC: No chest pain, palpitations, lower extremity edema  GI: No abdominal pain, No N/V/D or constipation, No heartburn or reflux  GU: No dysuria, frequency or urgency, or incontinence  MUSCULOSKELETAL: No unrelieved bone/joint pain NEUROLOGIC: No headache, dizziness or focal weakness PSYCHIATRIC:  No c/o anxiety or sadness   Vitals:   07/13/19 0949  BP: 126/74  Pulse: 66  Resp: 20  Temp: 98.3 F (36.8 C)    SpO2 Readings from Last 1 Encounters:  07/11/19 100%   Body mass index is 17.6 kg/m.     Physical Exam  GENERAL APPEARANCE: Alert, conversant,  No acute distress.  SKIN: No diaphoresis rash HEAD: Normocephalic, atraumatic  EYES: Conjunctiva/lids clear. Pupils round, reactive. EOMs intact.  EARS: External exam WNL, canals clear. Hearing grossly normal.  NOSE: No deformity or discharge.  MOUTH/THROAT: Lips w/o lesions  RESPIRATORY: Breathing is even, unlabored. Lung sounds are clear   ; Chest: Status post right mastectomy CARDIOVASCULAR: Heart RRR no murmurs, rubs or gallops. No peripheral edema.   GASTROINTESTINAL: Abdomen is soft, non-tender, not distended w/ normal bowel sounds. GENITOURINARY: Bladder non tender, not distended  MUSCULOSKELETAL: No abnormal joints or musculature NEUROLOGIC:  Cranial nerves 2-12 grossly intact. Moves all extremities  PSYCHIATRIC: Mood and affect appropriate to situation with dementia, no behavioral issues  Patient Active Problem List   Diagnosis Date Noted  . Malnutrition of moderate degree 07/01/2019  . Closed displaced fracture of left femoral neck ([place]) 06/28/2019  . Atrial fibrillation with rapid ventricular response ([place]) 06/28/2019  . DDD (degenerative disc disease), lumbar 06/15/2019  . HTN (hypertension) 06/15/2019  . Hx: UTI (urinary tract infection) 06/15/2019  . Lumbar spinal stenosis 06/15/2019  . Lumbar spondylolysis 06/15/2019  . Osteoarthrosis 06/15/2019  . Venous ulcer of ankle, right ([place]) 04/07/2019  . GERD without esophagitis 03/23/2019  . Constipation due to opioid therapy 03/23/2019  . Vitamin D deficiency 03/23/2019  . S/P right hip fracture with cannulated screw placement 03/18/19   . Anxiety   . Pressure injury of skin 03/18/2019  . Closed right hip fracture ([place]) 03/17/2019  . Sinus tachycardia  03/17/2019  . Arterial leg ulcer ([place]) 11/02/2018  . Lymphedema 11/02/2018  . Closed compression fracture of thoracic vertebra ([place]) 03/15/2018  . History of breast cancer 10/23/2017  . History of DVT (deep vein thrombosis) 10/23/2017  . Chronic back pain 10/23/2017  . Benign essential HTN 10/23/2017  . Hypertensive urgency 10/23/2017  . Chronic, continuous use of opioids 10/23/2017  . Anemia 08/18/2016  . Chronic fatigue 05/13/2016  . Pernicious anemia 05/13/2016  . Dyslipidemia 01/15/2016  . History of right mastectomy 09/17/2015  . Conjunctival hemorrhage, left eye 02/13/2015  . Mixed incontinence 11/28/2014  . Breast cancer, stage 1 ([place]) 01/28/2012      Labs reviewed: Basic Metabolic Panel:    Component Value Date/Time   NA 136 07/08/2019 0321   K 4.9 07/08/2019 0321   CL 98 07/08/2019 0321   CO2 29 07/08/2019 0321   GLUCOSE 120 (H) 07/08/2019 0321   BUN 25 (H) 07/08/2019 0321   CREATININE 0.64 07/08/2019 0321   CALCIUM 8.5 (L) 07/08/2019 0321   PROT 6.2 (L) 04/04/2019 0600   ALBUMIN  3.7 04/04/2019 0600   AST 19 04/04/2019 0600   ALT 14 04/04/2019 0600   ALKPHOS 89 04/04/2019 0600   BILITOT 0.5 04/04/2019 0600   GFRNONAA >60 07/08/2019 0321   GFRAA >60 07/08/2019 0321    Recent Labs    06/28/19 2018  07/03/19 1002 07/05/19 1029  07/06/19 0817 07/07/19 0238 07/08/19 0321  NA  --    < > 139 136  --  136 133* 136  K  --    < > 3.9 4.2  --  4.5 4.4 4.9  CL  --    < > 104 96*  --  96* 97* 98  CO2  --    < > 25 28  --  28 25 29   GLUCOSE  --    < > 135* 165*  --  126* 129* 120*  BUN  --    < > 17 16  --  17 19 25*  CREATININE  --    < > 0.53 0.60   < > 0.64 0.59 0.64  CALCIUM  --    < > 8.8* 9.1  --  9.3 8.5* 8.5*  MG 2.3  --  1.9 1.9  --   --   --   --    < > = values in this interval not displayed.   Liver Function Tests: Recent Labs    04/04/19 0600  AST 19  ALT 14  ALKPHOS 89  BILITOT 0.5  PROT 6.2*  ALBUMIN 3.7   No results for input(s):  LIPASE, AMYLASE in the last 8760 hours. No results for input(s): AMMONIA in the last 8760 hours. CBC: Recent Labs    07/07/19 0238 07/08/19 0321 07/09/19 0236  WBC 10.6* 10.3 9.8  NEUTROABS 8.5* 8.1* 7.8*  HGB 10.4* 10.1* 10.8*  HCT 32.5* 31.5* 34.5*  MCV 90.0 91.3 93.2  PLT 328 349 361   Lipid No results for input(s): CHOL, HDL, LDLCALC, TRIG in the last 8760 hours.  Cardiac Enzymes: No results for input(s): CKTOTAL, CKMB, CKMBINDEX, TROPONINI in the last 8760 hours. BNP: No results for input(s): BNP in the last 8760 hours. No results found for: MICROALBUR No results found for: HGBA1C Lab Results  Component Value Date   TSH 1.230 06/28/2019   No results found for: VITAMINB12 No results found for: FOLATE No results found for: IRON, TIBC, FERRITIN  Imaging and Procedures obtained prior to SNF admission: No results found.   Not all labs, radiology exams or other studies done during hospitalization come through on my EPIC note; however they are reviewed by me.    Assessment and Plan  Left closed femoral neck fracture-status post hemiarthroplasty on 8/12; DVT prophylaxis with Lovenox SNF- admitted for OT/PT; will continue Lovenox 40 mg subcu daily for 3 weeks  New onset atrial fib with RVR-troponins negative, echo with preserved EF SNF- we will continue patient on Lovenox and transition to Eliquis; continue with metoprolol 50 twice daily, diltiazem 240 mg daily and digoxin 0.25 mg daily; will check dig level in a month   Low back pain-chronic; no prior SNF patient took oxycodone 20 mg twice daily but due to recent fall and desaturation on admission and soft BP this was not restarted.  Patient is being treated instead with Norco 1 tablet every 6 hours as needed and lidocaine 5% patch to skin daily SNF- continue treatment as above; challenge will be to wean Norco to nothing  Constipation SNF- stable; continue Amitiza 25 mcg daily  GERD SNF not stated as  uncontrolled; continue Protonix 20 mg daily  Vitamin D deficiency SNF continue 50,000 units twice weekly     Time spent greater than 45 minutes;> 50% of time with patient was spent reviewing records, labs, tests and studies, counseling and developing plan of care  Hennie Duos, MD

## 2019-07-18 ENCOUNTER — Encounter: Payer: Self-pay | Admitting: Adult Health

## 2019-07-18 ENCOUNTER — Non-Acute Institutional Stay (SKILLED_NURSING_FACILITY): Payer: Medicare Other | Admitting: Adult Health

## 2019-07-18 ENCOUNTER — Encounter (HOSPITAL_COMMUNITY)
Admission: RE | Admit: 2019-07-18 | Discharge: 2019-07-18 | Disposition: A | Discharge status: Home / Self Care | Payer: Medicare Other | Source: Ambulatory Visit | Attending: Internal Medicine | Admitting: Internal Medicine

## 2019-07-18 DIAGNOSIS — M5136 Other intervertebral disc degeneration, lumbar region: Secondary | ICD-10-CM

## 2019-07-18 DIAGNOSIS — S72002A Fracture of unspecified part of neck of left femur, initial encounter for closed fracture: Secondary | ICD-10-CM

## 2019-07-18 DIAGNOSIS — Z20828 Contact with and (suspected) exposure to other viral communicable diseases: Secondary | ICD-10-CM | POA: Insufficient documentation

## 2019-07-18 LAB — SARS CORONAVIRUS 2 BY RT PCR (HOSPITAL ORDER, PERFORMED IN ~~LOC~~ HOSPITAL LAB): SARS Coronavirus 2: NEGATIVE

## 2019-07-18 NOTE — Progress Notes (Signed)
Location:    [place] Room Number: 102/P Place of Service:  SNF (31)   CODE STATUS: DNR  Allergies  Allergen Reactions  . Cymbalta [Duloxetine Hcl]     " couldn't make water"- unable to urinate    Chief Complaint  Patient presents with  . Acute Visit    Pain Management    HPI:  She is presently taking vicodin 10/325 mg for her pain management. Staff reports that there is no difference in her pain level when she takes the narcotic or not. She continually complains of pain. She is always wanting to go to bed. She has had a right fracture earlier in the year; and recently has had a left hip fracture.   Past Medical History:  Diagnosis Date  . Arthritis   . Chronic back pain   . Closed wedge compression fracture of L1 vertebra ([place]) 2004  . Hypertension   . Sciatica     Past Surgical History:  Procedure Laterality Date  . ANTERIOR APPROACH HEMI HIP ARTHROPLASTY Left 06/29/2019   Procedure: Left ANTERIOR APPROACH HEMI HIP ARTHROPLASTY;  Surgeon: Leandrew Koyanagi, MD;  Location: [place];  Service: Orthopedics;  Laterality: Left;  . APPENDECTOMY    . HIP PINNING,CANNULATED Right 03/18/2019   Procedure: CANNULATED HIP PINNING;  Surgeon: Carole Civil, MD;  Location: [facility];  Service: Orthopedics;  Laterality: Right;  11 am     Social History   Socioeconomic History  . Marital status: Married    Spouse name: Not on file  . Number of children: Not on file  . Years of education: Not on file  . Highest education level: Not on file  Occupational History  . Not on file  Social Needs  . Financial resource strain: Not on file  . Food insecurity    Worry: Not on file    Inability: Not on file  . Transportation needs    Medical: Not on file    Non-medical: Not on file  Tobacco Use  . Smoking status: Never Smoker  . Smokeless tobacco: Never Used  Substance and Sexual Activity  . Alcohol use: Not on file  . Drug use: Not on file  . Sexual activity:  Never  Lifestyle  . Physical activity    Days per week: Not on file    Minutes per session: Not on file  . Stress: Not on file  Relationships  . Social Herbalist on phone: Not on file    Gets together: Not on file    Attends religious service: Not on file    Active member of club or organization: Not on file    Attends meetings of clubs or organizations: Not on file    Relationship status: Not on file  . Intimate partner violence    Fear of current or ex partner: Not on file    Emotionally abused: Not on file    Physically abused: Not on file    Forced sexual activity: Not on file  Other Topics Concern  . Not on file  Social History Narrative   Daughter, Enid Derry, is HCPOA.  Patient is a nonsmoker, nondrinker.     Family History  Problem Relation Age of Onset  . Cancer Maternal Grandfather       VITAL SIGNS BP (!) 148/88   Pulse 73   Temp (!) 97.3 F (36.3 C) (Oral)   Resp 20   Ht 5\' 11"  (1.803 m)  Wt 124 lb 6.4 oz (56.4 kg)   BMI 17.35 kg/m   Outpatient Encounter Medications as of 07/18/2019  Medication Sig  . acetaminophen (TYLENOL) 500 MG tablet Take 1,000 mg by mouth 3 (three) times daily.  . Amino Acids-Protein Hydrolys (FEEDING SUPPLEMENT, PRO-STAT SUGAR FREE 64,) LIQD ProStat 30 ml q hs to promote wound healing Once A Day 09:00 PM  . apixaban (ELIQUIS) 2.5 MG TABS tablet Take 2.5 mg by mouth 2 (two) times daily.   Roseanne Kaufman Peru-Castor Oil (VENELEX) OINT Venelex (balsam peru-castor oil) ointment; - ; topical  Special Instructions: Apply to coccyx, sacrum and right buttock qshift & prn for blanchable erythema and prevention. Every Shift Day, Evening, Night  . celecoxib (CELEBREX) 200 MG capsule Take 1 capsule (200 mg total) by mouth 2 (two) times daily.  . collagenase (SANTYL) ointment Apply to right malleolar wound per tx order.  . digoxin (LANOXIN) 0.25 MG tablet Take 1 tablet (0.25 mg total) by mouth daily.  Marland Kitchen diltiazem (CARDIZEM CD) 240 MG  24 hr capsule Take 1 capsule (240 mg total) by mouth daily.  Mariane Baumgarten Sodium 100 MG capsule Take 100 mg by mouth 2 (two) times daily.  . feeding supplement, ENSURE ENLIVE, (ENSURE ENLIVE) LIQD Take 237 mLs by mouth 2 (two) times daily between meals.  . lidocaine (LIDODERM) 5 % Place 1 patch onto the skin daily. Remove & Discard patch within 12 hours or as directed by MD  . loperamide (IMODIUM A-D) 2 MG tablet Take 2 mg by mouth 4 (four) times daily as needed for diarrhea or loose stools.  . lubiprostone (AMITIZA) 24 MCG capsule Take 24 mcg by mouth 2 (two) times daily.  . magnesium hydroxide (MILK OF MAGNESIA) 400 MG/5ML suspension Take 30 mLs by mouth 2 (two) times daily as needed for mild constipation.  . metoprolol tartrate (LOPRESSOR) 50 MG tablet Take 1 tablet (50 mg total) by mouth 2 (two) times daily.  . Multiple Vitamins-Minerals (MULTIVITAMIN WITH MINERALS) tablet Take 1 tablet by mouth daily.  . NON FORMULARY Diet Type: Regular,   NAS, Consistent Carbohydrate  . ondansetron (ZOFRAN-ODT) 4 MG disintegrating tablet Take 4 mg by mouth every 6 (six) hours as needed for nausea or vomiting.   . pantoprazole (PROTONIX) 20 MG tablet Take 1 tablet (20 mg total) by mouth daily.  Marland Kitchen tiZANidine (ZANAFLEX) 4 MG tablet Take 4 mg by mouth every 6 (six) hours as needed for muscle spasms.  Marland Kitchen UNABLE TO FIND Wound Care -   Clean wound right malleolar with N/S, apply santyl ointment and cover with allevyn foam dressing and change daily and as needed  . Vitamin D, Ergocalciferol, (DRISDOL) 50000 units CAPS capsule Take 50,000 Units by mouth See admin instructions. Twice a week on Tues, Saturday  . [DISCONTINUED] acetaminophen (TYLENOL) 325 MG tablet Take 650 mg by mouth every 4 (four) hours as needed.   . [DISCONTINUED] HYDROcodone-acetaminophen (NORCO) 10-325 MG tablet Take 1 tablet by mouth every 6 (six) hours as needed.   No facility-administered encounter medications on file as of 07/18/2019.       SIGNIFICANT DIAGNOSTIC EXAMS   PREVIOUS:   03-17-19: right hip x-ray; impacted femoral neck fracture on the right   03-17-19: chest x-ray; Emphysematous changes without infiltrate.  06-28-19: lumbar spine x-ray: Changes of prior vertebral augmentation at T12. Interval fracture at T11 when compared with the prior CT  examination. This is of uncertain chronicity however.  06-28-19: left hip and pelvic x-ray: Acute, moderately displaced,  foreshortened fracture of the left femoral neck  06-28-19: right hip and pelvic x-ray: Acute, moderately displaced, foreshortened fracture of the left femoral neck.  06-28-19: chest x-ray; No acute abnormality noted.   06-28-19: ct of head: 1. No evidence of acute intracranial abnormality. 2. Mild chronic small vessel ischemic disease.  06-29-19: pelvic x-ray: Interval left hip hemiarthroplasty without evidence of acute postoperative complication  123XX123: ct of lumbar spine:  1. No acute abnormality of the lumbar spine. 2. Chronic compression deformity of L1, unchanged. 3. Status post T12 vertebral augmentation. 4.  Aortic atherosclerosis  5. Small left pleural effusion. 6. Soft tissue gas within the left thighs likely postoperative.  07-07-19: kub: Nonobstructive bowel gas pattern with minimally prominent stool intransverse colon.  NO NEW EXAMS    LABS REVIEWED PREVIOUS;   03-17-19: wbc 5.1; hgb 12.0; hct 38.5; mcv 93.2 plt 189; glucose 116; bun 21; creat 0.83; k+3.8; na++ 139; ca 9.0  03-18-19: wbc 4.8; hgb 11.1; hct 34.7; mcv 90.8 ;plt 167; glucose 106; bun 17; creat 0.65; k+ 3.5; na++ 140; ca 8.6 03-20-19: wbc 6.0; hgb 11.7; hct 36.6; mcv 90.6; plt 177; glucose 120; bun 13; creat 0.61; k+ 3.7; na ++ 139; ca 8.6  03-21-19: wbc 4.8; hgb 11.0; hct 34.4; mcv 91.5 plt 172;  03-28-19: wbc 5.2; hgb 11.8 hct 35. 9 mcv 90.0 plt 341; glucose 104; bun 30; creat 0.92; k+ 4.1; na++ 136; ca 9.3  06-28-19: wbc 11.8; hgb 13.0; hct 41.3; mcv 92.0; plt 341; glucose 124; bun  59; creat 1.03 k+ 3.6; na++ 139; ca 9.4; mag 2.3; tsh 1.230 06-30-19: wbc 11.8; hgb 11.4; hct 35.7; mcv 91.8; plt 318; glucose 147; bun  19; creat 0.61; k+ 4.3; na++ 140; ca 9.1 07-05-19: wbc 9.7; hgb 12.6; hct 39.6; mcv 91.9; plt 352; glucose 165; bun 16; creat 0.60; k+ 4.2; na++ 136; ca 9.1; mag 1.9 07-09-19: wbc 9.8; hgb 10.8; hct 34.5; mcv 93.2; plt 361  NO NEW LABS.    Review of Systems  Unable to perform ROS: Dementia (unable to participate )    Physical Exam Constitutional:      General: She is not in acute distress.    Appearance: She is well-developed. She is not diaphoretic.  Neck:     Musculoskeletal: Neck supple.     Thyroid: No thyromegaly.  Cardiovascular:     Rate and Rhythm: Normal rate. Rhythm irregular.     Pulses: Normal pulses.     Heart sounds: Normal heart sounds.  Pulmonary:     Effort: Pulmonary effort is normal. No respiratory distress.     Breath sounds: Normal breath sounds.  Chest:     Comments: History of right mastectomy  Abdominal:     General: Bowel sounds are normal. There is no distension.     Palpations: Abdomen is soft.     Tenderness: There is no abdominal tenderness.  Musculoskeletal:     Right lower leg: No edema.     Left lower leg: No edema.     Comments: Is able to move all extremities 06-29-19: left hip hemiarthroplasty  03-18-19: right hip pinning       Lymphadenopathy:     Cervical: No cervical adenopathy.  Skin:    General: Skin is warm and dry.     Comments: Right medial ankle ulceration without signs of infection present Left hip incision without signs of infection present  Neurological:     Mental Status: She is alert. Mental status is at baseline.  Psychiatric:        Mood and Affect: Mood normal.      ASSESSMENT/ PLAN:  TODAY:   1. Closed displaced fracture of left femoral neck 2. DDD (degenerative disc disease) lumbar spine  She is not obtaining any pain relief with the vicodin. Will stop this medication at this  time Will begin her on tylenol 1 gm three times daily and will monitor her status.       MD is aware of resident's narcotic use and is in agreement with current plan of care. We will attempt to wean resident as appropriate.  Ok Edwards NP [facility]  Contact [phone] Monday through Friday 8am- 5pm  After hours call [phone]

## 2019-07-19 ENCOUNTER — Non-Acute Institutional Stay (SKILLED_NURSING_FACILITY): Payer: Medicare Other | Admitting: Adult Health

## 2019-07-19 ENCOUNTER — Encounter: Payer: Self-pay | Admitting: Adult Health

## 2019-07-19 DIAGNOSIS — I4891 Unspecified atrial fibrillation: Secondary | ICD-10-CM

## 2019-07-19 DIAGNOSIS — M5136 Other intervertebral disc degeneration, lumbar region: Secondary | ICD-10-CM

## 2019-07-19 DIAGNOSIS — S72002A Fracture of unspecified part of neck of left femur, initial encounter for closed fracture: Secondary | ICD-10-CM

## 2019-07-19 NOTE — Progress Notes (Signed)
Location:    [place] Room Number: 102/P Place of Service:  SNF (31)   CODE STATUS: DNR  Allergies  Allergen Reactions  . Cymbalta [Duloxetine Hcl]     " couldn't make water"- unable to urinate    Chief Complaint  Patient presents with  . Medical Management of Chronic Issues       Closed displaced fracture of left femoral neck: DDD (degenerative disc disease) lumbar spine: Atrial fibrillation with rapid ventricular response:  Weekly follow up for the first 30 days post hospitalization.     HPI:  She is a 85 year old short term rehab patient being seen for the management of her chronic illnesses: left femur fracture; ddd; afib. She does have chronic pain; which remains without change after stopping her vicodin. She is on a lidoderm patch which seems not to provide her with any benefit. There are no reports of agitation; no reports of changes in her appetite; no reports of insomnia.   Past Medical History:  Diagnosis Date  . Arthritis   . Chronic back pain   . Closed wedge compression fracture of L1 vertebra ([place]) 2004  . Hypertension   . Sciatica     Past Surgical History:  Procedure Laterality Date  . ANTERIOR APPROACH HEMI HIP ARTHROPLASTY Left 06/29/2019   Procedure: Left ANTERIOR APPROACH HEMI HIP ARTHROPLASTY;  Surgeon: Leandrew Koyanagi, MD;  Location: [place];  Service: Orthopedics;  Laterality: Left;  . APPENDECTOMY    . HIP PINNING,CANNULATED Right 03/18/2019   Procedure: CANNULATED HIP PINNING;  Surgeon: Carole Civil, MD;  Location: [facility];  Service: Orthopedics;  Laterality: Right;  11 am     Social History   Socioeconomic History  . Marital status: Married    Spouse name: Not on file  . Number of children: Not on file  . Years of education: Not on file  . Highest education level: Not on file  Occupational History  . Not on file  Social Needs  . Financial resource strain: Not on file  . Food insecurity    Worry: Not on file   Inability: Not on file  . Transportation needs    Medical: Not on file    Non-medical: Not on file  Tobacco Use  . Smoking status: Never Smoker  . Smokeless tobacco: Never Used  Substance and Sexual Activity  . Alcohol use: Not on file  . Drug use: Not on file  . Sexual activity: Never  Lifestyle  . Physical activity    Days per week: Not on file    Minutes per session: Not on file  . Stress: Not on file  Relationships  . Social Herbalist on phone: Not on file    Gets together: Not on file    Attends religious service: Not on file    Active member of club or organization: Not on file    Attends meetings of clubs or organizations: Not on file    Relationship status: Not on file  . Intimate partner violence    Fear of current or ex partner: Not on file    Emotionally abused: Not on file    Physically abused: Not on file    Forced sexual activity: Not on file  Other Topics Concern  . Not on file  Social History Narrative   Daughter, Enid Derry, is HCPOA.  Patient is a nonsmoker, nondrinker.     Family History  Problem Relation Age of  Onset  . Cancer Maternal Grandfather       VITAL SIGNS BP (!) 148/88   Pulse 73   Temp (!) 97.3 F (36.3 C) (Oral)   Resp 20   Ht 5\' 11"  (1.803 m)   Wt 121 lb 3.2 oz (55 kg)   BMI 16.90 kg/m   Outpatient Encounter Medications as of 07/19/2019  Medication Sig  . acetaminophen (TYLENOL) 500 MG tablet Take 1,000 mg by mouth 3 (three) times daily.  . Amino Acids-Protein Hydrolys (FEEDING SUPPLEMENT, PRO-STAT SUGAR FREE 64,) LIQD ProStat 30 ml q hs to promote wound healing Once A Day 09:00 PM  . apixaban (ELIQUIS) 2.5 MG TABS tablet Take 2.5 mg by mouth 2 (two) times daily.   Roseanne Kaufman [place]-Castor Oil (VENELEX) OINT Venelex (balsam peru-castor oil) ointment; - ; topical  Special Instructions: Apply to coccyx, sacrum and right buttock qshift & prn for blanchable erythema and prevention. Every Shift Day, Evening, Night  .  celecoxib (CELEBREX) 200 MG capsule Take 1 capsule (200 mg total) by mouth 2 (two) times daily.  . collagenase (SANTYL) ointment Apply to right malleolar wound per tx order.  . digoxin (LANOXIN) 0.25 MG tablet Take 1 tablet (0.25 mg total) by mouth daily.  Marland Kitchen diltiazem (CARDIZEM CD) 240 MG 24 hr capsule Take 1 capsule (240 mg total) by mouth daily.  Mariane Baumgarten Sodium 100 MG capsule Take 100 mg by mouth 2 (two) times daily.  . feeding supplement, ENSURE ENLIVE, (ENSURE ENLIVE) LIQD Take 237 mLs by mouth 2 (two) times daily between meals.  . lidocaine (LIDODERM) 5 % Place 1 patch onto the skin daily. Remove & Discard patch within 12 hours or as directed by MD  . loperamide (IMODIUM A-D) 2 MG tablet Take 2 mg by mouth 4 (four) times daily as needed for diarrhea or loose stools.  . lubiprostone (AMITIZA) 24 MCG capsule Take 24 mcg by mouth 2 (two) times daily.  . magnesium hydroxide (MILK OF MAGNESIA) 400 MG/5ML suspension Take 30 mLs by mouth 2 (two) times daily as needed for mild constipation.  . metoprolol tartrate (LOPRESSOR) 50 MG tablet Take 1 tablet (50 mg total) by mouth 2 (two) times daily.  . Multiple Vitamins-Minerals (MULTIVITAMIN WITH MINERALS) tablet Take 1 tablet by mouth daily.  . NON FORMULARY Diet Type: Regular,   NAS, Consistent Carbohydrate  . ondansetron (ZOFRAN-ODT) 4 MG disintegrating tablet Take 4 mg by mouth every 6 (six) hours as needed for nausea or vomiting.   . pantoprazole (PROTONIX) 20 MG tablet Take 1 tablet (20 mg total) by mouth daily.  Marland Kitchen tiZANidine (ZANAFLEX) 4 MG tablet Take 4 mg by mouth every 6 (six) hours as needed for muscle spasms.  Marland Kitchen UNABLE TO FIND Wound Care -   Clean wound right malleolar with N/S, apply santyl ointment and cover with allevyn foam dressing and change daily and as needed  . Vitamin D, Ergocalciferol, (DRISDOL) 50000 units CAPS capsule Take 50,000 Units by mouth See admin instructions. Twice a week on Tues, Saturday   No facility-administered  encounter medications on file as of 07/19/2019.      SIGNIFICANT DIAGNOSTIC EXAMS    PREVIOUS:   03-17-19: right hip x-ray; impacted femoral neck fracture on the right   03-17-19: chest x-ray; Emphysematous changes without infiltrate.  06-28-19: lumbar spine x-ray: Changes of prior vertebral augmentation at T12. Interval fracture at T11 when compared with the prior CT  examination. This is of uncertain chronicity however.  06-28-19: left hip and  pelvic x-ray: Acute, moderately displaced, foreshortened fracture of the left femoral neck  06-28-19: right hip and pelvic x-ray: Acute, moderately displaced, foreshortened fracture of the left femoral neck.  06-28-19: chest x-ray; No acute abnormality noted.   06-28-19: ct of head: 1. No evidence of acute intracranial abnormality. 2. Mild chronic small vessel ischemic disease.  06-29-19: pelvic x-ray: Interval left hip hemiarthroplasty without evidence of acute postoperative complication  123XX123: ct of lumbar spine:  1. No acute abnormality of the lumbar spine. 2. Chronic compression deformity of L1, unchanged. 3. Status post T12 vertebral augmentation. 4.  Aortic atherosclerosis  5. Small left pleural effusion. 6. Soft tissue gas within the left thighs likely postoperative.  07-07-19: kub: Nonobstructive bowel gas pattern with minimally prominent stool intransverse colon.  NO NEW EXAMS    LABS REVIEWED PREVIOUS;   03-17-19: wbc 5.1; hgb 12.0; hct 38.5; mcv 93.2 plt 189; glucose 116; bun 21; creat 0.83; k+3.8; na++ 139; ca 9.0  03-18-19: wbc 4.8; hgb 11.1; hct 34.7; mcv 90.8 ;plt 167; glucose 106; bun 17; creat 0.65; k+ 3.5; na++ 140; ca 8.6 03-20-19: wbc 6.0; hgb 11.7; hct 36.6; mcv 90.6; plt 177; glucose 120; bun 13; creat 0.61; k+ 3.7; na ++ 139; ca 8.6  03-21-19: wbc 4.8; hgb 11.0; hct 34.4; mcv 91.5 plt 172;  03-28-19: wbc 5.2; hgb 11.8 hct 35. 9 mcv 90.0 plt 341; glucose 104; bun 30; creat 0.92; k+ 4.1; na++ 136; ca 9.3  06-28-19: wbc 11.8;  hgb 13.0; hct 41.3; mcv 92.0; plt 341; glucose 124; bun 59; creat 1.03 k+ 3.6; na++ 139; ca 9.4; mag 2.3; tsh 1.230 06-30-19: wbc 11.8; hgb 11.4; hct 35.7; mcv 91.8; plt 318; glucose 147; bun  19; creat 0.61; k+ 4.3; na++ 140; ca 9.1 07-05-19: wbc 9.7; hgb 12.6; hct 39.6; mcv 91.9; plt 352; glucose 165; bun 16; creat 0.60; k+ 4.2; na++ 136; ca 9.1; mag 1.9 07-09-19: wbc 9.8; hgb 10.8; hct 34.5; mcv 93.2; plt 361  NO NEW LABS.   Review of Systems  Unable to perform ROS: Dementia (unable to participate )    Physical Exam Constitutional:      General: She is not in acute distress.    Appearance: She is well-developed. She is not diaphoretic.  Neck:     Musculoskeletal: Neck supple.     Thyroid: No thyromegaly.  Cardiovascular:     Rate and Rhythm: Normal rate. Rhythm irregular.     Pulses: Normal pulses.     Heart sounds: Normal heart sounds.  Pulmonary:     Effort: Pulmonary effort is normal. No respiratory distress.     Breath sounds: Normal breath sounds.  Chest:     Comments: History right mastectomy  Abdominal:     General: Bowel sounds are normal. There is no distension.     Palpations: Abdomen is soft.     Tenderness: There is no abdominal tenderness.  Musculoskeletal:     Right lower leg: No edema.     Left lower leg: No edema.     Comments: Is able to move all extremities 06-29-19: left hip hemiarthroplasty  03-18-19: right hip pinning        Lymphadenopathy:     Cervical: No cervical adenopathy.  Skin:    General: Skin is warm and dry.     Comments: Right medial ankle ulceration without signs of infection present Left hip incision without signs of infection present   Neurological:     Mental Status: She is alert.  Mental status is at baseline.  Psychiatric:        Mood and Affect: Mood normal.       ASSESSMENT/ PLAN:  TODAY:   1. Closed displaced fracture of left femoral neck: is stable will continue therapy as directed; will follow up with orthopedics; will  continue zanaflex 4 mg every 6 hours as needed; tylenol 1 gm three times daily   2. DDD (degenerative disc disease) lumbar spine: is without change :will continue  celebrex 200 mg twice daily will stop her lidoderm patch as this medication is not providing her with any relief.   3. Atrial fibrillation with rapid ventricular response: is stable will continue digoxin 0.25 mg daily cardizem cd 240 mgm daily; lopressor 50 mg twice daily for rate control; is on long term eliquis 2.5 mg twice daily   PREVIOUS   4. Benign essential HTN: is stable b/p  148/88 will continue lopressor 50 mg twice daily   5.  GERD without esophagitis: is stable will continue protonix 20 mg daily   6. Constipation due to opioid therapy: is stable will continue colace twice daily amitiza 24 mcg twice daily   7. Venous ulcer of right ankle: is without change: will continue current treatment and will monitor   8. Vit D deficiency is stable will continue vit D 50,000 units twice daily weekly   9. Malignant neoplasm of right breast stage 1 unspecified estrogen receptor unspecified: is stable is status post right mastectomy   10. Malnutrition of moderate degree: is without change will continue ensure twice daily   11. Vascular dementia without behavioral disturbance: weight is 121 pounds without significant change will monitor    MD is aware of resident's narcotic use and is in agreement with current plan of care. We will attempt to wean resident as appropriate.  Ok Edwards NP [facility]  Contact [phone] Monday through Friday 8am- 5pm  After hours call [phone]

## 2019-07-23 ENCOUNTER — Encounter: Payer: Self-pay | Admitting: Internal Medicine

## 2019-07-26 ENCOUNTER — Encounter: Payer: Self-pay | Admitting: Orthopaedic Surgery

## 2019-07-26 ENCOUNTER — Non-Acute Institutional Stay (SKILLED_NURSING_FACILITY): Payer: Medicare Other | Admitting: Adult Health

## 2019-07-26 ENCOUNTER — Inpatient Hospital Stay (INDEPENDENT_AMBULATORY_CARE_PROVIDER_SITE_OTHER): Payer: Medicare Other

## 2019-07-26 ENCOUNTER — Ambulatory Visit (INDEPENDENT_AMBULATORY_CARE_PROVIDER_SITE_OTHER): Payer: Medicare Other | Admitting: Orthopaedic Surgery

## 2019-07-26 ENCOUNTER — Encounter: Payer: Self-pay | Admitting: Adult Health

## 2019-07-26 ENCOUNTER — Ambulatory Visit: Payer: Medicare Other | Admitting: Physician Assistant

## 2019-07-26 DIAGNOSIS — S72002A Fracture of unspecified part of neck of left femur, initial encounter for closed fracture: Secondary | ICD-10-CM

## 2019-07-26 DIAGNOSIS — I10 Essential (primary) hypertension: Secondary | ICD-10-CM | POA: Diagnosis not present

## 2019-07-26 DIAGNOSIS — M5136 Other intervertebral disc degeneration, lumbar region: Secondary | ICD-10-CM

## 2019-07-26 NOTE — Progress Notes (Signed)
Post-Op Visit Note   Patient: Betty Carpenter           Date of Birth: 05-14-1925           MRN: [id number] Visit Date: 07/26/2019 PCP: Terald Sleeper, PA-C   Assessment & Plan:  Chief Complaint:  Chief Complaint  Patient presents with  . Left Hip - Follow-up   Visit Diagnoses:  1. Closed displaced fracture of left femoral neck (HCC)     Plan: Patient is 4 weeks status post left partial hip replacement.  She is doing well overall and reports no pain in her left hip.  She is mainly complaining of severe chronic back pain.  She does take oxycodone for this and has been taking it for the last 20 years.  Her current nursing facility has taken her off of this for whatever reason.  From my standpoint her surgical incision is healed well without signs of infection.  She has no pain with movement of the hip.  Her x-rays are unremarkable.  Happy with how she has recovered from the surgery.  I would like to check her again in about 6 weeks with [facility] and lateral left hip x-rays.  She should continue with PT for rehab.  Questions encouraged and answered.  Follow-Up Instructions: Return in about 6 weeks (around 09/06/2019).   Orders:  Orders Placed This Encounter  Procedures  . XR HIP UNILAT W OR W/O PELVIS 2-3 VIEWS LEFT   No orders of the defined types were placed in this encounter.   Imaging: Xr Hip Unilat W Or W/o Pelvis 2-3 Views Left  Result Date: 07/26/2019 Stable partial hip replacement without complication.   PMFS History: Patient Active Problem List   Diagnosis Date Noted  . Malnutrition of moderate degree 07/01/2019  . Closed displaced fracture of left femoral neck ([place]) 06/28/2019  . Atrial fibrillation with rapid ventricular response ([place]) 06/28/2019  . DDD (degenerative disc disease), lumbar 06/15/2019  . HTN (hypertension) 06/15/2019  . Hx: UTI (urinary tract infection) 06/15/2019  . Lumbar spinal stenosis 06/15/2019  . Lumbar spondylolysis 06/15/2019  . Osteoarthrosis  06/15/2019  . Venous ulcer of ankle, right ([place]) 04/07/2019  . GERD without esophagitis 03/23/2019  . Constipation due to opioid therapy 03/23/2019  . Vitamin D deficiency 03/23/2019  . S/P right hip fracture with cannulated screw placement 03/18/19   . Anxiety   . Pressure injury of skin 03/18/2019  . Closed right hip fracture ([place]) 03/17/2019  . Sinus tachycardia 03/17/2019  . Arterial leg ulcer ([place]) 11/02/2018  . Lymphedema 11/02/2018  . Closed compression fracture of thoracic vertebra ([place]) 03/15/2018  . History of breast cancer 10/23/2017  . History of DVT (deep vein thrombosis) 10/23/2017  . Chronic back pain 10/23/2017  . Benign essential HTN 10/23/2017  . Hypertensive urgency 10/23/2017  . Chronic, continuous use of opioids 10/23/2017  . Anemia 08/18/2016  . Chronic fatigue 05/13/2016  . Pernicious anemia 05/13/2016  . Dyslipidemia 01/15/2016  . History of right mastectomy 09/17/2015  . Conjunctival hemorrhage, left eye 02/13/2015  . Mixed incontinence 11/28/2014  . Breast cancer, stage 1 ([place]) 01/28/2012   Past Medical History:  Diagnosis Date  . Arthritis   . Chronic back pain   . Closed wedge compression fracture of L1 vertebra ([place]) 2004  . Hypertension   . Sciatica     Family History  Problem Relation Age of Onset  . Cancer Maternal Grandfather     Past Surgical History:  Procedure  Laterality Date  . ANTERIOR APPROACH HEMI HIP ARTHROPLASTY Left 06/29/2019   Procedure: Left ANTERIOR APPROACH HEMI HIP ARTHROPLASTY;  Surgeon: Leandrew Koyanagi, MD;  Location: [facility];  Service: Orthopedics;  Laterality: Left;  . APPENDECTOMY    . HIP PINNING,CANNULATED Right 03/18/2019   Procedure: CANNULATED HIP PINNING;  Surgeon: Carole Civil, MD;  Location: [facility];  Service: Orthopedics;  Laterality: Right;  11 am    Social History   Occupational History  . Not on file  Tobacco Use  . Smoking status: Never Smoker  . Smokeless tobacco: Never Used  Substance and Sexual  Activity  . Alcohol use: Not on file  . Drug use: Not on file  . Sexual activity: Never

## 2019-07-26 NOTE — Progress Notes (Signed)
Location:    [facility] Room Number: 102/P Place of Service:  SNF (31)   CODE STATUS: DNR  Allergies  Allergen Reactions  . Cymbalta [Duloxetine Hcl]     " couldn't make water"- unable to urinate    Chief Complaint  Patient presents with  . Medical Management of Chronic Issues         DDD (Degenerative disc disease) lumbar spine with without change:  Closed displaced fracture of left femoral neck:  Benign essential HTN:  Weekly follow up for the first 30 days post hospitalization.     HPI:  She is a 85 year old short term rehab patient being seen for management of her chronic illnesses: DDD; left femoral neck fracture; hypertension. Her vicodin and lidoderm patch has been stopped. She is unable to tell the difference in her pain level. There is no change in her appetite; no insomnia.    Past Medical History:  Diagnosis Date  . Arthritis   . Chronic back pain   . Closed wedge compression fracture of L1 vertebra ([place]) 2004  . Hypertension   . Sciatica     Past Surgical History:  Procedure Laterality Date  . ANTERIOR APPROACH HEMI HIP ARTHROPLASTY Left 06/29/2019   Procedure: Left ANTERIOR APPROACH HEMI HIP ARTHROPLASTY;  Surgeon: Leandrew Koyanagi, MD;  Location: [facility];  Service: Orthopedics;  Laterality: Left;  . APPENDECTOMY    . HIP PINNING,CANNULATED Right 03/18/2019   Procedure: CANNULATED HIP PINNING;  Surgeon: Carole Civil, MD;  Location: [facility];  Service: Orthopedics;  Laterality: Right;  11 am     Social History   Socioeconomic History  . Marital status: Married    Spouse name: Not on file  . Number of children: Not on file  . Years of education: Not on file  . Highest education level: Not on file  Occupational History  . Not on file  Social Needs  . Financial resource strain: Not on file  . Food insecurity    Worry: Not on file    Inability: Not on file  . Transportation needs    Medical: Not on file    Non-medical: Not on  file  Tobacco Use  . Smoking status: Never Smoker  . Smokeless tobacco: Never Used  Substance and Sexual Activity  . Alcohol use: Not on file  . Drug use: Not on file  . Sexual activity: Never  Lifestyle  . Physical activity    Days per week: Not on file    Minutes per session: Not on file  . Stress: Not on file  Relationships  . Social Herbalist on phone: Not on file    Gets together: Not on file    Attends religious service: Not on file    Active member of club or organization: Not on file    Attends meetings of clubs or organizations: Not on file    Relationship status: Not on file  . Intimate partner violence    Fear of current or ex partner: Not on file    Emotionally abused: Not on file    Physically abused: Not on file    Forced sexual activity: Not on file  Other Topics Concern  . Not on file  Social History Narrative   Daughter, Enid Derry, is HCPOA.  Patient is a nonsmoker, nondrinker.     Family History  Problem Relation Age of Onset  . Cancer Maternal Grandfather  VITAL SIGNS BP 133/78   Pulse 64   Temp (!) 96.8 F (36 C) (Oral)   Resp 17   Ht 5\' 11"  (1.803 m)   Wt 115 lb 12.8 oz (52.5 kg)   BMI 16.15 kg/m   Outpatient Encounter Medications as of 07/26/2019  Medication Sig  . acetaminophen (TYLENOL) 500 MG tablet Take 1,000 mg by mouth 3 (three) times daily.  . Amino Acids-Protein Hydrolys (FEEDING SUPPLEMENT, PRO-STAT SUGAR FREE 64,) LIQD ProStat 30 ml q hs to promote wound healing Once A Day 09:00 PM  . apixaban (ELIQUIS) 2.5 MG TABS tablet Take 2.5 mg by mouth 2 (two) times daily.   Roseanne Kaufman Peru-Castor Oil (VENELEX) OINT Venelex (balsam peru-castor oil) ointment; - ; topical  Special Instructions: Apply to coccyx, sacrum and right buttock qshift & prn for blanchable erythema and prevention. Every Shift Day, Evening, Night  . celecoxib (CELEBREX) 200 MG capsule Take 1 capsule (200 mg total) by mouth 2 (two) times daily.  .  collagenase (SANTYL) ointment Apply to right malleolar wound per tx order.  . digoxin (LANOXIN) 0.25 MG tablet Take 1 tablet (0.25 mg total) by mouth daily.  Marland Kitchen diltiazem (CARDIZEM CD) 240 MG 24 hr capsule Take 1 capsule (240 mg total) by mouth daily.  Mariane Baumgarten Sodium 100 MG capsule Take 100 mg by mouth 2 (two) times daily.  . feeding supplement, ENSURE ENLIVE, (ENSURE ENLIVE) LIQD Take 237 mLs by mouth 2 (two) times daily between meals.  Marland Kitchen loperamide (IMODIUM A-D) 2 MG tablet Take 2 mg by mouth 4 (four) times daily as needed for diarrhea or loose stools.  . lubiprostone (AMITIZA) 24 MCG capsule Take 24 mcg by mouth 2 (two) times daily.  . magnesium hydroxide (MILK OF MAGNESIA) 400 MG/5ML suspension Take 30 mLs by mouth 2 (two) times daily as needed for mild constipation.  . metoprolol tartrate (LOPRESSOR) 50 MG tablet Take 1 tablet (50 mg total) by mouth 2 (two) times daily.  . Multiple Vitamins-Minerals (MULTIVITAMIN WITH MINERALS) tablet Take 1 tablet by mouth daily.  . NON FORMULARY Diet Type: Regular,   NAS, Consistent Carbohydrate  . ondansetron (ZOFRAN-ODT) 4 MG disintegrating tablet Take 4 mg by mouth every 6 (six) hours as needed for nausea or vomiting.   . pantoprazole (PROTONIX) 20 MG tablet Take 1 tablet (20 mg total) by mouth daily.  Marland Kitchen tiZANidine (ZANAFLEX) 4 MG tablet Take 4 mg by mouth every 6 (six) hours as needed for muscle spasms.  Marland Kitchen UNABLE TO FIND Wound Care -   Clean wound right malleolar with N/S, apply santyl ointment and cover with allevyn foam dressing and change daily and as needed  . Vitamin D, Ergocalciferol, (DRISDOL) 50000 units CAPS capsule Take 50,000 Units by mouth See admin instructions. Twice a week on Tues, Saturday  . [DISCONTINUED] lidocaine (LIDODERM) 5 % Place 1 patch onto the skin daily. Remove & Discard patch within 12 hours or as directed by MD   No facility-administered encounter medications on file as of 07/26/2019.      SIGNIFICANT DIAGNOSTIC EXAMS     PREVIOUS:   03-17-19: right hip x-ray; impacted femoral neck fracture on the right   03-17-19: chest x-ray; Emphysematous changes without infiltrate.  06-28-19: lumbar spine x-ray: Changes of prior vertebral augmentation at T12. Interval fracture at T11 when compared with the prior CT  examination. This is of uncertain chronicity however.  06-28-19: left hip and pelvic x-ray: Acute, moderately displaced, foreshortened fracture of the left femoral neck  06-28-19: right hip and pelvic x-ray: Acute, moderately displaced, foreshortened fracture of the left femoral neck.  06-28-19: chest x-ray; No acute abnormality noted.   06-28-19: ct of head: 1. No evidence of acute intracranial abnormality. 2. Mild chronic small vessel ischemic disease.  06-29-19: pelvic x-ray: Interval left hip hemiarthroplasty without evidence of acute postoperative complication  [id number]: ct of lumbar spine:  1. No acute abnormality of the lumbar spine. 2. Chronic compression deformity of L1, unchanged. 3. Status post T12 vertebral augmentation. 4.  Aortic atherosclerosis  5. Small left pleural effusion. 6. Soft tissue gas within the left thighs likely postoperative.  07-07-19: kub: Nonobstructive bowel gas pattern with minimally prominent stool intransverse colon.  NO NEW EXAMS    LABS REVIEWED PREVIOUS;   03-17-19: wbc 5.1; hgb 12.0; hct 38.5; mcv 93.2 plt 189; glucose 116; bun 21; creat 0.83; k+3.8; na++ 139; ca 9.0  03-18-19: wbc 4.8; hgb 11.1; hct 34.7; mcv 90.8 ;plt 167; glucose 106; bun 17; creat 0.65; k+ 3.5; na++ 140; ca 8.6 03-20-19: wbc 6.0; hgb 11.7; hct 36.6; mcv 90.6; plt 177; glucose 120; bun 13; creat 0.61; k+ 3.7; na ++ 139; ca 8.6  03-21-19: wbc 4.8; hgb 11.0; hct 34.4; mcv 91.5 plt 172;  03-28-19: wbc 5.2; hgb 11.8 hct 35. 9 mcv 90.0 plt 341; glucose 104; bun 30; creat 0.92; k+ 4.1; na++ 136; ca 9.3  06-28-19: wbc 11.8; hgb 13.0; hct 41.3; mcv 92.0; plt 341; glucose 124; bun 59; creat 1.03 k+ 3.6; na++  139; ca 9.4; mag 2.3; tsh 1.230 06-30-19: wbc 11.8; hgb 11.4; hct 35.7; mcv 91.8; plt 318; glucose 147; bun  19; creat 0.61; k+ 4.3; na++ 140; ca 9.1 07-05-19: wbc 9.7; hgb 12.6; hct 39.6; mcv 91.9; plt 352; glucose 165; bun 16; creat 0.60; k+ 4.2; na++ 136; ca 9.1; mag 1.9 07-09-19: wbc 9.8; hgb 10.8; hct 34.5; mcv 93.2; plt 361  NO NEW LABS.    Review of Systems  Unable to perform ROS: Dementia (unable to participate)    Physical Exam Constitutional:      General: She is not in acute distress.    Appearance: She is well-developed. She is not diaphoretic.  Neck:     Musculoskeletal: Neck supple.     Thyroid: No thyromegaly.  Cardiovascular:     Rate and Rhythm: Normal rate. Rhythm irregular.     Pulses: Normal pulses.     Heart sounds: Normal heart sounds.  Pulmonary:     Effort: Pulmonary effort is normal. No respiratory distress.     Breath sounds: Normal breath sounds.  Chest:     Comments: History right mastectomy  Abdominal:     General: Bowel sounds are normal. There is no distension.     Palpations: Abdomen is soft.     Tenderness: There is no abdominal tenderness.  Musculoskeletal:     Right lower leg: No edema.     Left lower leg: No edema.     Comments: Is able to move all extremities 06-29-19: left hip hemiarthroplasty  03-18-19: right hip pinning         Lymphadenopathy:     Cervical: No cervical adenopathy.  Skin:    General: Skin is warm and dry.     Comments: Right medial ankle ulceration without signs of infection present Left hip incision without signs of infection present    Neurological:     Mental Status: She is alert. Mental status is at baseline.  Psychiatric:  Mood and Affect: Mood normal.      ASSESSMENT/ PLAN:  TODAY:   1. DDD (Degenerative disc disease) lumbar spine with without change: will continue celebrex 200 mg twice daily there is no difference in her pain level without her narcotics or lidoderm patch will monitor  2. Closed  displaced fracture of left femoral neck: is stable will follow up with orthopedics as indicated; will continue zanaflex 4 mg every 6 hours as needed and tylenol 1 gm three times daily   3. Benign essential HTN: is stable 133/78 will continue lopressor 50 mg twice daily    PREVIOUS   4. Atrial fibrillation with rapid ventricular response: is stable will continue digoxin 0.25 mg daily cardizem cd 240 mgm daily; lopressor 50 mg twice daily for rate control; is on long term eliquis 2.5 mg twice daily   5.  GERD without esophagitis: is stable will continue protonix 20 mg daily   6. Constipation due to opioid therapy: is stable will continue colace twice daily amitiza 24 mcg twice daily   7. Venous ulcer of right ankle: is without change: will continue current treatment and will monitor   8. Vit D deficiency is stable will continue vit D 50,000 units twice daily weekly   9. Malignant neoplasm of right breast stage 1 unspecified estrogen receptor unspecified: is stable is status post right mastectomy   10. Malnutrition of moderate degree: is without change will continue ensure twice daily   11. Vascular dementia without behavioral disturbance: weight is 121 pounds without significant change will monitor        MD is aware of resident's narcotic use and is in agreement with current plan of care. We will attempt to wean resident as appropriate.  Ok Edwards NP [facility]  Contact [phone] Monday through Friday 8am- 5pm  After hours call [phone]

## 2019-07-27 ENCOUNTER — Encounter: Payer: Self-pay | Admitting: Physician Assistant

## 2019-07-27 ENCOUNTER — Other Ambulatory Visit (HOSPITAL_COMMUNITY)
Admission: RE | Admit: 2019-07-27 | Discharge: 2019-07-27 | Disposition: A | Discharge status: Home / Self Care | Payer: Medicare Other | Source: Skilled Nursing Facility | Attending: Internal Medicine | Admitting: Internal Medicine

## 2019-07-27 DIAGNOSIS — Z20828 Contact with and (suspected) exposure to other viral communicable diseases: Secondary | ICD-10-CM | POA: Insufficient documentation

## 2019-07-28 ENCOUNTER — Non-Acute Institutional Stay (SKILLED_NURSING_FACILITY): Payer: Medicare Other | Admitting: Adult Health

## 2019-07-28 ENCOUNTER — Encounter: Payer: Self-pay | Admitting: Adult Health

## 2019-07-28 DIAGNOSIS — L97909 Non-pressure chronic ulcer of unspecified part of unspecified lower leg with unspecified severity: Secondary | ICD-10-CM | POA: Diagnosis not present

## 2019-07-28 DIAGNOSIS — I4891 Unspecified atrial fibrillation: Secondary | ICD-10-CM | POA: Diagnosis not present

## 2019-07-28 DIAGNOSIS — S72002A Fracture of unspecified part of neck of left femur, initial encounter for closed fracture: Secondary | ICD-10-CM | POA: Diagnosis not present

## 2019-07-28 NOTE — Progress Notes (Signed)
Location:    [facility] Room Number: 102/P Place of Service:  SNF (31)    CODE STATUS: DNR  Allergies  Allergen Reactions  . Cymbalta [Duloxetine Hcl]     " couldn't make water"- unable to urinate    Chief Complaint  Patient presents with  . Discharge Note    Discharge Visit    HPI:  She is being discharged to assisted living with home health for pt/ot/rn. She does not need any dme; her medications will be provided by the receiving facility. She will follow up medically at the receiving facility. She had been hospitalized for a left hip fracture nailing. She was admitted to this facility for short term rehab. She is back to her base line and is now ready to return to assisted living with home health.    Past Medical History:  Diagnosis Date  . Arthritis   . Chronic back pain   . Closed wedge compression fracture of L1 vertebra ([place]) 2004  . Hypertension   . Sciatica     Past Surgical History:  Procedure Laterality Date  . ANTERIOR APPROACH HEMI HIP ARTHROPLASTY Left 06/29/2019   Procedure: Left ANTERIOR APPROACH HEMI HIP ARTHROPLASTY;  Surgeon: Leandrew Koyanagi, MD;  Location: [facility];  Service: Orthopedics;  Laterality: Left;  . APPENDECTOMY    . HIP PINNING,CANNULATED Right 03/18/2019   Procedure: CANNULATED HIP PINNING;  Surgeon: Carole Civil, MD;  Location: [facility];  Service: Orthopedics;  Laterality: Right;  11 am     Social History   Socioeconomic History  . Marital status: Married    Spouse name: Not on file  . Number of children: Not on file  . Years of education: Not on file  . Highest education level: Not on file  Occupational History  . Not on file  Social Needs  . Financial resource strain: Not on file  . Food insecurity    Worry: Not on file    Inability: Not on file  . Transportation needs    Medical: Not on file    Non-medical: Not on file  Tobacco Use  . Smoking status: Never Smoker  . Smokeless tobacco: Never Used   Substance and Sexual Activity  . Alcohol use: Not on file  . Drug use: Not on file  . Sexual activity: Never  Lifestyle  . Physical activity    Days per week: Not on file    Minutes per session: Not on file  . Stress: Not on file  Relationships  . Social Herbalist on phone: Not on file    Gets together: Not on file    Attends religious service: Not on file    Active member of club or organization: Not on file    Attends meetings of clubs or organizations: Not on file    Relationship status: Not on file  . Intimate partner violence    Fear of current or ex partner: Not on file    Emotionally abused: Not on file    Physically abused: Not on file    Forced sexual activity: Not on file  Other Topics Concern  . Not on file  Social History Narrative   Daughter, Enid Derry, is HCPOA.  Patient is a nonsmoker, nondrinker.     Family History  Problem Relation Age of Onset  . Cancer Maternal Grandfather     VITAL SIGNS BP 133/78   Pulse 64   Temp (!) 96.7 F (35.9  C) (Oral)   Resp 17   Ht 5\' 11"  (1.803 m)   Wt 115 lb 12.8 oz (52.5 kg)   BMI 16.15 kg/m   Patient's Medications  New Prescriptions   No medications on file  Previous Medications   ACETAMINOPHEN (TYLENOL) 500 MG TABLET    Take 1,000 mg by mouth 3 (three) times daily.   AMINO ACIDS-PROTEIN HYDROLYS (FEEDING SUPPLEMENT, PRO-STAT SUGAR FREE 64,) LIQD    ProStat 30 ml q hs to promote wound healing Once A Day 09:00 PM   APIXABAN (ELIQUIS) 2.5 MG TABS TABLET    Take 2.5 mg by mouth 2 (two) times daily.    BALSAM PERU-CASTOR OIL (VENELEX) OINT    Venelex (balsam peru-castor oil) ointment; - ; topical  Special Instructions: Apply to coccyx, sacrum and right buttock qshift & prn for blanchable erythema and prevention. Every Shift Day, Evening, Night   CELECOXIB (CELEBREX) 200 MG CAPSULE    Take 1 capsule (200 mg total) by mouth 2 (two) times daily.   COLLAGENASE (SANTYL) OINTMENT    Apply to right malleolar  wound per tx order.   DIGOXIN (LANOXIN) 0.25 MG TABLET    Take 1 tablet (0.25 mg total) by mouth daily.   DILTIAZEM (CARDIZEM CD) 240 MG 24 HR CAPSULE    Take 1 capsule (240 mg total) by mouth daily.   DOCUSATE SODIUM 100 MG CAPSULE    Take 100 mg by mouth 2 (two) times daily.   FEEDING SUPPLEMENT, ENSURE ENLIVE, (ENSURE ENLIVE) LIQD    Take 237 mLs by mouth 2 (two) times daily between meals.   LOPERAMIDE (IMODIUM A-D) 2 MG TABLET    Take 2 mg by mouth 4 (four) times daily as needed for diarrhea or loose stools.   LUBIPROSTONE (AMITIZA) 24 MCG CAPSULE    Take 24 mcg by mouth 2 (two) times daily.   MAGNESIUM HYDROXIDE (MILK OF MAGNESIA) 400 MG/5ML SUSPENSION    Take 30 mLs by mouth 2 (two) times daily as needed for mild constipation.   METOPROLOL TARTRATE (LOPRESSOR) 50 MG TABLET    Take 1 tablet (50 mg total) by mouth 2 (two) times daily.   MULTIPLE VITAMINS-MINERALS (MULTIVITAMIN WITH MINERALS) TABLET    Take 1 tablet by mouth daily.   NON FORMULARY    Diet Type: Regular,   NAS, Consistent Carbohydrate   ONDANSETRON (ZOFRAN-ODT) 4 MG DISINTEGRATING TABLET    Take 4 mg by mouth every 6 (six) hours as needed for nausea or vomiting.    PANTOPRAZOLE (PROTONIX) 20 MG TABLET    Take 1 tablet (20 mg total) by mouth daily.   TIZANIDINE (ZANAFLEX) 4 MG TABLET    Take 4 mg by mouth every 6 (six) hours as needed for muscle spasms.   UNABLE TO FIND    Wound Care -   Clean wound right malleolar with N/S, apply santyl ointment and cover with allevyn foam dressing and change daily and as needed   VITAMIN D, ERGOCALCIFEROL, (DRISDOL) 50000 UNITS CAPS CAPSULE    Take 50,000 Units by mouth See admin instructions. Twice a week on Tues, Saturday  Modified Medications   No medications on file  Discontinued Medications   No medications on file     SIGNIFICANT DIAGNOSTIC EXAMS  PREVIOUS:   03-17-19: right hip x-ray; impacted femoral neck fracture on the right   03-17-19: chest x-ray; Emphysematous changes  without infiltrate.  06-28-19: lumbar spine x-ray: Changes of prior vertebral augmentation at T12. Interval fracture at T11  when compared with the prior CT  examination. This is of uncertain chronicity however.  06-28-19: left hip and pelvic x-ray: Acute, moderately displaced, foreshortened fracture of the left femoral neck  06-28-19: right hip and pelvic x-ray: Acute, moderately displaced, foreshortened fracture of the left femoral neck.  06-28-19: chest x-ray; No acute abnormality noted.   06-28-19: ct of head: 1. No evidence of acute intracranial abnormality. 2. Mild chronic small vessel ischemic disease.  06-29-19: pelvic x-ray: Interval left hip hemiarthroplasty without evidence of acute postoperative complication  [id number]: ct of lumbar spine:  1. No acute abnormality of the lumbar spine. 2. Chronic compression deformity of L1, unchanged. 3. Status post T12 vertebral augmentation. 4.  Aortic atherosclerosis  5. Small left pleural effusion. 6. Soft tissue gas within the left thighs likely postoperative.  07-07-19: kub: Nonobstructive bowel gas pattern with minimally prominent stool intransverse colon.  NO NEW EXAMS    LABS REVIEWED PREVIOUS;   03-17-19: wbc 5.1; hgb 12.0; hct 38.5; mcv 93.2 plt 189; glucose 116; bun 21; creat 0.83; k+3.8; na++ 139; ca 9.0  03-18-19: wbc 4.8; hgb 11.1; hct 34.7; mcv 90.8 ;plt 167; glucose 106; bun 17; creat 0.65; k+ 3.5; na++ 140; ca 8.6 03-20-19: wbc 6.0; hgb 11.7; hct 36.6; mcv 90.6; plt 177; glucose 120; bun 13; creat 0.61; k+ 3.7; na ++ 139; ca 8.6  03-21-19: wbc 4.8; hgb 11.0; hct 34.4; mcv 91.5 plt 172;  03-28-19: wbc 5.2; hgb 11.8 hct 35. 9 mcv 90.0 plt 341; glucose 104; bun 30; creat 0.92; k+ 4.1; na++ 136; ca 9.3  06-28-19: wbc 11.8; hgb 13.0; hct 41.3; mcv 92.0; plt 341; glucose 124; bun 59; creat 1.03 k+ 3.6; na++ 139; ca 9.4; mag 2.3; tsh 1.230 06-30-19: wbc 11.8; hgb 11.4; hct 35.7; mcv 91.8; plt 318; glucose 147; bun  19; creat 0.61; k+ 4.3; na++  140; ca 9.1 07-05-19: wbc 9.7; hgb 12.6; hct 39.6; mcv 91.9; plt 352; glucose 165; bun 16; creat 0.60; k+ 4.2; na++ 136; ca 9.1; mag 1.9 07-09-19: wbc 9.8; hgb 10.8; hct 34.5; mcv 93.2; plt 361  NO NEW LABS.    Review of Systems  Unable to perform ROS: Dementia (unable to participate )     Physical Exam Constitutional:      General: She is not in acute distress.    Appearance: She is well-developed. She is not diaphoretic.  Neck:     Musculoskeletal: Neck supple.     Thyroid: No thyromegaly.  Cardiovascular:     Rate and Rhythm: Normal rate. Rhythm irregular.     Pulses: Normal pulses.     Heart sounds: Normal heart sounds.  Pulmonary:     Effort: Pulmonary effort is normal. No respiratory distress.     Breath sounds: Normal breath sounds.  Chest:     Comments: History right mastectomy  Abdominal:     General: Bowel sounds are normal. There is no distension.     Palpations: Abdomen is soft.     Tenderness: There is no abdominal tenderness.  Musculoskeletal:     Right lower leg: No edema.     Left lower leg: No edema.     Comments: Is able to move all extremities 06-29-19: left hip hemiarthroplasty  03-18-19: right hip pinning          Lymphadenopathy:     Cervical: No cervical adenopathy.  Skin:    General: Skin is warm and dry.     Comments:  Right medial ankle ulceration without  signs of infection present Left hip incision without signs of infection present     Neurological:     Mental Status: She is alert. Mental status is at baseline.  Psychiatric:        Mood and Affect: Mood normal.       ASSESSMENT/ PLAN:   Patient is being discharged with the following home health services:  Pt/ot/rn to evaluate and treat as indicated for gait balance strength; adl training; wound care.   Patient is being discharged with the following durable medical equipment:  None needed   Patient has been advised to f/u with their PCP in 1-2 weeks to bring them up to date on their  rehab stay.  Social services at facility was responsible for arranging this appointment.  Pt was provided with a 30 day supply of prescriptions for medications and refills must be obtained from their PCP.  For controlled substances, a more limited supply may be provided adequate until PCP appointment only.   Her medications will be provided by the receiving facility.   Time spent with patient: 35 minutes: dme; medications and home health needs.    Ok Edwards NP [facility]  Contact [phone] Monday through Friday 8am- 5pm  After hours call [phone]

## 2019-07-29 ENCOUNTER — Other Ambulatory Visit: Payer: Self-pay | Admitting: Physician Assistant

## 2019-07-29 LAB — NOVEL CORONAVIRUS, NAA (HOSP ORDER, SEND-OUT TO REF LAB; TAT 18-24 HRS): SARS-CoV-2, NAA: NOT DETECTED

## 2019-08-15 ENCOUNTER — Telehealth: Payer: Self-pay

## 2019-08-15 ENCOUNTER — Other Ambulatory Visit: Payer: Self-pay | Admitting: Physician Assistant

## 2019-08-15 MED ORDER — CEPHALEXIN 250 MG PO CAPS: 250.0000 mg | ORAL_CAPSULE | Freq: Two times a day (BID) | ORAL | 0 refills | Status: DC

## 2019-08-15 NOTE — Telephone Encounter (Signed)
A prescription of Keflex 250 mg 1 twice daily has been sent to the pharmacy.

## 2019-08-15 NOTE — Telephone Encounter (Signed)
Does she frequently get cellulitis in her lower legs? I have only seen her once.

## 2019-08-15 NOTE — Telephone Encounter (Signed)
[name] from [facility] called and wanted to let provider know that patient is having left leg swelling and warm by touch. Daughter states the swelling started Saturday but [name] at [facility] was not told until today.  States patient is not complaing of any left leg pain. Please advise and send back to [facility].

## 2019-08-15 NOTE — Telephone Encounter (Signed)
Betty Carpenter states that patient does frequently get cellulitis in lower legs.

## 2019-08-15 NOTE — Telephone Encounter (Signed)
[name] aware and verbalizes understanding.

## 2019-08-29 ENCOUNTER — Telehealth: Payer: Self-pay

## 2019-08-29 DIAGNOSIS — G8929 Other chronic pain: Secondary | ICD-10-CM

## 2019-08-29 NOTE — Telephone Encounter (Signed)
Betty Carpenter from [facility] states that patients family would like to d/c salonpas patch due to cost and not working.  If approved will need d/c order sent to them.

## 2019-08-29 NOTE — Telephone Encounter (Signed)
Discontinue salonpas patch

## 2019-08-29 NOTE — Telephone Encounter (Signed)
Printed order to be faxed

## 2019-09-06 ENCOUNTER — Ambulatory Visit: Payer: Medicare Other | Admitting: Orthopaedic Surgery

## 2019-09-23 ENCOUNTER — Other Ambulatory Visit: Payer: Self-pay | Admitting: Family Medicine

## 2019-09-23 NOTE — Telephone Encounter (Signed)
Must be seen for refill

## 2019-09-26 ENCOUNTER — Other Ambulatory Visit: Payer: Self-pay | Admitting: *Deleted

## 2019-09-26 ENCOUNTER — Other Ambulatory Visit: Payer: Self-pay | Admitting: Physician Assistant

## 2019-09-26 MED ORDER — OXYCODONE HCL ER 15 MG PO T12A: 15.0000 mg | EXTENDED_RELEASE_TABLET | Freq: Two times a day (BID) | ORAL | 0 refills | Status: DC

## 2019-09-26 NOTE — Telephone Encounter (Signed)
I sent the prescription for OxyContin 15 mg 1 twice daily #14 so that would go for a week or 7.  I think we are having an appointment soon.  I have also sent in the note to that pharmacy that there information is not showing up on the PDMP report.  And that needs to be fixed

## 2019-09-26 NOTE — Telephone Encounter (Signed)
Patient has filled at [facility] this is where [facility] uses.

## 2019-09-26 NOTE — Telephone Encounter (Signed)
I am not able to see where this patient had anything filled on the PDMP report.  Can we find out who the pharmacy is?  And is exactly what is going on.  This should show up in the pharmacy report.

## 2019-09-26 NOTE — Telephone Encounter (Signed)
TC today from [place] w/ [facility] Also there is a Engineer, technical sales from [employer] Refill request for [employer] Oxycontin 20 mg tab 12h #60 1 Q12 hr She does not have a does for tonight  [name] at [facility] aware pt needs a visit / video visit in the next month

## 2019-09-28 ENCOUNTER — Ambulatory Visit (INDEPENDENT_AMBULATORY_CARE_PROVIDER_SITE_OTHER): Payer: Medicare Other | Admitting: Physician Assistant

## 2019-09-28 DIAGNOSIS — M5136 Other intervertebral disc degeneration, lumbar region: Secondary | ICD-10-CM

## 2019-09-28 DIAGNOSIS — S22000D Wedge compression fracture of unspecified thoracic vertebra, subsequent encounter for fracture with routine healing: Secondary | ICD-10-CM | POA: Diagnosis not present

## 2019-09-28 DIAGNOSIS — M4306 Spondylolysis, lumbar region: Secondary | ICD-10-CM | POA: Diagnosis not present

## 2019-09-28 MED ORDER — OXYCODONE HCL ER 15 MG PO T12A: 15.0000 mg | EXTENDED_RELEASE_TABLET | Freq: Two times a day (BID) | ORAL | 0 refills | Status: DC

## 2019-09-30 ENCOUNTER — Ambulatory Visit (INDEPENDENT_AMBULATORY_CARE_PROVIDER_SITE_OTHER): Payer: Medicare Other

## 2019-09-30 ENCOUNTER — Other Ambulatory Visit: Payer: Self-pay

## 2019-09-30 DIAGNOSIS — L97319 Non-pressure chronic ulcer of right ankle with unspecified severity: Secondary | ICD-10-CM | POA: Diagnosis not present

## 2019-09-30 DIAGNOSIS — Z9181 History of falling: Secondary | ICD-10-CM

## 2019-09-30 DIAGNOSIS — Z4800 Encounter for change or removal of nonsurgical wound dressing: Secondary | ICD-10-CM

## 2019-09-30 DIAGNOSIS — M48061 Spinal stenosis, lumbar region without neurogenic claudication: Secondary | ICD-10-CM

## 2019-09-30 DIAGNOSIS — F0390 Unspecified dementia without behavioral disturbance: Secondary | ICD-10-CM

## 2019-09-30 DIAGNOSIS — I872 Venous insufficiency (chronic) (peripheral): Secondary | ICD-10-CM | POA: Diagnosis not present

## 2019-09-30 DIAGNOSIS — Z79891 Long term (current) use of opiate analgesic: Secondary | ICD-10-CM

## 2019-09-30 DIAGNOSIS — G8929 Other chronic pain: Secondary | ICD-10-CM

## 2019-09-30 DIAGNOSIS — M5136 Other intervertebral disc degeneration, lumbar region: Secondary | ICD-10-CM

## 2019-09-30 DIAGNOSIS — E440 Moderate protein-calorie malnutrition: Secondary | ICD-10-CM

## 2019-09-30 DIAGNOSIS — Z86718 Personal history of other venous thrombosis and embolism: Secondary | ICD-10-CM

## 2019-09-30 DIAGNOSIS — Z8781 Personal history of (healed) traumatic fracture: Secondary | ICD-10-CM

## 2019-09-30 DIAGNOSIS — I10 Essential (primary) hypertension: Secondary | ICD-10-CM

## 2019-09-30 DIAGNOSIS — Z7901 Long term (current) use of anticoagulants: Secondary | ICD-10-CM

## 2019-09-30 DIAGNOSIS — I4891 Unspecified atrial fibrillation: Secondary | ICD-10-CM

## 2019-09-30 DIAGNOSIS — Z96642 Presence of left artificial hip joint: Secondary | ICD-10-CM

## 2019-09-30 DIAGNOSIS — T402X5D Adverse effect of other opioids, subsequent encounter: Secondary | ICD-10-CM

## 2019-09-30 DIAGNOSIS — K5903 Drug induced constipation: Secondary | ICD-10-CM

## 2019-09-30 DIAGNOSIS — Z9011 Acquired absence of right breast and nipple: Secondary | ICD-10-CM

## 2019-10-02 ENCOUNTER — Encounter: Payer: Self-pay | Admitting: Physician Assistant

## 2019-10-02 NOTE — Progress Notes (Signed)
Telephone visit  Subjective: [id number] medications PCP: Terald Sleeper, PA-C [id number] Betty Carpenter is a 85 y.o. female calls for telephone consult today. Patient provides verbal consent for consult held via phone.  Patient is identified with 2 separate identifiers.  At this time the entire area is on COVID-19 social distancing and stay home orders are in place.  Patient is of higher risk and therefore we are performing this by a virtual method.  Location of patient: [facility] Location of provider: [facility] Others present for call: caretaker   This patient is a follow-up for her chronic pain medication.  She is a resident at [facility].  She was previously a patient with Dr. Edrick Oh.  He had had her on 20 mg OxyContin twice daily.  We are going to start tapering her off.  At this time working to do 15 mg twice daily and have her come in in 1 month.  Working to continue to work this down.  She does have known degenerative disc disease, spondylosis, compression fractures.  She was present on the phone with the nurse caregiver from the home.  PAIN ASSESSMENT: Cause of pain- DDD, lumbar spondylosis History of compression fracture and hip fracture  This patient returns for a 3 month recheck on narcotic use for the above named conditions  Current medications-OxyContin 20 mg 1 twice daily Medication side effects-none Any concerns-no  Pain on scale of 1-10-8, in a lot of pain most of th etime Frequency-Daily What increases pain-walking What makes pain Better-rest Effects on ADL -no Any change in general medical condition-no  Effectiveness of current meds-good Adverse reactions form pain meds-no PMP AWARE website reviewed: Yes Any suspicious activity on PMP Aware: No   The participating pharmacy for [facility] is [facility] [place], [place]. Not showing up on PDMP, have placed request in the refill can called for faxed report of the history   MME daily dose: 60  MME, this month 45 MME   Recheck 1 month plan oxycontin 15 mg BID as next goal  Contract on file 06/24/19 Last UDS  06/24/19 History of overdose or risk of abuse no    ROS: Per HPI  Allergies  Allergen Reactions  . Cymbalta [Duloxetine Hcl]     " couldn't make water"- unable to urinate   Past Medical History:  Diagnosis Date  . Arthritis   . Chronic back pain   . Closed wedge compression fracture of L1 vertebra ([place]) 2004  . Hypertension   . Sciatica     Current Outpatient Medications:  .  acetaminophen (TYLENOL) 500 MG tablet, Take 1,000 mg by mouth 3 (three) times daily., Disp: , Rfl:  .  Amino Acids-Protein Hydrolys (FEEDING SUPPLEMENT, PRO-STAT SUGAR FREE 64,) LIQD, ProStat 30 ml q hs to promote wound healing Once A Day 09:00 PM, Disp: , Rfl:  .  apixaban (ELIQUIS) 2.5 MG TABS tablet, Take 2.5 mg by mouth 2 (two) times daily. , Disp: , Rfl:  .  Balsam [place]-Castor Oil (VENELEX) OINT, Venelex (balsam peru-castor oil) ointment; - ; topical  Special Instructions: Apply to coccyx, sacrum and right buttock qshift & prn for blanchable erythema and prevention. Every Shift Day, Evening, Night, Disp: , Rfl:  .  celecoxib (CELEBREX) 200 MG capsule, Take 1 capsule (200 mg total) by mouth 2 (two) times daily., Disp: , Rfl:  .  cephALEXin (KEFLEX) 250 MG capsule, Take 1 capsule (250 mg total) by mouth 2 (two) times daily., Disp: 14  capsule, Rfl: 0 .  collagenase (SANTYL) ointment, Apply to right malleolar wound per tx order., Disp: , Rfl:  .  digoxin (LANOXIN) 0.25 MG tablet, Take 1 tablet (0.25 mg total) by mouth daily., Disp:  , Rfl:  .  diltiazem (CARDIZEM CD) 240 MG 24 hr capsule, Take 1 capsule (240 mg total) by mouth daily., Disp:  , Rfl:  .  Docusate Sodium 100 MG capsule, Take 100 mg by mouth 2 (two) times daily., Disp: , Rfl:  .  feeding supplement, ENSURE ENLIVE, (ENSURE ENLIVE) LIQD, Take 237 mLs by mouth 2 (two) times daily between meals., Disp: , Rfl:  .  loperamide (IMODIUM A-D)  2 MG tablet, Take 2 mg by mouth 4 (four) times daily as needed for diarrhea or loose stools., Disp: , Rfl:  .  lubiprostone (AMITIZA) 24 MCG capsule, Take 24 mcg by mouth 2 (two) times daily., Disp: , Rfl:  .  magnesium hydroxide (MILK OF MAGNESIA) 400 MG/5ML suspension, Take 30 mLs by mouth 2 (two) times daily as needed for mild constipation., Disp: , Rfl:  .  metoprolol tartrate (LOPRESSOR) 50 MG tablet, Take 1 tablet (50 mg total) by mouth 2 (two) times daily., Disp:  , Rfl:  .  Multiple Vitamins-Minerals (MULTIVITAMIN WITH MINERALS) tablet, Take 1 tablet by mouth daily., Disp: , Rfl:  .  NON FORMULARY, Diet Type: Regular,   NAS, Consistent Carbohydrate, Disp: , Rfl:  .  ondansetron (ZOFRAN-ODT) 4 MG disintegrating tablet, Take 4 mg by mouth every 6 (six) hours as needed for nausea or vomiting. , Disp: , Rfl:  .  oxyCODONE (OXYCONTIN) 15 mg 12 hr tablet, Take 1 tablet (15 mg total) by mouth every 12 (twelve) hours., Disp: 60 tablet, Rfl: 0 .  pantoprazole (PROTONIX) 20 MG tablet, Take 1 tablet (20 mg total) by mouth daily., Disp: , Rfl:  .  tiZANidine (ZANAFLEX) 4 MG tablet, Take 4 mg by mouth every 6 (six) hours as needed for muscle spasms., Disp: , Rfl:  .  UNABLE TO FIND, Wound Care -   Clean wound right malleolar with N/S, apply santyl ointment and cover with allevyn foam dressing and change daily and as needed, Disp: , Rfl:  .  Vitamin D, Ergocalciferol, (DRISDOL) 50000 units CAPS capsule, Take 50,000 Units by mouth See admin instructions. Twice a week on Tues, Saturday, Disp: , Rfl:   Assessment/ Plan: 85 y.o. female   1. Closed compression fracture of thoracic vertebra with routine healing, subsequent encounter - oxyCODONE (OXYCONTIN) 15 mg 12 hr tablet; Take 1 tablet (15 mg total) by mouth every 12 (twelve) hours.  Dispense: 60 tablet; Refill: 0  2. DDD (degenerative disc disease), lumbar - oxyCODONE (OXYCONTIN) 15 mg 12 hr tablet; Take 1 tablet (15 mg total) by mouth every 12 (twelve)  hours.  Dispense: 60 tablet; Refill: 0  3. Lumbar spondylolysis - oxyCODONE (OXYCONTIN) 15 mg 12 hr tablet; Take 1 tablet (15 mg total) by mouth every 12 (twelve) hours.  Dispense: 60 tablet; Refill: 0   No follow-ups on file.  Continue all other maintenance medications as listed above.  Start time: 10:05 AM End time: 10:16 AM  Meds ordered this encounter  Medications  . oxyCODONE (OXYCONTIN) 15 mg 12 hr tablet    Sig: Take 1 tablet (15 mg total) by mouth every 12 (twelve) hours.    Dispense:  60 tablet    Refill:  0    The prescriptions you have performed her are not showing up in  the PDMP report.    Order Specific Question:   Supervising Provider    Answer:   Janora Norlander [id number]    Particia Nearing PA-C [place] [phone]

## 2019-10-17 ENCOUNTER — Emergency Department (HOSPITAL_COMMUNITY): Payer: Medicare Other

## 2019-10-17 ENCOUNTER — Encounter (HOSPITAL_COMMUNITY): Payer: Self-pay | Admitting: Emergency Medicine

## 2019-10-17 ENCOUNTER — Inpatient Hospital Stay (HOSPITAL_COMMUNITY)
Admission: EM | Admit: 2019-10-17 | Discharge: 2019-10-20 | DRG: 308 | Disposition: A | Discharge status: Hospice | Payer: Medicare Other | Source: Skilled Nursing Facility | Attending: Internal Medicine | Admitting: Internal Medicine

## 2019-10-17 ENCOUNTER — Inpatient Hospital Stay (HOSPITAL_COMMUNITY): Payer: Medicare Other

## 2019-10-17 ENCOUNTER — Other Ambulatory Visit: Payer: Self-pay

## 2019-10-17 DIAGNOSIS — Z20828 Contact with and (suspected) exposure to other viral communicable diseases: Secondary | ICD-10-CM | POA: Diagnosis present

## 2019-10-17 DIAGNOSIS — T460X5A Adverse effect of cardiac-stimulant glycosides and drugs of similar action, initial encounter: Secondary | ICD-10-CM | POA: Diagnosis present

## 2019-10-17 DIAGNOSIS — F0390 Unspecified dementia without behavioral disturbance: Secondary | ICD-10-CM | POA: Diagnosis present

## 2019-10-17 DIAGNOSIS — T460X1A Poisoning by cardiac-stimulant glycosides and drugs of similar action, accidental (unintentional), initial encounter: Secondary | ICD-10-CM | POA: Diagnosis not present

## 2019-10-17 DIAGNOSIS — I10 Essential (primary) hypertension: Secondary | ICD-10-CM | POA: Diagnosis present

## 2019-10-17 DIAGNOSIS — Z66 Do not resuscitate: Secondary | ICD-10-CM | POA: Diagnosis present

## 2019-10-17 DIAGNOSIS — Z853 Personal history of malignant neoplasm of breast: Secondary | ICD-10-CM | POA: Diagnosis not present

## 2019-10-17 DIAGNOSIS — Y92129 Unspecified place in nursing home as the place of occurrence of the external cause: Secondary | ICD-10-CM | POA: Diagnosis not present

## 2019-10-17 DIAGNOSIS — R680 Hypothermia, not associated with low environmental temperature: Secondary | ICD-10-CM | POA: Diagnosis present

## 2019-10-17 DIAGNOSIS — M1990 Unspecified osteoarthritis, unspecified site: Secondary | ICD-10-CM | POA: Diagnosis present

## 2019-10-17 DIAGNOSIS — E872 Acidosis: Secondary | ICD-10-CM | POA: Diagnosis present

## 2019-10-17 DIAGNOSIS — N179 Acute kidney failure, unspecified: Secondary | ICD-10-CM | POA: Diagnosis present

## 2019-10-17 DIAGNOSIS — Z9011 Acquired absence of right breast and nipple: Secondary | ICD-10-CM

## 2019-10-17 DIAGNOSIS — Z79891 Long term (current) use of opiate analgesic: Secondary | ICD-10-CM

## 2019-10-17 DIAGNOSIS — Z96642 Presence of left artificial hip joint: Secondary | ICD-10-CM | POA: Diagnosis present

## 2019-10-17 DIAGNOSIS — I959 Hypotension, unspecified: Secondary | ICD-10-CM | POA: Diagnosis present

## 2019-10-17 DIAGNOSIS — G92 Toxic encephalopathy: Secondary | ICD-10-CM | POA: Diagnosis present

## 2019-10-17 DIAGNOSIS — I4821 Permanent atrial fibrillation: Secondary | ICD-10-CM | POA: Diagnosis not present

## 2019-10-17 DIAGNOSIS — I248 Other forms of acute ischemic heart disease: Secondary | ICD-10-CM | POA: Diagnosis present

## 2019-10-17 DIAGNOSIS — Z515 Encounter for palliative care: Secondary | ICD-10-CM | POA: Diagnosis not present

## 2019-10-17 DIAGNOSIS — M549 Dorsalgia, unspecified: Secondary | ICD-10-CM | POA: Diagnosis not present

## 2019-10-17 DIAGNOSIS — R6510 Systemic inflammatory response syndrome (SIRS) of non-infectious origin without acute organ dysfunction: Secondary | ICD-10-CM | POA: Diagnosis not present

## 2019-10-17 DIAGNOSIS — E162 Hypoglycemia, unspecified: Secondary | ICD-10-CM | POA: Diagnosis present

## 2019-10-17 DIAGNOSIS — Z86718 Personal history of other venous thrombosis and embolism: Secondary | ICD-10-CM

## 2019-10-17 DIAGNOSIS — F0391 Unspecified dementia with behavioral disturbance: Secondary | ICD-10-CM | POA: Diagnosis not present

## 2019-10-17 DIAGNOSIS — I4819 Other persistent atrial fibrillation: Secondary | ICD-10-CM | POA: Diagnosis present

## 2019-10-17 DIAGNOSIS — Z79899 Other long term (current) drug therapy: Secondary | ICD-10-CM | POA: Diagnosis not present

## 2019-10-17 DIAGNOSIS — M4306 Spondylolysis, lumbar region: Secondary | ICD-10-CM

## 2019-10-17 DIAGNOSIS — G8929 Other chronic pain: Secondary | ICD-10-CM | POA: Diagnosis present

## 2019-10-17 DIAGNOSIS — S22000D Wedge compression fracture of unspecified thoracic vertebra, subsequent encounter for fracture with routine healing: Secondary | ICD-10-CM

## 2019-10-17 DIAGNOSIS — M5136 Other intervertebral disc degeneration, lumbar region: Secondary | ICD-10-CM

## 2019-10-17 DIAGNOSIS — Z7901 Long term (current) use of anticoagulants: Secondary | ICD-10-CM | POA: Diagnosis not present

## 2019-10-17 DIAGNOSIS — R001 Bradycardia, unspecified: Secondary | ICD-10-CM | POA: Diagnosis present

## 2019-10-17 DIAGNOSIS — I482 Chronic atrial fibrillation, unspecified: Secondary | ICD-10-CM | POA: Diagnosis not present

## 2019-10-17 DIAGNOSIS — E876 Hypokalemia: Secondary | ICD-10-CM | POA: Diagnosis present

## 2019-10-17 DIAGNOSIS — K219 Gastro-esophageal reflux disease without esophagitis: Secondary | ICD-10-CM | POA: Diagnosis present

## 2019-10-17 DIAGNOSIS — Z7189 Other specified counseling: Secondary | ICD-10-CM

## 2019-10-17 DIAGNOSIS — Z993 Dependence on wheelchair: Secondary | ICD-10-CM

## 2019-10-17 HISTORY — DX: Unspecified atrial fibrillation: I48.91

## 2019-10-17 LAB — URINALYSIS, ROUTINE W REFLEX MICROSCOPIC
Bilirubin Urine: NEGATIVE
Glucose, UA: NEGATIVE mg/dL
Ketones, ur: NEGATIVE mg/dL
Nitrite: NEGATIVE
Protein, ur: 100 mg/dL — AB
RBC / HPF: >50 RBC/hpf — ABNORMAL HIGH (ref 0–5)
Specific Gravity, Urine: 1.029 (ref 1.005–1.030)
WBC, UA: >50 WBC/hpf — ABNORMAL HIGH (ref 0–5)
pH: 5 (ref 5.0–8.0)

## 2019-10-17 LAB — CBC WITH DIFFERENTIAL/PLATELET
Abs Immature Granulocytes: 0.1 10*3/uL — ABNORMAL HIGH (ref 0.00–0.07)
Basophils Absolute: 0 10*3/uL (ref 0.0–0.1)
Basophils Relative: 0 %
Eosinophils Absolute: 0 10*3/uL (ref 0.0–0.5)
Eosinophils Relative: 0 %
HCT: 38.8 % (ref 36.0–46.0)
Hemoglobin: 12.2 g/dL (ref 12.0–15.0)
Immature Granulocytes: 1 %
Lymphocytes Relative: 7 %
Lymphs Abs: 0.7 10*3/uL (ref 0.7–4.0)
MCH: 28.6 pg (ref 26.0–34.0)
MCHC: 31.4 g/dL (ref 30.0–36.0)
MCV: 90.9 fL (ref 80.0–100.0)
Monocytes Absolute: 0.9 10*3/uL (ref 0.1–1.0)
Monocytes Relative: 9 %
Neutro Abs: 9 10*3/uL — ABNORMAL HIGH (ref 1.7–7.7)
Neutrophils Relative %: 83 %
Platelets: 347 10*3/uL (ref 150–400)
RBC: 4.27 MIL/uL (ref 3.87–5.11)
RDW: 17.4 % — ABNORMAL HIGH (ref 11.5–15.5)
WBC: 10.7 10*3/uL — ABNORMAL HIGH (ref 4.0–10.5)
nRBC: 0 % (ref 0.0–0.2)

## 2019-10-17 LAB — COMPREHENSIVE METABOLIC PANEL
ALT: 25 U/L (ref 0–44)
AST: 71 U/L — ABNORMAL HIGH (ref 15–41)
Albumin: 3.4 g/dL — ABNORMAL LOW (ref 3.5–5.0)
Alkaline Phosphatase: 295 U/L — ABNORMAL HIGH (ref 38–126)
Anion gap: 14 (ref 5–15)
BUN: 26 mg/dL — ABNORMAL HIGH (ref 8–23)
CO2: 23 mmol/L (ref 22–32)
Calcium: 8.4 mg/dL — ABNORMAL LOW (ref 8.9–10.3)
Chloride: 100 mmol/L (ref 98–111)
Creatinine, Ser: 1.42 mg/dL — ABNORMAL HIGH (ref 0.44–1.00)
GFR calc Af Amer: 37 mL/min — ABNORMAL LOW (ref >60)
GFR calc non Af Amer: 32 mL/min — ABNORMAL LOW (ref >60)
Glucose, Bld: 54 mg/dL — ABNORMAL LOW (ref 70–99)
Potassium: 3.6 mmol/L (ref 3.5–5.1)
Sodium: 137 mmol/L (ref 135–145)
Total Bilirubin: 0.7 mg/dL (ref 0.3–1.2)
Total Protein: 6.7 g/dL (ref 6.5–8.1)

## 2019-10-17 LAB — PROTIME-INR
INR: 1.5 — ABNORMAL HIGH (ref 0.8–1.2)
Prothrombin Time: 17.8 seconds — ABNORMAL HIGH (ref 11.4–15.2)

## 2019-10-17 LAB — CBG MONITORING, ED
Glucose-Capillary: 121 mg/dL — ABNORMAL HIGH (ref 70–99)
Glucose-Capillary: 51 mg/dL — ABNORMAL LOW (ref 70–99)

## 2019-10-17 LAB — LACTIC ACID, PLASMA
Lactic Acid, Venous: 2.6 mmol/L (ref 0.5–1.9)
Lactic Acid, Venous: 3 mmol/L (ref 0.5–1.9)
Lactic Acid, Venous: 2.2 mmol/L (ref 0.5–1.9)

## 2019-10-17 LAB — SARS CORONAVIRUS 2 BY RT PCR (HOSPITAL ORDER, PERFORMED IN ~~LOC~~ HOSPITAL LAB): SARS Coronavirus 2: NEGATIVE

## 2019-10-17 LAB — TROPONIN I (HIGH SENSITIVITY)
Troponin I (High Sensitivity): 154 ng/L (ref <18)
Troponin I (High Sensitivity): 136 ng/L (ref <18)

## 2019-10-17 LAB — APTT: aPTT: 44 seconds — ABNORMAL HIGH (ref 24–36)

## 2019-10-17 LAB — TSH: TSH: 3.127 u[IU]/mL (ref 0.350–4.500)

## 2019-10-17 LAB — MAGNESIUM: Magnesium: 2.1 mg/dL (ref 1.7–2.4)

## 2019-10-17 LAB — DIGOXIN LEVEL: Digoxin Level: 2.8 ng/mL (ref 0.8–2.0)

## 2019-10-17 MED ORDER — STERILE WATER FOR INJECTION IJ SOLN
INTRAMUSCULAR | Status: AC
  Administered 2019-10-17: 18:00:00
  Filled 2019-10-17: qty 10

## 2019-10-17 MED ORDER — DEXTROSE 50 % IV SOLN
12.5000 g | INTRAVENOUS | Status: AC
  Administered 2019-10-17: 12.5 g via INTRAVENOUS

## 2019-10-17 MED ORDER — SODIUM CHLORIDE 0.9 % IV SOLN: 2.0000 g | INTRAVENOUS | Status: DC

## 2019-10-17 MED ORDER — ACETAMINOPHEN 650 MG RE SUPP: 650.0000 mg | Freq: Four times a day (QID) | RECTAL | Status: DC | PRN

## 2019-10-17 MED ORDER — METRONIDAZOLE IN NACL 5-0.79 MG/ML-% IV SOLN
500.0000 mg | Freq: Three times a day (TID) | INTRAVENOUS | Status: DC
  Administered 2019-10-17 – 2019-10-18 (×3): 500 mg via INTRAVENOUS
  Filled 2019-10-17 (×3): qty 100

## 2019-10-17 MED ORDER — SODIUM CHLORIDE 0.9 % IV SOLN: 6.0000 | Freq: Once | INTRAVENOUS | Status: DC

## 2019-10-17 MED ORDER — EPINEPHRINE HCL 5 MG/250ML IV SOLN IN NS
2.0000 ug/min | INTRAVENOUS | Status: DC
  Filled 2019-10-17: qty 250

## 2019-10-17 MED ORDER — SODIUM CHLORIDE 0.9 % IV BOLUS
500.0000 mL | Freq: Once | INTRAVENOUS | Status: AC
  Administered 2019-10-17: 500 mL via INTRAVENOUS

## 2019-10-17 MED ORDER — LACTATED RINGERS IV BOLUS
500.0000 mL | Freq: Once | INTRAVENOUS | Status: AC
  Administered 2019-10-17: 500 mL via INTRAVENOUS

## 2019-10-17 MED ORDER — ZIPRASIDONE MESYLATE 20 MG IM SOLR
10.0000 mg | Freq: Once | INTRAMUSCULAR | Status: AC
  Administered 2019-10-17: 10 mg via INTRAMUSCULAR
  Filled 2019-10-17: qty 20

## 2019-10-17 MED ORDER — EPINEPHRINE PF 1 MG/ML IJ SOLN
0.5000 ug/min | INTRAVENOUS | Status: DC
  Administered 2019-10-17: 9.013 ug/min via INTRAVENOUS
  Administered 2019-10-17: 0.5 ug/min via INTRAVENOUS
  Filled 2019-10-17: qty 4

## 2019-10-17 MED ORDER — SODIUM CHLORIDE 0.9 % IV SOLN
2.0000 g | Freq: Once | INTRAVENOUS | Status: AC
  Administered 2019-10-18: 2 g via INTRAVENOUS
  Filled 2019-10-17: qty 2

## 2019-10-17 MED ORDER — LACTATED RINGERS IV SOLN
INTRAVENOUS | Status: DC
  Administered 2019-10-17: 19:00:00 via INTRAVENOUS

## 2019-10-17 MED ORDER — DEXTROSE 50 % IV SOLN
INTRAVENOUS | Status: AC
  Filled 2019-10-17: qty 50

## 2019-10-17 MED ORDER — VANCOMYCIN HCL IN DEXTROSE 750-5 MG/150ML-% IV SOLN: 750.0000 mg | INTRAVENOUS | Status: DC

## 2019-10-17 MED ORDER — SODIUM CHLORIDE 0.9 % IV SOLN
3.0000 | Freq: Once | INTRAVENOUS | Status: AC
  Administered 2019-10-17: 3 via INTRAVENOUS
  Filled 2019-10-17: qty 120

## 2019-10-17 MED ORDER — ATROPINE SULFATE 1 MG/10ML IJ SOSY: 0.5000 mg | PREFILLED_SYRINGE | Freq: Once | INTRAMUSCULAR | Status: DC

## 2019-10-17 MED ORDER — ACETAMINOPHEN 325 MG PO TABS: 650.0000 mg | ORAL_TABLET | Freq: Four times a day (QID) | ORAL | Status: DC | PRN

## 2019-10-17 MED ORDER — ATROPINE SULFATE 1 MG/10ML IJ SOSY
0.5000 mg | PREFILLED_SYRINGE | INTRAMUSCULAR | Status: DC | PRN
  Administered 2019-10-17 (×3): 0.5 mg via INTRAVENOUS
  Filled 2019-10-17 (×3): qty 10

## 2019-10-17 MED ORDER — KCL IN DEXTROSE-NACL 20-5-0.9 MEQ/L-%-% IV SOLN
INTRAVENOUS | Status: AC
  Administered 2019-10-17 – 2019-10-18 (×2): via INTRAVENOUS

## 2019-10-17 MED ORDER — ONDANSETRON HCL 4 MG/2ML IJ SOLN: 4.0000 mg | Freq: Four times a day (QID) | INTRAMUSCULAR | Status: DC | PRN

## 2019-10-17 MED ORDER — VANCOMYCIN HCL IN DEXTROSE 1-5 GM/200ML-% IV SOLN
1000.0000 mg | Freq: Once | INTRAVENOUS | Status: AC
  Administered 2019-10-17: 1000 mg via INTRAVENOUS
  Filled 2019-10-17: qty 200

## 2019-10-17 MED ORDER — SODIUM CHLORIDE 0.9 % IV SOLN: INTRAVENOUS | Status: DC

## 2019-10-17 MED ORDER — ONDANSETRON HCL 4 MG PO TABS: 4.0000 mg | ORAL_TABLET | Freq: Four times a day (QID) | ORAL | Status: DC | PRN

## 2019-10-17 MED ORDER — SODIUM CHLORIDE 0.9 % IV BOLUS
500.0000 mL | Freq: Once | INTRAVENOUS | Status: AC
  Administered 2019-10-17: 16:00:00 500 mL via INTRAVENOUS

## 2019-10-17 MED ORDER — ENOXAPARIN SODIUM 30 MG/0.3ML ~~LOC~~ SOLN
30.0000 mg | SUBCUTANEOUS | Status: DC
  Administered 2019-10-18 (×2): 30 mg via SUBCUTANEOUS
  Filled 2019-10-17: qty 0.3

## 2019-10-17 NOTE — ED Notes (Signed)
Family at bedside. 

## 2019-10-17 NOTE — ED Notes (Signed)
CRITICAL VALUE ALERT  Critical Value:  Lactic acid 2.6  Date & Time Notied:  10/17/2019, 1718  Provider Notified: Dr. Kathrynn Humble  Orders Received/Actions taken: no new orders

## 2019-10-17 NOTE — ED Notes (Signed)
Date and time results received: 10/17/19 3:49 PM (use smartphrase ".now" to insert current time)  Test: Digoxin Critical Value: 2.8  Name of Provider Notified: nanavati  Orders Received? Or Actions Taken?: Orders Received - See Orders for details

## 2019-10-17 NOTE — ED Notes (Signed)
Date and time results received: 10/17/19 2029 (use smartphrase ".now" to insert current time)  Test: troponin Critical Value: 154  Name of Provider Notified: Dr. Denton Brick  Orders Received? Or Actions Taken?: no/na

## 2019-10-17 NOTE — Progress Notes (Signed)
Pharmacy Antibiotic Note  CERI HEIDTKE is a 85 y.o. female admitted on 10/17/2019 with bradycardia and confusion.  Pharmacy has been consulted for vancomycin and cefepime dosing for sepsis.  Plan: Cefepime 2g IV q24h Vancomycin 1g IV x 1, then 750mg  IV q48h for estimated AUC 463 (goal 400-550) Flagyl 500mg  IV q8h per MD Follow up renal function & cultures, vancomycin levels as needed  Weight: 115 lb (52.2 kg)  Temp (24hrs), Avg:98.4 F (36.9 C), Min:95.5 F (35.3 C), Max:98.8 F (37.1 C)  Recent Labs  Lab 10/17/19 1502 10/17/19 1544 10/17/19 1744  WBC 10.7*  --   --   CREATININE 1.42*  --   --   LATICACIDVEN  --  2.6* 3.0*    Estimated Creatinine Clearance: 20.8 mL/min (A) (by C-G formula based on SCr of 1.42 mg/dL (H)).    Allergies  Allergen Reactions  . Cymbalta [Duloxetine Hcl]     " couldn't make water"- unable to urinate    Antimicrobials this admission: 11/30 Vancomycin >> 11/30 Cefepime >> 11/30 Flagyl >>  Dose adjustments this admission:   Microbiology results: 11/30 SARS CoV-2: neg 11/30 BCx: 11/30 UCx:  Thank you for allowing pharmacy to be a part of this patient's care.  Peggyann Juba, PharmD, BCPS 10/17/2019 8:12 PM

## 2019-10-17 NOTE — ED Provider Notes (Signed)
[facility] EMERGENCY DEPARTMENT Provider Note   CSN: [id number] Arrival date & time: 10/17/19  1436     History   Chief Complaint Chief Complaint  Patient presents with  . Bradycardia    HPI Betty Carpenter is a 85 y.o. female.     HPI Level 5 caveat for severe dementia.  85 year old female comes in a chief complaint of low heart rate.  Patient has history of A. fib and is on digoxin.  Patient has no complaints but her daughter informs [place] that she is appearing more confused than usual and talking out of her mind.  Patient is noted to be bradycardic, hypothermic and hypotensive.  Family denies any new medication changes or recent infections.  Past Medical History:  Diagnosis Date  . Arthritis   . Chronic back pain   . Closed wedge compression fracture of L1 vertebra ([place]) 2004  . Hypertension   . Sciatica     Patient Active Problem List   Diagnosis Date Noted  . Bradycardia 10/17/2019  . Malnutrition of moderate degree 07/01/2019  . Closed displaced fracture of left femoral neck ([place]) 06/28/2019  . Atrial fibrillation with rapid ventricular response ([place]) 06/28/2019  . DDD (degenerative disc disease), lumbar 06/15/2019  . HTN (hypertension) 06/15/2019  . Hx: UTI (urinary tract infection) 06/15/2019  . Lumbar spinal stenosis 06/15/2019  . Lumbar spondylolysis 06/15/2019  . Osteoarthrosis 06/15/2019  . Venous ulcer of ankle, right ([place]) 04/07/2019  . GERD without esophagitis 03/23/2019  . Constipation due to opioid therapy 03/23/2019  . Vitamin D deficiency 03/23/2019  . S/P right hip fracture with cannulated screw placement 03/18/19   . Anxiety   . Pressure injury of skin 03/18/2019  . Closed right hip fracture ([place]) 03/17/2019  . Sinus tachycardia 03/17/2019  . Arterial leg ulcer ([place]) 11/02/2018  . Lymphedema 11/02/2018  . Closed compression fracture of thoracic vertebra ([place]) 03/15/2018  . History of breast cancer 10/23/2017  . History of DVT (deep vein  thrombosis) 10/23/2017  . Chronic back pain 10/23/2017  . Benign essential HTN 10/23/2017  . Hypertensive urgency 10/23/2017  . Chronic, continuous use of opioids 10/23/2017  . Anemia 08/18/2016  . Chronic fatigue 05/13/2016  . Pernicious anemia 05/13/2016  . Dyslipidemia 01/15/2016  . History of right mastectomy 09/17/2015  . Conjunctival hemorrhage, left eye 02/13/2015  . Mixed incontinence 11/28/2014  . Breast cancer, stage 1 ([place]) 01/28/2012    Past Surgical History:  Procedure Laterality Date  . ANTERIOR APPROACH HEMI HIP ARTHROPLASTY Left 06/29/2019   Procedure: Left ANTERIOR APPROACH HEMI HIP ARTHROPLASTY;  Surgeon: Leandrew Koyanagi, MD;  Location: [facility];  Service: Orthopedics;  Laterality: Left;  . APPENDECTOMY    . HIP PINNING,CANNULATED Right 03/18/2019   Procedure: CANNULATED HIP PINNING;  Surgeon: Carole Civil, MD;  Location: [facility];  Service: Orthopedics;  Laterality: Right;  11 am      OB History   No obstetric history on file.      Home Medications    Prior to Admission medications   Medication Sig Start Date End Date Taking? Authorizing Provider  acetaminophen (TYLENOL) 325 MG tablet Take 650 mg by mouth 4 (four) times daily.   Yes [provider]  amLODipine (NORVASC) 5 MG tablet Take 5 mg by mouth daily.   Yes [provider]  apixaban (ELIQUIS) 2.5 MG TABS tablet Take 2.5 mg by mouth 2 (two) times daily.    Yes [provider]  digoxin (LANOXIN) 0.25 MG  tablet Take 1 tablet (0.25 mg total) by mouth daily. 07/12/19  Yes Alma Friendly, MD  diltiazem (CARDIZEM CD) 240 MG 24 hr capsule Take 1 capsule (240 mg total) by mouth daily. 07/12/19  Yes Alma Friendly, MD  docusate sodium (COLACE) 100 MG capsule Take 100 mg by mouth 2 (two) times daily as needed for constipation.    Yes [provider]  loperamide (IMODIUM A-D) 2 MG tablet Take 2 mg by mouth 4 (four) times daily as needed for diarrhea or loose stools.   Yes  [provider]  lubiprostone (AMITIZA) 24 MCG capsule Take 24 mcg by mouth 2 (two) times daily.   Yes [provider]  metoprolol tartrate (LOPRESSOR) 50 MG tablet Take 1 tablet (50 mg total) by mouth 2 (two) times daily. 07/11/19  Yes Alma Friendly, MD  Multiple Vitamins-Minerals (MULTIVITAMIN WITH MINERALS) tablet Take 1 tablet by mouth daily.   Yes [provider]  ondansetron (ZOFRAN-ODT) 4 MG disintegrating tablet Take 4 mg by mouth every 6 (six) hours as needed for nausea or vomiting.  07/23/17  Yes [provider]  oxyCODONE (OXYCONTIN) 15 mg 12 hr tablet Take 1 tablet (15 mg total) by mouth every 12 (twelve) hours. 09/28/19  Yes Terald Sleeper, PA-C  pantoprazole (PROTONIX) 20 MG tablet Take 1 tablet (20 mg total) by mouth daily. 03/22/19 03/21/20 Yes Barton Dubois, MD  tiZANidine (ZANAFLEX) 4 MG tablet Take 4 mg by mouth every 6 (six) hours as needed for muscle spasms.   Yes [provider]  cephALEXin (KEFLEX) 250 MG capsule Take 1 capsule (250 mg total) by mouth 2 (two) times daily. Patient not taking: Reported on 10/17/2019 08/15/19   Terald Sleeper, PA-C    Family History Family History  Problem Relation Age of Onset  . Cancer Maternal Grandfather     Social History Social History   Tobacco Use  . Smoking status: Never Smoker  . Smokeless tobacco: Never Used  Substance Use Topics  . Alcohol use: Not on file  . Drug use: Not on file     Allergies   Cymbalta [duloxetine hcl]   Review of Systems Review of Systems  Unable to perform ROS: Dementia     Physical Exam Updated Vital Signs BP (!) 100/36   Pulse (!) 46   Temp 99.1 F (37.3 C)   Resp 17   Wt 52.2 kg   SpO2 93%   BMI 16.04 kg/m   Physical Exam Vitals signs and nursing note reviewed.  Constitutional:      Appearance: She is well-developed.  HENT:     Head: Normocephalic and atraumatic.  Neck:     Musculoskeletal: Normal range of motion and neck  supple.  Cardiovascular:     Rate and Rhythm: Bradycardia present.  Pulmonary:     Effort: Pulmonary effort is normal.  Abdominal:     General: Bowel sounds are normal.  Skin:    General: Skin is warm and dry.  Neurological:     Mental Status: She is alert. She is disoriented.      ED Treatments / Results  Labs (all labs ordered are listed, but only abnormal results are displayed) Labs Reviewed  COMPREHENSIVE METABOLIC PANEL - Abnormal; Notable for the following components:      Result Value   Glucose, Bld 54 (*)    BUN 26 (*)    Creatinine, Ser 1.42 (*)    Calcium 8.4 (*)    Albumin  3.4 (*)    AST 71 (*)    Alkaline Phosphatase 295 (*)    GFR calc non Af Amer 32 (*)    GFR calc Af Amer 37 (*)    All other components within normal limits  CBC WITH DIFFERENTIAL/PLATELET - Abnormal; Notable for the following components:   WBC 10.7 (*)    RDW 17.4 (*)    Neutro Abs 9.0 (*)    Abs Immature Granulocytes 0.10 (*)    All other components within normal limits  DIGOXIN LEVEL - Abnormal; Notable for the following components:   Digoxin Level 2.8 (*)    All other components within normal limits  PROTIME-INR - Abnormal; Notable for the following components:   Prothrombin Time 17.8 (*)    INR 1.5 (*)    All other components within normal limits  APTT - Abnormal; Notable for the following components:   aPTT 44 (*)    All other components within normal limits  LACTIC ACID, PLASMA - Abnormal; Notable for the following components:   Lactic Acid, Venous 2.6 (*)    All other components within normal limits  LACTIC ACID, PLASMA - Abnormal; Notable for the following components:   Lactic Acid, Venous 3.0 (*)    All other components within normal limits  URINALYSIS, ROUTINE W REFLEX MICROSCOPIC - Abnormal; Notable for the following components:   Color, Urine AMBER (*)    APPearance CLOUDY (*)    Hgb urine dipstick LARGE (*)    Protein, ur 100 (*)    Leukocytes,Ua TRACE (*)    RBC /  HPF >50 (*)    WBC, UA >50 (*)    Bacteria, UA RARE (*)    All other components within normal limits  CBG MONITORING, ED - Abnormal; Notable for the following components:   Glucose-Capillary 51 (*)    All other components within normal limits  CBG MONITORING, ED - Abnormal; Notable for the following components:   Glucose-Capillary 121 (*)    All other components within normal limits  TROPONIN I (HIGH SENSITIVITY) - Abnormal; Notable for the following components:   Troponin I (High Sensitivity) 154 (*)    All other components within normal limits  SARS CORONAVIRUS 2 BY RT PCR (HOSPITAL ORDER, [facility])  CULTURE, BLOOD (ROUTINE X 2)  CULTURE, BLOOD (ROUTINE X 2)  URINE CULTURE  TSH  MAGNESIUM  LACTIC ACID, PLASMA  TROPONIN I (HIGH SENSITIVITY)    EKG EKG Interpretation  Date/Time:  Monday October 17 2019 14:52:02 EST Ventricular Rate:  35 PR Interval:    QRS Duration: 91 QT Interval:  518 QTC Calculation: 396 R Axis:   27 Text Interpretation: Atrial fibrillation Anteroseptal infarct, age indeterminate Confirmed by Fredia Sorrow [id number]) on 10/17/2019 2:59:35 PM  ED ECG REPORT   Date: 10/17/2019  Rate: 63  Rhythm: atrial fibrillation  QRS Axis: normal  Intervals: normal  ST/T Wave abnormalities: nonspecific ST changes, nonspecific T wave changes and new TWI in the lateral leads  Conduction Disutrbances:none  Narrative Interpretation:   Old EKG Reviewed: changes noted  I have personally reviewed the EKG tracing and agree with the computerized printout as noted.    Radiology Dg Chest Port 1 View  Result Date: 10/17/2019 CLINICAL DATA:  Hypotension EXAM: PORTABLE CHEST 1 VIEW COMPARISON:  06/28/2019 chest radiograph. FINDINGS: Right rotated chest radiograph. Pacer pad overlies the left chest. Surgical clips overlie the right axilla. Stable cardiomediastinal silhouette with top-normal heart size. No pneumothorax. No  pleural effusion.  Lungs appear clear, with no acute consolidative airspace disease and no pulmonary edema. Vertebroplasty material overlies a lower thoracic vertebral compression fracture. IMPRESSION: Right rotated chest radiograph with no active cardiopulmonary disease. Electronically Signed   By: Ilona Sorrel M.D.   On: 10/17/2019 15:38    Procedures .Critical Care Performed by: Varney Biles, MD Authorized by: Varney Biles, MD   Critical care provider statement:    Critical care time (minutes):  58   Critical care was necessary to treat or prevent imminent or life-threatening deterioration of the following conditions:  Cardiac failure   Critical care was time spent personally by me on the following activities:  Discussions with consultants, evaluation of patient's response to treatment, examination of patient, ordering and performing treatments and interventions, ordering and review of laboratory studies, ordering and review of radiographic studies, pulse oximetry, re-evaluation of patient's condition, obtaining history from patient or surrogate and review of old charts   I assumed direction of critical care for this patient from another provider in my specialty: yes     (including critical care time)  Medications Ordered in ED Medications  atropine 1 MG/10ML injection 0.5 mg (0.5 mg Intravenous Given 10/17/19 1617)  EPINEPHrine (ADRENALIN) 4 mg in dextrose 5 % 250 mL (0.016 mg/mL) infusion (15 mcg/min Intravenous Rate/Dose Change 10/17/19 2021)  lactated ringers infusion ( Intravenous Restarted 10/17/19 2021)  metroNIDAZOLE (FLAGYL) IVPB 500 mg (has no administration in time range)  ceFEPIme (MAXIPIME) 2 g in sodium chloride 0.9 % 100 mL IVPB (has no administration in time range)  vancomycin (VANCOCIN) IVPB 1000 mg/200 mL premix (has no administration in time range)  vancomycin (VANCOCIN) IVPB 750 mg/150 ml premix (has no administration in time range)  ceFEPIme (MAXIPIME) 2 g in sodium chloride 0.9 %  100 mL IVPB (has no administration in time range)  sodium chloride 0.9 % bolus 500 mL (0 mLs Intravenous Stopped 10/17/19 1609)  sodium chloride 0.9 % bolus 500 mL (0 mLs Intravenous Stopped 10/17/19 1637)  lactated ringers bolus 500 mL (0 mLs Intravenous Stopped 10/17/19 1834)  digoxin immune fab (DIGIFAB) 3 vial in sodium chloride 0.9 % 50 mL IVPB (0 vials Intravenous Stopped 10/17/19 1743)  ziprasidone (GEODON) injection 10 mg (10 mg Intramuscular Given 10/17/19 1751)  sterile water (preservative free) injection (  Given 10/17/19 1759)  lactated ringers bolus 500 mL (0 mLs Intravenous Stopped 10/17/19 1913)  dextrose 50 % solution 12.5 g (12.5 g Intravenous Given 10/17/19 1846)  lactated ringers bolus 500 mL (0 mLs Intravenous Stopped 10/17/19 2021)     Initial Impression / Assessment and Plan / ED Course  I have reviewed the triage vital signs and the nursing notes.  Pertinent labs & imaging results that were available during my care of the patient were reviewed by me and considered in my medical decision making (see chart for details).  Clinical Course as of Oct 17 2107  Mon Oct 17, 2019  1732 Heart rate has improved to 60s.  Patient is on epi drip.  2 vials of DigiFab provided.  BP has improved as well. Still awaiting call from cardiology.  Secretary has made a second consult call.  Pulse Rate(!): 48 [AN]  1737 Elevated dig level.  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  Digibind ordered.  Digoxin, Serum(!!): 2.8 [AN]  1743 Restraints ordered as patient is combative and agitated. I have reassessed her and she was sleeping at the and trying her IV.  She is continued to behave that we  will give her IM Geodon.  Nurses unable to get an accurate blood pressure reading because of her agitation.   [AN]  1921 No response from cardiology.  Medicine made aware.  They will likely put in a consult tomorrow.   [AN]  1921 Equivocal UA.  Urinalysis, Routine w reflex microscopic(!) [AN]    Clinical  Course User Index [AN] Varney Biles, MD       85 year old comes in a chief complaint of low heart rate.  She is noted to be hypothermic, hypotensive and bradycardic.  She has history of A. fib and is on calcium channel blocker and digoxin.  She is on relatively high dose for dig, and I suspect that she could have dig toxicity.  Family denies any new medication or recent illnesses.  Her appetite allegedly has been fine.  AKI electrolyte abnormalities other possibilities along with metabolic issues like hypothyroidism.  Sick sinus syndrome also possible.  We will order atropine.  She is on a cardiac monitor.  We will start her on epi drip if she does not have significant response to atropine and remains hypotensive.  We will also consider transcutaneous pacing if she gets unstable.  Final Clinical Impressions(s) / ED Diagnoses   Final diagnoses:  Symptomatic bradycardia  Poisoning by digoxin, accidental or unintentional, initial encounter    ED Discharge Orders    None       Varney Biles, MD 10/17/19 2108

## 2019-10-17 NOTE — ED Triage Notes (Signed)
Pt from [facility]. Pt was found to be bradycardiac and hypotensive.  HR in 30's and 99/48.   Pt c/o of back pain. A/o x4

## 2019-10-17 NOTE — ED Notes (Signed)
EDP made aware of pt

## 2019-10-17 NOTE — H&P (Signed)
History and Physical    Betty Carpenter [id number] DOB: [birth year] DOA: 10/17/2019  PCP: Terald Sleeper, PA-C   Patient coming from: [facility], long-term resident  I have personally briefly reviewed patient's old medical records in [place]  Chief Complaint: Bradycardia, Confusion  HPI: Betty Carpenter is a 85 y.o. female with medical history significant for DVT, hypertension, atrial fibrillation with RVR.  Patient was brought to the ED from [facility] with reports of bradycardia and hypotension, with confusion.  Heart rate was in the 30s, blood pressure 99/48.  Patient at that time was alert and oriented x4, complaining of back pain.  She subsequently became agitated and had to be given Geodon.  On my evaluation patient is awake but appears sedated, not responding verbally or to directions. Patient's daughter [name] tells me that when she saw patients 4 days ago, it was like patient was drifting away while she was talking to her, this is not unusual for her.  Patient had no complaints, but has chronic poor p.o. intake.  She has been complaining of the same back pain for 30 to 40 years and is on pain medication for this.  At baseline patient ambulates with a wheelchair, needs assistance with most activities of daily living, recognize family but cannot hold a conversation.  ED Course: Heart rate down to 30s.  Blood pressure systolic down to 99991111.  Lactic acid 2.6 > 3.  UA with trace leukocytes, rare bacteria, greater than 50 WBC.  Portable chest x-ray negative for acute disease.  N/s 1 L bolus given.  Digoxin level 2.8.  Slight improvement with 0.5 mg of atropine given.  Patient subsequently put placed on epinephrine drip.  Hospitalist to admit for bradycardia.  Review of Systems: Unable to obtain due to altered mental status.  Past Medical History:  Diagnosis Date   Arthritis    Chronic back pain    Closed wedge compression fracture of L1 vertebra ([place]) 2004    Hypertension    Sciatica     Past Surgical History:  Procedure Laterality Date   ANTERIOR APPROACH HEMI HIP ARTHROPLASTY Left 06/29/2019   Procedure: Left ANTERIOR APPROACH HEMI HIP ARTHROPLASTY;  Surgeon: Leandrew Koyanagi, MD;  Location: [facility];  Service: Orthopedics;  Laterality: Left;   APPENDECTOMY     HIP PINNING,CANNULATED Right 03/18/2019   Procedure: CANNULATED HIP PINNING;  Surgeon: Carole Civil, MD;  Location: [facility];  Service: Orthopedics;  Laterality: Right;  11 am      reports that she has never smoked. She has never used smokeless tobacco. No history on file for alcohol and drug.  Allergies  Allergen Reactions   Cymbalta [Duloxetine Hcl]     " couldn't make water"- unable to urinate    Family History  Problem Relation Age of Onset   Cancer Maternal Grandfather     Prior to Admission medications   Medication Sig Start Date End Date Taking? Authorizing Provider  acetaminophen (TYLENOL) 325 MG tablet Take 650 mg by mouth 4 (four) times daily.   Yes [provider]  amLODipine (NORVASC) 5 MG tablet Take 5 mg by mouth daily.   Yes [provider]  apixaban (ELIQUIS) 2.5 MG TABS tablet Take 2.5 mg by mouth 2 (two) times daily.    Yes [provider]  digoxin (LANOXIN) 0.25 MG tablet Take 1 tablet (0.25 mg total) by mouth daily. 07/12/19  Yes Alma Friendly, MD  diltiazem (CARDIZEM CD) 240 MG  24 hr capsule Take 1 capsule (240 mg total) by mouth daily. 07/12/19  Yes Alma Friendly, MD  docusate sodium (COLACE) 100 MG capsule Take 100 mg by mouth 2 (two) times daily as needed for constipation.    Yes [provider]  loperamide (IMODIUM A-D) 2 MG tablet Take 2 mg by mouth 4 (four) times daily as needed for diarrhea or loose stools.   Yes [provider]  lubiprostone (AMITIZA) 24 MCG capsule Take 24 mcg by mouth 2 (two) times daily.   Yes [provider]  metoprolol tartrate (LOPRESSOR) 50 MG tablet Take 1  tablet (50 mg total) by mouth 2 (two) times daily. 07/11/19  Yes Alma Friendly, MD  Multiple Vitamins-Minerals (MULTIVITAMIN WITH MINERALS) tablet Take 1 tablet by mouth daily.   Yes [provider]  ondansetron (ZOFRAN-ODT) 4 MG disintegrating tablet Take 4 mg by mouth every 6 (six) hours as needed for nausea or vomiting.  07/23/17  Yes [provider]  oxyCODONE (OXYCONTIN) 15 mg 12 hr tablet Take 1 tablet (15 mg total) by mouth every 12 (twelve) hours. 09/28/19  Yes Terald Sleeper, PA-C  pantoprazole (PROTONIX) 20 MG tablet Take 1 tablet (20 mg total) by mouth daily. 03/22/19 03/21/20 Yes Barton Dubois, MD  tiZANidine (ZANAFLEX) 4 MG tablet Take 4 mg by mouth every 6 (six) hours as needed for muscle spasms.   Yes [provider]  cephALEXin (KEFLEX) 250 MG capsule Take 1 capsule (250 mg total) by mouth 2 (two) times daily. Patient not taking: Reported on 10/17/2019 08/15/19   Terald Sleeper, PA-C    Physical Exam: Vitals:   10/17/19 1951 10/17/19 1952 10/17/19 1953 10/17/19 1954  BP:  (!) 101/42  (!) 100/40  Pulse: (!) 32 87 (!) 43 (!) 44  Resp: 16 14 15 16   Temp: 98.8 F (37.1 C) 98.8 F (37.1 C) 98.8 F (37.1 C) 98.8 F (37.1 C)  TempSrc:      SpO2: 91% 97% 93% 92%  Weight:        Constitutional: Appears awake with eyes open but sedated. Vitals:   10/17/19 1951 10/17/19 1952 10/17/19 1953 10/17/19 1954  BP:  (!) 101/42  (!) 100/40  Pulse: (!) 32 87 (!) 43 (!) 44  Resp: 16 14 15 16   Temp: 98.8 F (37.1 C) 98.8 F (37.1 C) 98.8 F (37.1 C) 98.8 F (37.1 C)  TempSrc:      SpO2: 91% 97% 93% 92%  Weight:       Eyes: PERRL, lids and conjunctivae normal ENMT: Mucous membranes are dry. Posterior pharynx clear of any exudate or lesions. Neck: normal, supple, no masses, no thyromegaly Respiratory: clear to auscultation bilaterally, no wheezing, no crackles. Normal respiratory effort. No accessory muscle use.  Cardiovascular: Bradycardic regular rate  and rhythm, no murmurs / rubs / gallops. No extremity edema. 2+ pedal pulses.  Abdomen: no tenderness, no masses palpated. No hepatosplenomegaly. Bowel sounds positive.  Musculoskeletal: no clubbing / cyanosis. No joint deformity upper and lower extremities. Good ROM, no contractures. Normal muscle tone.  Skin: no rashes, lesions, ulcers. No induration Neurologic: No gross facial asymmetry.  Unable to examine due to altered mental status Psychiatric: unable To assess due to altered mental status.  Labs on Admission: I have personally reviewed following labs and imaging studies  CBC: Recent Labs  Lab 10/17/19 1502  WBC 10.7*  NEUTROABS 9.0*  HGB 12.2  HCT 38.8  MCV 90.9  PLT 347  Basic Metabolic Panel: Recent Labs  Lab 10/17/19 1502  NA 137  K 3.6  CL 100  CO2 23  GLUCOSE 54*  BUN 26*  CREATININE 1.42*  CALCIUM 8.4*   Liver Function Tests: Recent Labs  Lab 10/17/19 1502  AST 71*  ALT 25  ALKPHOS 295*  BILITOT 0.7  PROT 6.7  ALBUMIN 3.4*   Coagulation Profile: Recent Labs  Lab 10/17/19 1502  INR 1.5*   CBG: Recent Labs  Lab 10/17/19 1842 10/17/19 1915  GLUCAP 51* 121*   Urine analysis:    Component Value Date/Time   COLORURINE AMBER (A) 10/17/2019 1530   APPEARANCEUR CLOUDY (A) 10/17/2019 1530   LABSPEC 1.029 10/17/2019 1530   PHURINE 5.0 10/17/2019 1530   GLUCOSEU NEGATIVE 10/17/2019 1530   HGBUR LARGE (A) 10/17/2019 [place] 10/17/2019 [place] 10/17/2019 1530   PROTEINUR 100 (A) 10/17/2019 1530   NITRITE NEGATIVE 10/17/2019 1530   LEUKOCYTESUR TRACE (A) 10/17/2019 1530    Radiological Exams on Admission: Dg Chest Port 1 View  Result Date: 10/17/2019 CLINICAL DATA:  Hypotension EXAM: PORTABLE CHEST 1 VIEW COMPARISON:  06/28/2019 chest radiograph. FINDINGS: Right rotated chest radiograph. Pacer pad overlies the left chest. Surgical clips overlie the right axilla. Stable cardiomediastinal silhouette with  top-normal heart size. No pneumothorax. No pleural effusion. Lungs appear clear, with no acute consolidative airspace disease and no pulmonary edema. Vertebroplasty material overlies a lower thoracic vertebral compression fracture. IMPRESSION: Right rotated chest radiograph with no active cardiopulmonary disease. Electronically Signed   By: Ilona Sorrel M.D.   On: 10/17/2019 15:38    EKG: Independently reviewed.  Sinus bradycardia.  Low voltages diffusely.  Subsequent EKG shows accelerated junctional rhythm.  Assessment/Plan Active Problems:   Bradycardia   Metabolic encephalopathy- with baseline dementia. likely from hypotension and possible sepsis.  Meets SIRS criteria-with hypothermia temperature 95.5, hypotension, with lactic acidosis 2.6 > 3. Also with hypoglycemia glucose of 54.  Hypotension likely also from bradycardia.  Chest x-ray negative for acute cardio pulmonary disease.  UA- > 50 WBCs, trace leukocytes.  Rare bacteria.  Required 10 mg of Geodon in ED -Obtain head CT -Follow-up blood and urine cultures obtained in ED -Broad-spectrum antibiotics with IV vancomycin, cefepime and metronidazole - 2 L bolus normal saline given with improvement in blood pressure, cont D5 n/s + 20 kcl 100cc/hr x 1 day -Trend lactic acid -BMP, CBC a.m.  Bradycardia-heart rate down to 30s.  With hypotension.  Likely secondary to home medications Cardizem, metoprolol, and digoxin given for atrial fibrillation.  Transient accelerated junctional rhythm lasting about 5 minutes in ED-see EKG. Transient improvement in heart rate with atropine 0.5, epinephrine subsequently started.  Mild troponin elevation at 154, likely from demand ischemia from hypotension and bradycardia possibly sepsis. -Hold home Cardizem, metoprolol and digoxin, may need adjustment of his medication on discharge. -Continue epinephrine for now -Talked to Daughter about needing central line, for now she does not want anything too aggressive,  wants to continue epinephrine via peripheral line. -Cardiology consult in a.m. - Check TSH, Magnesium, trend troponin.   Hypoglycemia-glucose 54, not on oral hypoglycemic agents. -D50 50 mils given, continue dextrose containing fluids. - CBG q2 h x 3  Acute kidney injury-creatinine elevated at 1.42, baseline 0.6-0.8.  Likely prerenal in the setting of sepsis and hypotension likely from bradycardia. -Hydrate -BMP a.m.  Atrial fibrillation-currently bradycardic.  On anticoagulation.  Digoxin level elevated at 2.8. -Hold home Eliquis for now while n.p.o., improvement  in mental status -Hold home metoprolol, Cardizem, digoxin.   DVT prophylaxis: Lovenox, resume home Eliquis when taking p.o. Code Status: DNR, per daughter who is healthcare power of attorney, patient is DNR and has a living will that states same.  Family Communication: Patient's daughter Dan Maker who is healthcare power of attorney, verity of illness explained. Disposition Plan: > 2 days Consults called: Cardiology consultation in a.m. Admission status: InPatient, ICU I certify that at the point of admission it is my clinical judgment that the patient will require inpatient hospital care spanning beyond 2 midnights from the point of admission due to high intensity of service, high risk for further deterioration and high frequency of surveillance required. The following factors support the patient status of inpatient: Bradycardia requiring IV epinephrine and stepdown level of care.   Bethena Roys MD [facility]  10/17/2019, 9:16 PM

## 2019-10-18 ENCOUNTER — Encounter (HOSPITAL_COMMUNITY): Payer: Self-pay | Admitting: Student

## 2019-10-18 DIAGNOSIS — R6510 Systemic inflammatory response syndrome (SIRS) of non-infectious origin without acute organ dysfunction: Secondary | ICD-10-CM

## 2019-10-18 DIAGNOSIS — T460X1A Poisoning by cardiac-stimulant glycosides and drugs of similar action, accidental (unintentional), initial encounter: Secondary | ICD-10-CM

## 2019-10-18 DIAGNOSIS — E876 Hypokalemia: Secondary | ICD-10-CM

## 2019-10-18 DIAGNOSIS — I482 Chronic atrial fibrillation, unspecified: Secondary | ICD-10-CM

## 2019-10-18 DIAGNOSIS — N179 Acute kidney failure, unspecified: Secondary | ICD-10-CM

## 2019-10-18 DIAGNOSIS — F0391 Unspecified dementia with behavioral disturbance: Secondary | ICD-10-CM

## 2019-10-18 DIAGNOSIS — I4819 Other persistent atrial fibrillation: Secondary | ICD-10-CM

## 2019-10-18 DIAGNOSIS — R001 Bradycardia, unspecified: Principal | ICD-10-CM

## 2019-10-18 DIAGNOSIS — E162 Hypoglycemia, unspecified: Secondary | ICD-10-CM

## 2019-10-18 LAB — MRSA PCR SCREENING: MRSA by PCR: NEGATIVE

## 2019-10-18 LAB — BASIC METABOLIC PANEL
Anion gap: 13 (ref 5–15)
Anion gap: 10 (ref 5–15)
BUN: 26 mg/dL — ABNORMAL HIGH (ref 8–23)
BUN: 23 mg/dL (ref 8–23)
CO2: 21 mmol/L — ABNORMAL LOW (ref 22–32)
CO2: 23 mmol/L (ref 22–32)
Calcium: 8 mg/dL — ABNORMAL LOW (ref 8.9–10.3)
Calcium: 8 mg/dL — ABNORMAL LOW (ref 8.9–10.3)
Chloride: 105 mmol/L (ref 98–111)
Chloride: 107 mmol/L (ref 98–111)
Creatinine, Ser: 1.37 mg/dL — ABNORMAL HIGH (ref 0.44–1.00)
Creatinine, Ser: 1.13 mg/dL — ABNORMAL HIGH (ref 0.44–1.00)
GFR calc Af Amer: 39 mL/min — ABNORMAL LOW (ref >60)
GFR calc Af Amer: 49 mL/min — ABNORMAL LOW (ref >60)
GFR calc non Af Amer: 33 mL/min — ABNORMAL LOW (ref >60)
GFR calc non Af Amer: 42 mL/min — ABNORMAL LOW (ref >60)
Glucose, Bld: 152 mg/dL — ABNORMAL HIGH (ref 70–99)
Glucose, Bld: 110 mg/dL — ABNORMAL HIGH (ref 70–99)
Potassium: 2.8 mmol/L — ABNORMAL LOW (ref 3.5–5.1)
Potassium: 3.7 mmol/L (ref 3.5–5.1)
Sodium: 139 mmol/L (ref 135–145)
Sodium: 140 mmol/L (ref 135–145)

## 2019-10-18 LAB — CBC
HCT: 34.5 % — ABNORMAL LOW (ref 36.0–46.0)
Hemoglobin: 10.7 g/dL — ABNORMAL LOW (ref 12.0–15.0)
MCH: 28.5 pg (ref 26.0–34.0)
MCHC: 31 g/dL (ref 30.0–36.0)
MCV: 92 fL (ref 80.0–100.0)
Platelets: 392 10*3/uL (ref 150–400)
RBC: 3.75 MIL/uL — ABNORMAL LOW (ref 3.87–5.11)
RDW: 17.8 % — ABNORMAL HIGH (ref 11.5–15.5)
WBC: 15.5 10*3/uL — ABNORMAL HIGH (ref 4.0–10.5)
nRBC: 0 % (ref 0.0–0.2)

## 2019-10-18 LAB — DIGOXIN LEVEL: Digoxin Level: 2.8 ng/mL (ref 0.8–2.0)

## 2019-10-18 LAB — PROCALCITONIN: Procalcitonin: 0.48 ng/mL

## 2019-10-18 MED ORDER — POTASSIUM CHLORIDE 10 MEQ/100ML IV SOLN
10.0000 meq | INTRAVENOUS | Status: AC
  Administered 2019-10-18 (×3): 10 meq via INTRAVENOUS
  Filled 2019-10-18 (×2): qty 100

## 2019-10-18 MED ORDER — ORAL CARE MOUTH RINSE
15.0000 mL | Freq: Two times a day (BID) | OROMUCOSAL | Status: DC
  Administered 2019-10-18 – 2019-10-20 (×6): 15 mL via OROMUCOSAL

## 2019-10-18 MED ORDER — METOPROLOL TARTRATE 5 MG/5ML IV SOLN
2.5000 mg | Freq: Four times a day (QID) | INTRAVENOUS | Status: DC | PRN
  Administered 2019-10-19: 2.5 mg via INTRAVENOUS
  Filled 2019-10-18: qty 5

## 2019-10-18 MED ORDER — POTASSIUM CL IN DEXTROSE 5% 20 MEQ/L IV SOLN
20.0000 meq | INTRAVENOUS | Status: DC
  Filled 2019-10-18 (×2): qty 1000

## 2019-10-18 MED ORDER — MAGNESIUM SULFATE IN D5W 1-5 GM/100ML-% IV SOLN
1.0000 g | Freq: Once | INTRAVENOUS | Status: AC
  Administered 2019-10-18: 1 g via INTRAVENOUS
  Filled 2019-10-18: qty 100

## 2019-10-18 MED ORDER — FENTANYL CITRATE (PF) 100 MCG/2ML IJ SOLN
100.0000 ug | Freq: Once | INTRAMUSCULAR | Status: AC
  Administered 2019-10-18: 100 ug via INTRAVENOUS
  Filled 2019-10-18: qty 2

## 2019-10-18 MED ORDER — FENTANYL CITRATE (PF) 100 MCG/2ML IJ SOLN
50.0000 ug | INTRAMUSCULAR | Status: AC | PRN
  Administered 2019-10-19 – 2019-10-20 (×3): 50 ug via INTRAVENOUS
  Filled 2019-10-18 (×3): qty 2

## 2019-10-18 MED ORDER — CHLORHEXIDINE GLUCONATE CLOTH 2 % EX PADS
6.0000 | MEDICATED_PAD | Freq: Every day | CUTANEOUS | Status: DC
  Administered 2019-10-18 – 2019-10-20 (×3): 6 via TOPICAL

## 2019-10-18 MED ORDER — DEXTROSE 10 % IV SOLN: INTRAVENOUS | Status: DC

## 2019-10-18 MED ORDER — POTASSIUM CL IN DEXTROSE 5% 20 MEQ/L IV SOLN
20.0000 meq | INTRAVENOUS | Status: DC
  Administered 2019-10-19 (×2): 20 meq via INTRAVENOUS
  Filled 2019-10-18 (×4): qty 1000

## 2019-10-18 MED ORDER — LORAZEPAM 2 MG/ML IJ SOLN
0.5000 mg | Freq: Once | INTRAMUSCULAR | Status: AC
  Administered 2019-10-18: 0.5 mg via INTRAVENOUS
  Filled 2019-10-18: qty 1

## 2019-10-18 NOTE — Progress Notes (Signed)
No new orders for page of vent bigeminy. Dr. Josephine Cables repaged d/t patients agitation and anxiety level. Pt pulling at leads and trying to get OOB, screaming from room. Waiting for call back/orders.

## 2019-10-18 NOTE — Progress Notes (Signed)
Pt given 0.5mg  Ativan per MD order for agitation. Will continue to monitor

## 2019-10-18 NOTE — Progress Notes (Signed)
Dr. Josephine Cables made aware of patients rate of ventricular bigeminy. Waiting for orders/call back.

## 2019-10-18 NOTE — Progress Notes (Signed)
[facility] night shift.  The staff has reported tachycardia in the 110-125 range.  The patient seems to be uncomfortable and changes in her positioning have not been effective to control her discomfort.  She is usually on oxycodone ER 50 mg p.o. twice daily.  This was held along with her metoprolol, diltiazem and digoxin.  I think that her discomfort and tachycardia are likely due to the discontinuation of these medications.  She seems to have done better after she received 100 mcg of fentanyl IVP, but her heart rate still over 100 bpm, currently in the 100-115 bpm range.  She is currently n.p.o.  I will resume low-dose metoprolol and follow the cardiac telemetry closely.    There is also some concern about volume overload, but the patient is currently n.p.o. and needs a solution with dextrose.  Her sodium level has steadily climbed since admission from 136 to 140 mmol/L most recently.  She was hypokalemic earlier today.  I will change the infusion to dextrose 5% plus KCl 24mEq to avoid hypokalemia at a slower rate.  CBG monitoring as needed.  Tennis Must, MD.

## 2019-10-18 NOTE — Progress Notes (Signed)
Critical dig level of 2.8 called and I gave verbal report to Dr. Dyann Kief

## 2019-10-18 NOTE — Progress Notes (Signed)
PROGRESS NOTE    Betty Carpenter  KDT:[id number] DOB: 1925-04-01 DOA: 10/17/2019 PCP: Terald Sleeper, PA-C     Brief Narrative:  85 y.o. female with medical history significant for DVT, hypertension, atrial fibrillation with RVR.  Patient was brought to the ED from [facility] with reports of bradycardia and hypotension, with confusion.  Heart rate was in the 30s, blood pressure 99/48. Patient at that time was alert and oriented x4, complaining of back pain.  She subsequently became agitated and had to be given Geodon.  On my evaluation patient is awake but appears sedated, not responding verbally or to directions. Patient's daughter [name] tells me that when she saw patients 4 days ago, it was like patient was drifting away while she was talking to her, this is not unusual for her.  Patient had no complaints, but has chronic poor p.o. intake.  She has been complaining of the same back pain for 30 to 40 years and is on pain medication for this.  At baseline patient ambulates with a wheelchair, needs assistance with most activities of daily living, recognize family but cannot hold a conversation.  ED Course: Heart rate down to 30s.  Blood pressure systolic down to 99/83.  Lactic acid 2.6 > 3.  UA with trace leukocytes, rare bacteria, greater than 50 WBC.  Portable chest x-ray negative for acute disease.  N/s 1 L bolus given.  Digoxin level 2.8.  Slight improvement with 0.5 mg of atropine given.  Patient subsequently put placed on epinephrine drip.  Hospitalist to admit for bradycardia.   Assessment & Plan: Acute metabolic encephalopathy/hypertension -Patient met SIRS criteria on presentation with hypothermia, hypotension, hypoglycemia and lactic acidosis. -No source of infection appreciated on essentially sepsis rule out. -At this moment vital signs are stable and patient is off pressors. -Lactic acid trending down -She is afebrile. -Presentation most likely secondary to digoxin toxicity.   Atrial fibrillation/bradycardia -On presentation heart rate down to the 30s -Patient with underlying history of atrial fibrillation -Will continue holding Cardizem, metoprolol and digoxin (which has been discontinue). -Elevated troponin in the setting of demand ischemia. -Cardiology on board will follow recommendations. -Potassium is low and will be corrected to minimize further effects on abnormal heart rate and potentiation of digoxin toxicity. -Patient received Digibind.  Most recent level 2.8. -Continue holding Eliquis while n.p.o. -Closely follow patient's heart rate and recommendations by cardiology service at time of resumption of medications to control her rhythm.  Hypoglycemia -Glucose 54 on presentation -No chronically on hypoglycemic agents. -Continue to closely monitor CBGs patient is now eating -Continue D5 infusion.  Acute kidney injury -Patient creatinine baseline 0.6-0.8 -Most likely prerenal azotemia in the setting of poor oral intake, hypertension and bradycardia. -Continue IV fluids -Creatinine trending down appropriately.  Hypokalemia -Potassium 2.8 -IV repletion initiated -Patient will also receive 1 g of magnesium -Follow electrolytes trend and further replete as needed. -Continue telemetry monitoring.   DVT prophylaxis:  Code Status:  Family Communication:  Disposition Plan:   Consultants:   Cardiology service.  Procedures:   See below for x-ray reports.  Antimicrobials:  Anti-infectives (From admission, onward)   Start     Dose/Rate Route Frequency Ordered Stop   10/19/19 2200  vancomycin (VANCOCIN) IVPB 750 mg/150 ml premix  Status:  Discontinued     750 mg 150 mL/hr over 60 Minutes Intravenous Every 48 hours 10/17/19 2016 10/18/19 1428   10/18/19 2200  ceFEPIme (MAXIPIME) 2 g in sodium chloride 0.9 % 100  mL IVPB  Status:  Discontinued     2 g 200 mL/hr over 30 Minutes Intravenous Every 24 hours 10/17/19 2016 10/18/19 1428   10/17/19  2030  metroNIDAZOLE (FLAGYL) IVPB 500 mg  Status:  Discontinued     500 mg 100 mL/hr over 60 Minutes Intravenous Every 8 hours 10/17/19 2001 10/18/19 1428   10/17/19 2015  ceFEPIme (MAXIPIME) 2 g in sodium chloride 0.9 % 100 mL IVPB     2 g 200 mL/hr over 30 Minutes Intravenous  Once 10/17/19 2012 10/18/19 0111   10/17/19 2015  vancomycin (VANCOCIN) IVPB 1000 mg/200 mL premix     1,000 mg 200 mL/hr over 60 Minutes Intravenous  Once 10/17/19 2012 10/18/19 0031       Subjective: Obtunded, not verbally responsive.  Currently afebrile with good oxygen saturation on room air.  Blood pressure stable; patient is off pressors currently.  Objective: Vitals:   10/18/19 0900 10/18/19 1113 10/18/19 1200 10/18/19 1300  BP: (!) 106/56  111/64 119/61  Pulse: 74  (!) 38 80  Resp: '18 17 17 18  ' Temp: 97.7 F (36.5 C) 98.2 F (36.8 C) 98.1 F (36.7 C) 97.9 F (36.6 C)  TempSrc:      SpO2: 94%  97% 95%  Weight:      Height:        Intake/Output Summary (Last 24 hours) at 10/18/2019 1432 Last data filed at 10/18/2019 1343 Gross per 24 hour  Intake 4101.38 ml  Output 30 ml  Net 4071.38 ml   Filed Weights   10/17/19 1439 10/18/19 0130 10/18/19 0435  Weight: 52.2 kg 52.3 kg 52.3 kg    Examination: General exam: Afebrile, obtunded and unable to follow any commands. Respiratory system: Good air movement bilaterally, no wheezing, no crackles, no oxygen supplementation requirement appreciated. Cardiovascular system: Irregular, rate controlled, positive systolic ejection murmur, no rubs, no gallops, no JVD.   Gastrointestinal system: Abdomen is nondistended, soft and nontender. No organomegaly or masses felt. Normal bowel sounds heard. Central nervous system: Able to move 4 limbs spontaneously; no following commands.  Currently obtunded and not verbally responsive. Extremities: No cyanosis or clubbing; no swelling. Skin: No rashes, no petechiae. Psychiatry: Unable to properly assess given  current mentation.  Patient with underlying dementia, unknown baseline.    Data Reviewed: I have personally reviewed following labs and imaging studies  CBC: Recent Labs  Lab 10/17/19 1502 10/18/19 0429  WBC 10.7* 15.5*  NEUTROABS 9.0*  --   HGB 12.2 10.7*  HCT 38.8 34.5*  MCV 90.9 92.0  PLT 347 372   Basic Metabolic Panel: Recent Labs  Lab 10/17/19 1502 10/17/19 1744 10/18/19 0429  NA 137  --  139  K 3.6  --  2.8*  CL 100  --  105  CO2 23  --  21*  GLUCOSE 54*  --  152*  BUN 26*  --  26*  CREATININE 1.42*  --  1.37*  CALCIUM 8.4*  --  8.0*  MG  --  2.1  --    GFR: Estimated Creatinine Clearance: 20.7 mL/min (A) (by C-G formula based on SCr of 1.37 mg/dL (H)).   Liver Function Tests: Recent Labs  Lab 10/17/19 1502  AST 71*  ALT 25  ALKPHOS 295*  BILITOT 0.7  PROT 6.7  ALBUMIN 3.4*   Coagulation Profile: Recent Labs  Lab 10/17/19 1502  INR 1.5*   CBG: Recent Labs  Lab 10/17/19 1842 10/17/19 1915  GLUCAP 51* 121*  Thyroid Function Tests: Recent Labs    10/17/19 1744  TSH 3.127   Urine analysis:    Component Value Date/Time   COLORURINE AMBER (A) 10/17/2019 1530   APPEARANCEUR CLOUDY (A) 10/17/2019 1530   LABSPEC 1.029 10/17/2019 1530   PHURINE 5.0 10/17/2019 1530   GLUCOSEU NEGATIVE 10/17/2019 1530   HGBUR LARGE (A) 10/17/2019 [place] 10/17/2019 [place] 10/17/2019 1530   PROTEINUR 100 (A) 10/17/2019 1530   NITRITE NEGATIVE 10/17/2019 1530   LEUKOCYTESUR TRACE (A) 10/17/2019 1530    Recent Results (from the past 240 hour(s))  SARS Coronavirus 2 by RT PCR (hospital order, performed in [facility] hospital lab) Nasopharyngeal Nasopharyngeal Swab     Status: None   Collection Time: 10/17/19  3:29 PM   Specimen: Nasopharyngeal Swab  Result Value Ref Range Status   SARS Coronavirus 2 NEGATIVE NEGATIVE Final    Comment: (NOTE) SARS-CoV-2 target nucleic acids are NOT DETECTED. The SARS-CoV-2 RNA is  generally detectable in upper and lower respiratory specimens during the acute phase of infection. The lowest concentration of SARS-CoV-2 viral copies this assay can detect is 250 copies / mL. A negative result does not preclude SARS-CoV-2 infection and should not be used as the sole basis for treatment or other patient management decisions.  A negative result may occur with improper specimen collection / handling, submission of specimen other than nasopharyngeal swab, presence of viral mutation(s) within the areas targeted by this assay, and inadequate number of viral copies (<250 copies / mL). A negative result must be combined with clinical observations, patient history, and epidemiological information. Fact Sheet for Patients:   StrictlyIdeas.no Fact Sheet for Healthcare Providers: BankingDealers.co.za This test is not yet approved or cleared  by the [place] FDA and has been authorized for detection and/or diagnosis of SARS-CoV-2 by FDA under an Emergency Use Authorization (EUA).  This EUA will remain in effect (meaning this test can be used) for the duration of the COVID-19 declaration under Section 564(b)(1) of the Act, 21 U.S.C. section 360bbb-3(b)(1), unless the authorization is terminated or revoked sooner. Performed at [facility], [address], [place], [place] [address]   Culture, blood (routine x 2)     Status: None (Preliminary result)   Collection Time: 10/17/19  3:53 PM   Specimen: BLOOD  Result Value Ref Range Status   Specimen Description BLOOD BOTTLES DRAWN AEROBIC ONLY LEFT ANTECUBITAL  Final   Special Requests Blood Culture adequate volume  Final   Culture   Final    NO GROWTH < 24 HOURS Performed at [facility], [address] [place], [place] [address]    Report Status PENDING  Incomplete  Culture, blood (routine x 2)     Status: None (Preliminary result)   Collection Time: 10/17/19  3:53 PM    Specimen: BLOOD LEFT WRIST  Result Value Ref Range Status   Specimen Description BLOOD LEFT WRIST  Final   Special Requests   Final    BOTTLES DRAWN AEROBIC AND ANAEROBIC Blood Culture adequate volume   Culture   Final    NO GROWTH < 24 HOURS Performed at [facility], [address], [place], [place] [address]    Report Status PENDING  Incomplete  MRSA PCR Screening     Status: None   Collection Time: 10/17/19 10:53 PM   Specimen: Nasopharyngeal  Result Value Ref Range Status   MRSA by PCR NEGATIVE NEGATIVE Final    Comment:  The GeneXpert MRSA Assay (FDA approved for NASAL specimens only), is one component of a comprehensive MRSA colonization surveillance program. It is not intended to diagnose MRSA infection nor to guide or monitor treatment for MRSA infections. Performed at [facility], [address] [place], [place] [address]      Radiology Studies: Ct Head Wo Contrast  Result Date: 10/17/2019 CLINICAL DATA:  Altered mental status EXAM: CT HEAD WITHOUT CONTRAST TECHNIQUE: Contiguous axial images were obtained from the base of the skull through the vertex without intravenous contrast. COMPARISON:  06/28/2019 FINDINGS: Brain: There is atrophy and chronic small vessel disease changes. No acute intracranial abnormality. Specifically, no hemorrhage, hydrocephalus, mass lesion, acute infarction, or significant intracranial injury. Vascular: No hyperdense vessel or unexpected calcification. Skull: No acute calvarial abnormality. Sinuses/Orbits: Visualized paranasal sinuses and mastoids clear. Orbital soft tissues unremarkable. Other: None IMPRESSION: Atrophy, chronic microvascular disease. No acute intracranial abnormality. Electronically Signed   By: Rolm Baptise M.D.   On: 10/17/2019 21:35   Dg Chest Port 1 View  Result Date: 10/17/2019 CLINICAL DATA:  Hypotension EXAM: PORTABLE CHEST 1 VIEW COMPARISON:  06/28/2019 chest radiograph. FINDINGS: Right rotated chest  radiograph. Pacer pad overlies the left chest. Surgical clips overlie the right axilla. Stable cardiomediastinal silhouette with top-normal heart size. No pneumothorax. No pleural effusion. Lungs appear clear, with no acute consolidative airspace disease and no pulmonary edema. Vertebroplasty material overlies a lower thoracic vertebral compression fracture. IMPRESSION: Right rotated chest radiograph with no active cardiopulmonary disease. Electronically Signed   By: Ilona Sorrel M.D.   On: 10/17/2019 15:38    Scheduled Meds: . Chlorhexidine Gluconate Cloth  6 each Topical Daily  . enoxaparin (LOVENOX) injection  30 mg Subcutaneous Q24H  . mouth rinse  15 mL Mouth Rinse BID   Continuous Infusions: . dextrose 5 % and 0.9 % NaCl with KCl 20 mEq/L 100 mL/hr at 10/18/19 1343     LOS: 1 day    Time spent: 35 minutes. Greater than 50% of this time was spent in direct contact with the patient, coordinating care and discussing relevant ongoing clinical issues, including elevated digoxin level, concern for toxicity and discussion with the specialists (cardiologist) on current case on approach.  Continue supportive care.  MRSA PCR negative and no clear source of infection appreciated.  Antibiotics will be discontinued.  Procalcitonin level will be checked. Continue supportive care.     Barton Dubois, MD [facility] Pager [phone]   10/18/2019, 2:32 PM

## 2019-10-18 NOTE — Progress Notes (Signed)
[facility] night shift.  Increased erythema on left pretibial area.  The patient is currently on cefepime and vancomycin.  Pictures taken for follow-up future comparison.  See below.     Tennis Must, MD

## 2019-10-18 NOTE — Progress Notes (Signed)
Patient's heart rate sustaining between 110-125. Patient seems to be uncomfortable in bed, repositioning ineffective. Patient also has not urinated since foley catheter removed. Bladder scan showed 600 mL liter urine. MD aware. Orders placed and followed. I&O catheterization performed with 450 mL urinary output. Patient also has increased swelling and redness to left lower leg compared to skin assessment upon admission. MD aware. Will continue to monitor patient.

## 2019-10-18 NOTE — Consult Note (Addendum)
Cardiology Consult    Patient ID: Betty Carpenter; [id number]; [birth year]   Admit date: 10/17/2019 Date of Consult: 10/18/2019  Primary Care Provider: Terald Sleeper, PA-C Primary Cardiologist: Pixie Casino, MD   Patient Profile    Betty Carpenter is a 85 y.o. female with past medical history of HTN, recently diagnosed atrial fibrillation (during admission in 06/2019), history of DVT, and history of breast cancer (s/p prior mastectomy) who is being seen today for the evaluation of bradycardia at the request of Dr. Denton Brick.   History of Present Illness    Ms. Sorce was most recently admitted to [facility] from 06/28/2019 to 07/01/2019 after suffering a mechanical fall at ALF and found to have a closed displaced fracture of the left femoral neck. She did undergo hemiarthroplasty on 06/29/2019.  During that admission, she was found to be in atrial fibrillation with RVR upon arrival to the ED and responded well to IV Cardizem and her PTA Lopressor was increased from 25 mg twice daily to 75 mg twice daily.  She was transitioned to Cardizem CD 240 mg daily, Lopressor 50 mg twice daily and Digoxin 0.12m daily (was not listed to continue this at the time of sign off on 07/03/2019) at the time of discharge along with being started on Eliquis 2.5 mg twice daily for anticoagulation once cleared from orthopedics. An echocardiogram was performed during admission and showed a preserved EF of 55 to 60% and she was noted to have moderate mitral regurgitation and mild aortic valve regurgitation with moderate AS.   [facility] after having been found to be bradycardiac with HR in the 30's and hypotensive with SBP in the 90's. Patient's family also reported she had been more confused over the past several days leading up to admission. Initial labs showed WBC 10.7, Hgb 12.2, platelets 347, Na+ 137, K+ 3.6, and creatinine 1.42 (previously 0.6 in 06/2019). Dig Level 2.8. COVID  negative. Lactic Acid 2.6. TSH 3.127. Mg 2.1. Initial HS Troponin 154 with repeat of 136. UA concerning for UTI with culture pending. CXR with no active cardiopulmonary disease. CT Head showed atrophy with chronic microvascular disease. Initial EKG showed a wavy baseline but appears most consistent with atrial fibrillation with slow-ventricular response, HR 35.  Atropine 0.526mwas administered x3. She also received Digifab and was ultimately placed on Epinephrine which has been weaned and discontinued.   She was started on broad-spectrum antibiotics with Vancomycin and Cefepime given the concern for sepsis. Received a significant amount of IVF for + 3.2 L.   In talking with the patient this morning, she is arousable but does not answer yes/no to questions. Patient's nurse reports she was agitated overnight and Ativan was administered. No family currently at the bedside. By review of telemetry, she remains in atrial fibrillation but HR has improved into the 70's to 80's. Still having frequent ectopy with episodes of ventricular bigeminy.     Past Medical History:  Diagnosis Date   Arthritis    Atrial fibrillation (HCC)    Chronic back pain    Closed wedge compression [place]1 [place]   Hypertension    Sciatica     Past Surgical History:  Procedure Laterality Date   ANTERIOR APPROACH HEMI HIP ARTHROPLASTY Left 06/29/2019   Procedure: Left ANTERIOR APPROACH HEMI HIP ARTHROPLASTY;  Surgeon: XuLeandrew KoyanagiMD;  [place] [facility];  Laterality: Left;  APPENDECTOMY     HIP PINNING,CANNULATED Right 03/18/2019   Procedure: CANNULATED HIP PINNING;  Surgeon: Carole Civil, MD;  Location: [facility];  Service: Orthopedics;  Laterality: Right;  11 am      Home Medications:  Prior to Admission medications   Medication Sig Start Date End Date Taking? Authorizing Provider  acetaminophen (TYLENOL) 325 MG tablet Take 650 mg by mouth 4 (four) times daily.    Yes [provider]  amLODipine (NORVASC) 5 MG tablet Take 5 mg by mouth daily.   Yes [provider]  apixaban (ELIQUIS) 2.5 MG TABS tablet Take 2.5 mg by mouth 2 (two) times daily.    Yes [provider]  digoxin (LANOXIN) 0.25 MG tablet Take 1 tablet (0.25 mg total) by mouth daily. 07/12/19  Yes Alma Friendly, MD  diltiazem (CARDIZEM CD) 240 MG 24 hr capsule Take 1 capsule (240 mg total) by mouth daily. 07/12/19  Yes Alma Friendly, MD  docusate sodium (COLACE) 100 MG capsule Take 100 mg by mouth 2 (two) times daily as needed for constipation.    Yes [provider]  loperamide (IMODIUM A-D) 2 MG tablet Take 2 mg by mouth 4 (four) times daily as needed for diarrhea or loose stools.   Yes [provider]  lubiprostone (AMITIZA) 24 MCG capsule Take 24 mcg by mouth 2 (two) times daily.   Yes [provider]  metoprolol tartrate (LOPRESSOR) 50 MG tablet Take 1 tablet (50 mg total) by mouth 2 (two) times daily. 07/11/19  Yes Alma Friendly, MD  Multiple Vitamins-Minerals (MULTIVITAMIN WITH MINERALS) tablet Take 1 tablet by mouth daily.   Yes [provider]  ondansetron (ZOFRAN-ODT) 4 MG disintegrating tablet Take 4 mg by mouth every 6 (six) hours as needed for nausea or vomiting.  07/23/17  Yes [provider]  oxyCODONE (OXYCONTIN) 15 mg 12 hr tablet Take 1 tablet (15 mg total) by mouth every 12 (twelve) hours. 09/28/19  Yes Terald Sleeper, PA-C  pantoprazole (PROTONIX) 20 MG tablet Take 1 tablet (20 mg total) by mouth daily. 03/22/19 03/21/20 Yes Barton Dubois, MD  tiZANidine (ZANAFLEX) 4 MG tablet Take 4 mg by mouth every 6 (six) hours as needed for muscle spasms.   Yes [provider]  cephALEXin (KEFLEX) 250 MG capsule Take 1 capsule (250 mg total) by mouth 2 (two) times daily. Patient not taking: Reported on 10/17/2019 08/15/19   Terald Sleeper, PA-C    Inpatient Medications: Scheduled Meds:   Chlorhexidine Gluconate Cloth  6 each Topical Daily   enoxaparin (LOVENOX) injection  30 mg Subcutaneous Q24H   mouth rinse  15 mL Mouth Rinse BID   Continuous Infusions:  ceFEPime (MAXIPIME) IV     dextrose 5 % and 0.9 % NaCl with KCl 20 mEq/L 100 mL/hr at 10/18/19 0759   epinephrine Stopped (10/18/19 0758)   metronidazole Stopped (10/18/19 0542)   potassium chloride     [START ON 10/19/2019] vancomycin     PRN Meds: acetaminophen **OR** acetaminophen, ondansetron **OR** ondansetron (ZOFRAN) IV  Allergies:    Allergies  Allergen Reactions   Cymbalta [Duloxetine Hcl]     " couldn't make water"- unable to urinate    Social History:   Social History   Socioeconomic History   Marital status: Married    Spouse name: Not on file   Number of children: Not on file   Years of education: Not on file   Highest education level: Not on  file  Occupational History   Not on file  Social Needs   Financial resource strain: Not on file   Food insecurity    Worry: Not on file    Inability: Not on file   Transportation needs    Medical: Not on file    Non-medical: Not on file  Tobacco Use   Smoking status: Never Smoker   Smokeless tobacco: Never Used  Substance and Sexual Activity   Alcohol use: Not on file   Drug use: Not on file   Sexual activity: Never  Lifestyle   Physical activity    Days per week: Not on file    Minutes per session: Not on file   Stress: Not on file  Relationships   Social connections    Talks on phone: Not on file    Gets together: Not on file    Attends religious service: Not on file    Active member of club or organization: Not on file    Attends meetings of clubs or organizations: Not on file    Relationship status: Not on file   Intimate partner violence    Fear of current or ex partner: Not on file    Emotionally abused: Not on file    Physically abused: Not on file    Forced sexual activity: Not on file  Other Topics  Concern   Not on file  Social History Narrative   Daughter, Enid Derry, is HCPOA.  Patient is a nonsmoker, nondrinker.       Family History:    Family History  Problem Relation Age of Onset   Cancer Maternal Grandfather       Review of Systems    Unable to be obtained given patient's current mental status.   Physical Exam/Data    Vitals:   10/18/19 0741 10/18/19 0800 10/18/19 0801 10/18/19 0830  BP:  (!) 107/53 (!) 107/53 (!) 119/58  Pulse: (!) 38 76 76 84  Resp: _0 Temp: 97.9 F (36.6 C) 97.9 F (36.6 C) 97.9 F (36.6 C) 97.7 F (36.5 C)  TempSrc:  Bladder    SpO2: 96% 97% 97% 95%  Weight:      Height:        Intake/Output Summary (Last 24 hours) at 10/18/2019 0903 Last data filed at 10/18/2019 0759 Gross per 24 hour  Intake 3235.12 ml  Output 30 ml  Net 3205.12 ml   Filed Weights   10/17/19 1439 10/18/19 0130 10/18/19 0435  Weight: 52.2 kg 52.3 kg 52.3 kg   Body mass index is 21.09 kg/m.   General: Pleasant elderly female appearing in NAD Psych: Normal affect. Neuro: Alert and oriented X 1 (person). Moves all extremities spontaneously. HEENT: Normal  Neck: Supple without bruits or JVD. Lungs:  Resp regular and unlabored, CTA without wheezing or rales. Heart: Irregularly irregular no s3, s4, 2/6 SEM along RUSB.  Abdomen: Soft, non-tender, non-distended, BS + x 4.  Extremities: No clubbing or cyanosis. 1+ pitting edema bilaterally. DP/PT/Radials 2+ and equal bilaterally.   EKG:  The EKG was personally reviewed and demonstrates: Atrial fibrillation with slow-ventricular response, HR 35.   Labs/Studies     Relevant CV Studies:  Echocardiogram: 06/29/2019 IMPRESSIONS    1. The left ventricle has normal systolic function, with an ejection fraction of 55-60%. The cavity size was normal. There is mildly increased left ventricular wall thickness. Left ventricular diastolic function could not be evaluated secondary to  atrial fibrillation.   2.  The right ventricle has normal systolic function. The cavity was normal.  3. The mitral valve is abnormal. There is moderate mitral annular calcification present. Mitral valve regurgitation is moderate by color flow Doppler.  4. The tricuspid valve is grossly normal.  5. The aortic valve is tricuspid. Severe calcifcation of the aortic valve. Aortic valve regurgitation is mild by color flow Doppler. Moderate stenosis of the aortic valve.  6. The aorta is abnormal unless otherwise noted.  7. There is mild dilatation of the ascending aorta measuring 42 mm.  8. Normal LV systolic function; mild LVH; mildly dilated ascending aorta; heavily calcified aortic valve with moderate AS (mean gradient 20 mmHg; DI .29) and mild AI; moderate MR; mild TR.  Laboratory Data:  Chemistry Recent Labs  Lab 10/17/19 1502 10/18/19 0429  NA 137 139  K 3.6 2.8*  CL 100 105  CO2 23 21*  GLUCOSE 54* 152*  BUN 26* 26*  CREATININE 1.42* 1.37*  CALCIUM 8.4* 8.0*  GFRNONAA 32* 33*  GFRAA 37* 39*  ANIONGAP 14 13    Recent Labs  Lab 10/17/19 1502  PROT 6.7  ALBUMIN 3.4*  AST 71*  ALT 25  ALKPHOS 295*  BILITOT 0.7   Hematology Recent Labs  Lab 10/17/19 1502 10/18/19 0429  WBC 10.7* 15.5*  RBC 4.27 3.75*  HGB 12.2 10.7*  HCT 38.8 34.5*  MCV 90.9 92.0  MCH 28.6 28.5  MCHC 31.4 31.0  RDW 17.4* 17.8*  PLT 347 392   Cardiac EnzymesNo results for input(s): TROPONINI in the last 168 hours. No results for input(s): TROPIPOC in the last 168 hours.  BNPNo results for input(s): BNP, PROBNP in the last 168 hours.  DDimer No results for input(s): DDIMER in the last 168 hours.  Radiology/Studies:  Ct Head Wo Contrast  Result Date: 10/17/2019 CLINICAL DATA:  Altered mental status EXAM: CT HEAD WITHOUT CONTRAST TECHNIQUE: Contiguous axial images were obtained from the base of the skull through the vertex without intravenous contrast. COMPARISON:  06/28/2019 FINDINGS: Brain: There is atrophy and chronic  small vessel disease changes. No acute intracranial abnormality. Specifically, no hemorrhage, hydrocephalus, mass lesion, acute infarction, or significant intracranial injury. Vascular: No hyperdense vessel or unexpected calcification. Skull: No acute calvarial abnormality. Sinuses/Orbits: Visualized paranasal sinuses and mastoids clear. Orbital soft tissues unremarkable. Other: None IMPRESSION: Atrophy, chronic microvascular disease. No acute intracranial abnormality. Electronically Signed   By: Rolm Baptise M.D.   On: 10/17/2019 21:35   Dg Chest Port 1 View  Result Date: 10/17/2019 CLINICAL DATA:  Hypotension EXAM: PORTABLE CHEST 1 VIEW COMPARISON:  06/28/2019 chest radiograph. FINDINGS: Right rotated chest radiograph. Pacer pad overlies the left chest. Surgical clips overlie the right axilla. Stable cardiomediastinal silhouette with top-normal heart size. No pneumothorax. No pleural effusion. Lungs appear clear, with no acute consolidative airspace disease and no pulmonary edema. Vertebroplasty material overlies a lower thoracic vertebral compression fracture. IMPRESSION: Right rotated chest radiograph with no active cardiopulmonary disease. Electronically Signed   By: Ilona Sorrel M.D.   On: 10/17/2019 15:38     Assessment & Plan    1. Bradycardia likely secondary to Digoxin Toxicity - presented with worsening confusion and HR in the 30's. Was discharged from her last admission on Cardizem CD 240 mg daily, Lopressor 50 mg twice daily and Digoxin 0.51m daily and it is unclear if a Dig Level was checked in the interim as she did not follow-up with Cardiology as an outpatient and no labs are available for review in  Epic since hospital discharge. - Dig Level 2.8 on admission and she received Atropine 0.81m x3. She also received Digifab and was ultimately placed on Epinephrine which has been weaned and discontinued.  - All of her PTA AV nodal blocking agents remain held. Will repeat a 12-Lead EKG this  AM along with rechecking Dig Level. Would not recommend restarting Digoxin at the time of discharge in this 85yo patient.   2. Persistent Atrial Fibrillation - diagnosed during her admission in 06/2019. While she presented with bradycardia this admission, suspect this was secondary to Digoxin toxicity and not specifically tachy-brady syndrome. Rates well-controlled in the 70's to 80's at this time.  - was on Cardizem CD 24109mdaily and Lopressor 5041mID prior to admission. Will monitor rates on telemetry and add back as indicated.  - on Eliquis 2.5mg20mD prior to admission. Resume once cleared to take PO medications.   3. Ventricular Ectopy - having frequent bigeminy on telemetry. Mg 2.1. K+ 2.8 this AM. Will order supplementation (not currently taking PO's so will need IV replacement).   4. Possible Urosepsis - Lactic Acid 2.6 on admission and UA concerning for UTI with culture pending. Received judicious IVF on admission. She does have pitting edema on examination and will likely require diuresis prior to discharge once her clinical picture improves.  - remains on Vancomycin and Cefepime. Further management per admitting team.   5. Aortic Stenosis - moderate by echo in 06/2019. Continue to follow as an outpatient.   For questions or updates, please contact [facility] consult [email or web address] for contact info under Cardiology/STEMI.  Signed, BritErma Heritage-C 10/18/2019, 9:03 AM Pager: [phone] note Patient seen and discussed with PA StraAhmed Primaagree with her documentation above. 89 y24female history of afib and HTN admitted with bradycardia and hypotension. In ER HRs in the 30s, SBP in 70s. Given atropine in ER, started on epi drip and given digfab.     K 3.6 Cr 1.42 BUN 26 AST 71 Alk phos 295 WBC 10.7 Plt 347 Digoxin level 2.8 IN 1.5  COVID neg Lactic acid 2. 6 TSH 3.1 Mg 2.1  hstrop 154-->136 EKG afib slow response in 30s CXR no acute process CT  head no acute process   Bradycardia in setting of multiple av nodal agents at home for her afib with cardizem, digoxin, metoprol. Hold all av nodal agents and follow rates. Previously received atropine and digifab. Transiently on epi drip now off, would favor dobutamine if recurrent bradycardia with relateively stable bp's or if significantly hypotensive again dopamine as opposed to epi given the mixed hemodynamic profiles of epi depending on the dosing.   Her initial afib was diagnsoed in the setting of her femoral neck fracture and surgery and led to the need for multiple av nodal agents for rate control.  Currently afib rates 80s off epi drip, follow at this time and continue to hold av nodal agents  Hypokalemia receiving IV replacement today  Leukocytosis per primary team, she is on broad spectrum abx.  AKI in setting of hypotension, potential sepsis. Per primary team  Valvular heart disease with mod MR and mod AS by 06/2019. No acute issues. Her reported parameters are AV VTI 0.9, mean grad 16, dimensionless index 0.29, LVEF 55-60%. LV SV index is 26, I suspect the lower SVI is leading to the lower than expected gradient.  Collection or parameters would suggest moderate/boderline severe AS. Will need to be followed over time depending  on her baseline functional status at follow up.   Carlyle Dolly MD

## 2019-10-19 DIAGNOSIS — F0390 Unspecified dementia without behavioral disturbance: Secondary | ICD-10-CM

## 2019-10-19 DIAGNOSIS — Z7189 Other specified counseling: Secondary | ICD-10-CM

## 2019-10-19 DIAGNOSIS — G8929 Other chronic pain: Secondary | ICD-10-CM

## 2019-10-19 DIAGNOSIS — I4821 Permanent atrial fibrillation: Secondary | ICD-10-CM

## 2019-10-19 DIAGNOSIS — M549 Dorsalgia, unspecified: Secondary | ICD-10-CM

## 2019-10-19 DIAGNOSIS — Z515 Encounter for palliative care: Secondary | ICD-10-CM

## 2019-10-19 LAB — GLUCOSE, CAPILLARY
Glucose-Capillary: 85 mg/dL (ref 70–99)
Glucose-Capillary: 82 mg/dL (ref 70–99)

## 2019-10-19 LAB — CBC
HCT: 37.1 % (ref 36.0–46.0)
Hemoglobin: 12 g/dL (ref 12.0–15.0)
MCH: 28.7 pg (ref 26.0–34.0)
MCHC: 32.3 g/dL (ref 30.0–36.0)
MCV: 88.8 fL (ref 80.0–100.0)
Platelets: 273 10*3/uL (ref 150–400)
RBC: 4.18 MIL/uL (ref 3.87–5.11)
RDW: 18.4 % — ABNORMAL HIGH (ref 11.5–15.5)
WBC: 6.1 10*3/uL (ref 4.0–10.5)
nRBC: 0 % (ref 0.0–0.2)

## 2019-10-19 LAB — URINE CULTURE: Culture: NO GROWTH

## 2019-10-19 LAB — BASIC METABOLIC PANEL
Anion gap: 11 (ref 5–15)
BUN: 19 mg/dL (ref 8–23)
CO2: 22 mmol/L (ref 22–32)
Calcium: 8.2 mg/dL — ABNORMAL LOW (ref 8.9–10.3)
Chloride: 108 mmol/L (ref 98–111)
Creatinine, Ser: 0.78 mg/dL (ref 0.44–1.00)
GFR calc Af Amer: >60 mL/min (ref >60)
GFR calc non Af Amer: >60 mL/min (ref >60)
Glucose, Bld: 90 mg/dL (ref 70–99)
Potassium: 3.5 mmol/L (ref 3.5–5.1)
Sodium: 141 mmol/L (ref 135–145)

## 2019-10-19 LAB — DIGOXIN LEVEL: Digoxin Level: 1.8 ng/mL (ref 0.8–2.0)

## 2019-10-19 LAB — PROCALCITONIN: Procalcitonin: 0.3 ng/mL

## 2019-10-19 MED ORDER — TIZANIDINE HCL 4 MG PO TABS
4.0000 mg | ORAL_TABLET | Freq: Four times a day (QID) | ORAL | Status: DC | PRN
  Administered 2019-10-19 – 2019-10-20 (×2): 4 mg via ORAL
  Filled 2019-10-19: qty 2
  Filled 2019-10-19: qty 1

## 2019-10-19 MED ORDER — DOCUSATE SODIUM 100 MG PO CAPS: 100.0000 mg | ORAL_CAPSULE | Freq: Two times a day (BID) | ORAL | Status: DC | PRN

## 2019-10-19 MED ORDER — OXYCODONE HCL ER 10 MG PO T12A
10.0000 mg | EXTENDED_RELEASE_TABLET | Freq: Two times a day (BID) | ORAL | Status: DC
  Administered 2019-10-19 – 2019-10-20 (×3): 10 mg via ORAL
  Filled 2019-10-19 (×3): qty 1

## 2019-10-19 MED ORDER — APIXABAN 2.5 MG PO TABS
2.5000 mg | ORAL_TABLET | Freq: Two times a day (BID) | ORAL | Status: DC
  Administered 2019-10-19 – 2019-10-20 (×3): 2.5 mg via ORAL
  Filled 2019-10-19 (×3): qty 1

## 2019-10-19 MED ORDER — DILTIAZEM HCL 25 MG/5ML IV SOLN: 10.0000 mg | Freq: Once | INTRAVENOUS | Status: DC

## 2019-10-19 MED ORDER — METOPROLOL TARTRATE 5 MG/5ML IV SOLN
5.0000 mg | Freq: Four times a day (QID) | INTRAVENOUS | Status: DC | PRN
  Administered 2019-10-19: 5 mg via INTRAVENOUS
  Filled 2019-10-19 (×2): qty 5

## 2019-10-19 MED ORDER — OXYCODONE HCL ER 15 MG PO T12A: 15.0000 mg | EXTENDED_RELEASE_TABLET | Freq: Two times a day (BID) | ORAL | Status: DC

## 2019-10-19 NOTE — Evaluation (Addendum)
Physical Therapy Evaluation Patient Details Name: Betty Carpenter MRN: [id number] DOB: 12-16-1924 Today's Date: 10/19/2019   History of Present Illness  Betty Carpenter is a 85 y.o. female with medical history significant for DVT, hypertension, atrial fibrillation with RVR.  Patient was brought to the ED from [facility] with reports of bradycardia and hypotension, with confusion.  Heart rate was in the 30s, blood pressure 99/48.  Patient at that time was alert and oriented x4, complaining of back pain.  She subsequently became agitated and had to be given Geodon.  On my evaluation patient is awake but appears sedated, not responding verbally or to directions.Patient's daughter Betty Carpenter tells me that when she saw patients 4 days ago, it was like patient was drifting away while she was talking to her, this is not unusual for her.  Patient had no complaints, but has chronic poor p.o. intake.  She has been complaining of the same back pain for 30 to 40 years and is on pain medication for this.  At baseline patient ambulates with a wheelchair, needs assistance with most activities of daily living, recognize family but cannot hold a conversation.    Clinical Impression  Pt initially difficult to arouse, but able to follow commands once alert. Pt disoriented to year and location. NP, RN and nurse tech in room at beginning of evaluation to arouse pt and assist in cleaning pt up. Pt requires max assist for rolling in bed to change soiled bedding, attempts to reach for bedside rails, but able to maintain grip to assist in rolling. Pt able to maintain static sitting at EOB for 5 min progress to contact guard assist, dropping to R elbow/forearm with fatigue. Pt attempts STS reps with and without AD, requiring max assist for both trials, maintains BLE valgus posture in standing, max assist to maintain for <10 sec before therapist assists pt in controlled lowering to sitting at EOB. Pt unable to perform stand pivot or  squat pivot transfer to bedside chair due to fatigue. Pt c/o back pain with mobility and facial wincing and moaning noted, but able to tolerate with biggest complaint being "cold". Pt left in bed with 2 pillows behind head due to neck extension posture, call bell in lap, and nurse notified of session and continued therapy services. PLOF obtained from chart review due to pt's inability to answer questions consistently. Pt will benefit from continued physical therapy in hospital and recommended venue below to increase strength, balance, endurance for safe ADLs and gait.     Follow Up Recommendations Supervision/Assistance - 24 hour;SNF    Equipment Recommendations  None recommended by PT    Recommendations for Other Services OT consult;Speech consult     Precautions / Restrictions Precautions Precautions: Fall Restrictions Weight Bearing Restrictions: No      Mobility  Bed Mobility Overal bed mobility: Needs Assistance Bed Mobility: Rolling;Supine to Sit;Sit to Supine Rolling: Max assist   Supine to sit: Max assist Sit to supine: Max assist   General bed mobility comments: Pt reaches for bedrails, but weak grip strength prevents pt from using bedrails to assist in bed mobility. Pt requires trunk uprighting assistance and able to slide BLE to EOB when coming to sitting. Pt requires assistance to lift BLE back into bed when lying down.  Transfers Overall transfer level: Needs assistance Equipment used: Rolling walker (2 wheeled);1 person hand held assist Transfers: Sit to/from Stand Sit to Stand: Max assist         General  transfer comment: 1 attempt with RW and 1 attempt with bil hand held assist, max assist for both trials, hand placement for safety and constant verbal cues, maintains BLE valgus once standing  Ambulation/Gait             General Gait Details: not attempted  Stairs            Wheelchair Mobility    Modified Rankin (Stroke Patients Only)        Balance Overall balance assessment: Needs assistance Sitting-balance support: Feet supported;Bilateral upper extremity supported Sitting balance-Leahy Scale: Fair Sitting balance - Comments: mod assist initially to maintain static sitting posture, progressing to contact guard assist, props on R elbow with lateral lean with fatigue Postural control: Right lateral lean Standing balance support: During functional activity;Bilateral upper extremity supported Standing balance-Leahy Scale: Poor Standing balance comment: max assist to maintain static standing for <10 sec before requiring return to sitting due to fatigue and poor balance                             Pertinent Vitals/Pain Pain Assessment: Faces Faces Pain Scale: Hurts little more Pain Location: back Pain Descriptors / Indicators: Moaning Pain Intervention(s): Limited activity within patient's tolerance;Monitored during session;Repositioned    Home Living Family/patient expects to be discharged to:: Assisted living               Home Equipment: Walker - 2 wheels;Wheelchair - manual;Cane - single point;Hospital bed Additional Comments: per chart review, pt is a resident from [facility] in [place], [place]    Prior Function Level of Independence: Needs assistance   Gait / Transfers Assistance Needed: per chart review, pt uses WC for mobility  ADL's / Homemaking Assistance Needed: per chart review, pt requires assistance with ADLs        Hand Dominance        Extremity/Trunk Assessment   Upper Extremity Assessment Upper Extremity Assessment: Generalized weakness    Lower Extremity Assessment Lower Extremity Assessment: Generalized weakness(redness and tenderness along L anterior shin) RLE Deficits / Details: full AROM in knee, ankle, and hip flexion LLE Deficits / Details: full AROM in knee, ankle, and hip flexion    Cervical / Trunk Assessment Cervical / Trunk Assessment: Kyphotic   Communication   Communication: HOH  Cognition Arousal/Alertness: Lethargic Behavior During Therapy: Flat affect                                   General Comments: Pt lethargic, but arouses to sound and requires questions repeated to maintain arousal. 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 Pt disoriented to location. Pt oriented to self and birthday.      General Comments      Exercises     Assessment/Plan    PT Assessment Patient needs continued PT services  PT Problem List Decreased strength;Decreased range of motion;Decreased activity tolerance;Decreased balance;Decreased mobility;Pain       PT Treatment Interventions DME instruction;Gait training;Functional mobility training;Therapeutic activities;Therapeutic exercise;Balance training;Neuromuscular re-education;Patient/family education;Wheelchair mobility training;Manual techniques    PT Goals (Current goals can be found in the Care Plan section)  Acute Rehab PT Goals Patient Stated Goal: return to [facility] with assistance PT Goal Formulation: With patient Time For Goal Achievement: 11-24-19 Potential to Achieve Goals: Fair  Frequency Min 2X/week   Barriers to discharge        Co-evaluation               AM-PAC PT "6 Clicks" Mobility  Outcome Measure Help needed turning from your back to your side while in a flat bed without using bedrails?: A Lot Help needed moving from lying on your back to sitting on the side of a flat bed without using bedrails?: A Lot Help needed moving to and from a bed to a chair (including a wheelchair)?: Total Help needed standing up from a chair using your arms (e.g., wheelchair or bedside chair)?: A Lot Help needed to walk in hospital room?: Total Help needed climbing 3-5 steps with a railing? : Total 6 Click Score: 9    End of Session Equipment Utilized During Treatment: Gait  belt Activity Tolerance: Patient tolerated treatment well;Patient limited by fatigue;Patient limited by lethargy Patient left: in bed;with call bell/phone within reach Nurse Communication: Mobility status PT Visit Diagnosis: Unsteadiness on feet (R26.81);Other abnormalities of gait and mobility (R26.89);Muscle weakness (generalized) (M62.81)    Time: [id number] PT Time Calculation (min) (ACUTE ONLY): 41 min   Charges:   PT Evaluation $PT Eval Moderate Complexity: 1 Mod PT Treatments $Therapeutic Activity: 23-37 mins         Tori Yardley Lekas PT, DPT 10/19/19, 1:07 PM [phone]

## 2019-10-19 NOTE — TOC Initial Note (Addendum)
Transition of Care [facility]) - Initial/Assessment Note    Patient Details  Name: Betty Carpenter MRN: [id number] Date of Birth: 1925/08/05  [facility]) CM/SW Contact:    Zanna Hawn, Chauncey Reading, RN Phone Number: 10/19/2019, 3:21 PM  Clinical Narrative:         Patient from [facility]. Consult received for return to [facility] with hospice services. Left message with Cassandra with [facility] [employer], uploaded clinicals to hub.   Discussed with Juliann Pulse of [facility], anticipate patient will need hospital bed.  Per Juliann Pulse, if patient returns tomorrow, covid test from 11/30 will be sufficient.       Discussed plan with daughter, [name] also.    Expected Discharge Plan: Assisted Living Barriers to Discharge: Continued Medical Work up       Expected Discharge Plan and Services Expected Discharge Plan: Assisted Living   Discharge Planning Services: CM Consult Post Acute Care Choice: Hospice Living arrangements for the past 2 months: [place]: [facility]        Prior Living Arrangements/Services Living arrangements for the past 2 months: [place]                     Activities of Daily Living Home Assistive Devices/Equipment: Wheelchair ADL Screening (condition at time of admission) Patient's cognitive ability adequate to safely complete daily activities?: No Is the patient deaf or have difficulty hearing?: No Does the patient have difficulty seeing, even when wearing glasses/contacts?: No Does the patient have difficulty concentrating, remembering, or making decisions?: Yes Patient able to express need for assistance with ADLs?: No Does the patient have difficulty dressing or bathing?: Yes Independently performs ADLs?: No Communication: Independent Dressing (OT): Needs assistance Is this a change from baseline?: Pre-admission baseline Grooming: Needs  assistance Is this a change from baseline?: Pre-admission baseline Feeding: Independent Bathing: Needs assistance Is this a change from baseline?: Pre-admission baseline Toileting: Needs assistance Is this a change from baseline?: Pre-admission baseline In/Out Bed: Needs assistance Is this a change from baseline?: Pre-admission baseline Walks in Home: Independent with device (comment), Needs assistance Is this a change from baseline?: Pre-admission baseline Does the patient have difficulty walking or climbing stairs?: Yes(wheelchair bound since hip fracture) Weakness of Legs: Both Weakness of Arms/Hands: None  Permission Sought/Granted                  Emotional Assessment              Admission diagnosis:  Symptomatic bradycardia [R00.1] Poisoning by digoxin, accidental or unintentional, initial encounter [T46.0X1A] Patient Active Problem List   Diagnosis Date Noted  . Bradycardia 10/17/2019  . Malnutrition of moderate degree 07/01/2019  . Closed displaced fracture of left femoral neck ([place]) 06/28/2019  . Atrial fibrillation with rapid ventricular response ([place]) 06/28/2019  . DDD (degenerative disc disease), lumbar 06/15/2019  . HTN (hypertension) 06/15/2019  . Hx: UTI (urinary tract infection) 06/15/2019  . Lumbar spinal stenosis 06/15/2019  . Lumbar spondylolysis 06/15/2019  . Osteoarthrosis 06/15/2019  . Venous ulcer of ankle, right ([place]) 04/07/2019  . GERD without esophagitis 03/23/2019  . Constipation due to opioid therapy 03/23/2019  . Vitamin D deficiency 03/23/2019  . S/P right hip fracture with cannulated screw placement 03/18/19   . Anxiety   .  Pressure injury of skin 03/18/2019  . Closed right hip fracture ([place]) 03/17/2019  . Sinus tachycardia 03/17/2019  . Arterial leg ulcer ([place]) 11/02/2018  . Lymphedema 11/02/2018  . Closed compression fracture of thoracic vertebra ([place]) 03/15/2018  . History of breast cancer 10/23/2017  . History of DVT (deep  vein thrombosis) 10/23/2017  . Chronic back pain 10/23/2017  . Benign essential HTN 10/23/2017  . Hypertensive urgency 10/23/2017  . Chronic, continuous use of opioids 10/23/2017  . Anemia 08/18/2016  . Chronic fatigue 05/13/2016  . Pernicious anemia 05/13/2016  . Dyslipidemia 01/15/2016  . History of right mastectomy 09/17/2015  . Conjunctival hemorrhage, left eye 02/13/2015  . Mixed incontinence 11/28/2014  . Breast cancer, stage 1 ([place]) 01/28/2012   PCP:  Terald Sleeper, PA-C Pharmacy:   [facility] - [address] [place], [place] [address] Phone: [phone] Fax: [phone]  [place], [place] - [address] [address] Phone: [phone] Fax: [phone]     Social Determinants of Health (SDOH) Interventions    Readmission Risk Interventions Readmission Risk Prevention Plan 10/19/2019 03/22/2019  Transportation Screening Complete Complete  PCP or Specialist Appt within 5-7 Days - Complete  Home Care Screening - Complete  Medication Review (RN CM) - Complete  Medication Review ([place]) Complete -  PCP or Specialist appointment within 3-5 days of discharge Not Complete -  PCP/Specialist Appt Not Complete comments return to ALF with hospice care -  [place] or Home Care Consult Complete -  SW Recovery Care/Counseling Consult Complete -  Palliative Care Screening Complete -  Skilled Nursing Facility Complete -  Some recent data might be hidden

## 2019-10-19 NOTE — Care Management (Signed)
SARS Coronavirus 2 by RT PCR (hospital order, performed in [place] hospital lab) Nasopharyngeal Nasopharyngeal Swab Order: [id number] Status:  Final result Visible to patient:  No (not released) Next appt:  10/31/2019 at 10:40 AM in Family Medicine Terald Sleeper, [place]) Specimen Information: Nasopharyngeal Swab      Ref Range & Units 2d ago (10/17/19) 46mo ago (07/18/19) 86mo ago (06/28/19) 33mo ago (03/28/19)  SARS Coronavirus 2 NEGATIVE NEGATIVE  NEGATIVE CM  NEGATIVE CM  NEGATIVE CM   Comment: (NOTE)  SARS-CoV-2 target nucleic acids are NOT DETECTED.  The SARS-CoV-2 RNA is generally detectable in upper and lower  respiratory specimens during the acute phase of infection. The lowest  concentration of SARS-CoV-2 viral copies this assay can detect is 250  copies / mL. A negative result does not preclude SARS-CoV-2 infection  and should not be used as the sole basis for treatment or other  patient management decisions. A negative result may occur with  improper specimen collection / handling, submission of specimen other  than nasopharyngeal swab, presence of viral mutation(s) within the  areas targeted by this assay, and inadequate number of viral copies  (<250 copies / mL). A negative result must be combined with clinical  observations, patient history, and epidemiological information.  Fact Sheet for Patients:   StrictlyIdeas.no  Fact Sheet for Healthcare Providers:  BankingDealers.co.za  This test is not yet approved or clearedby the [place] FDA and  has been authorized for detection and/or diagnosis of SARS-CoV-2 by  FDA under an Emergency Use Authorization (EUA). This EUA will remain  in effect (meaning this test can be used) for the duration of the  COVID-19 declaration under Section 564(b)(1) of the Act, 21 U.S.C.  section 360bbb-3(b)(1), unless the authorization is terminated or  revoked sooner.  Performed at [facility], [address] [place], [place] [address]

## 2019-10-19 NOTE — NC FL2 (Signed)
[place] MEDICAID FL2 LEVEL OF CARE SCREENING TOOL     IDENTIFICATION  Patient Name: Betty Carpenter Birthdate: 1925-02-13 Sex: female Admission Date (Current Location): 10/17/2019  [facility] and [place] Number:  [employer] and Address:  [place] [address], [place]      Provider Number: [id number]  Attending Physician Name and Address:  Rodena Goldmann, DO  Relative Name and Phone Number:  [name] -[phone]    Current Level of Care: Hospital Recommended Level of Care: [place] Prior Approval Number: [id number]  Date Approved/Denied:   PASRR Number:    Discharge Plan: (with hospice services)    Current Diagnoses: Patient Active Problem List   Diagnosis Date Noted  . Bradycardia 10/17/2019  . Malnutrition of moderate degree 07/01/2019  . Closed displaced fracture of left femoral neck ([place]) 06/28/2019  . Atrial fibrillation with rapid ventricular response ([place]) 06/28/2019  . DDD (degenerative disc disease), lumbar 06/15/2019  . HTN (hypertension) 06/15/2019  . Hx: UTI (urinary tract infection) 06/15/2019  . Lumbar spinal stenosis 06/15/2019  . Lumbar spondylolysis 06/15/2019  . Osteoarthrosis 06/15/2019  . Venous ulcer of ankle, right ([place]) 04/07/2019  . GERD without esophagitis 03/23/2019  . Constipation due to opioid therapy 03/23/2019  . Vitamin D deficiency 03/23/2019  . S/P right hip fracture with cannulated screw placement 03/18/19   . Anxiety   . Pressure injury of skin 03/18/2019  . Closed right hip fracture ([place]) 03/17/2019  . Sinus tachycardia 03/17/2019  . Arterial leg ulcer ([place]) 11/02/2018  . Lymphedema 11/02/2018  . Closed compression fracture of thoracic vertebra ([place]) 03/15/2018  . History of breast cancer 10/23/2017  . History of DVT (deep vein thrombosis) 10/23/2017  . Chronic back pain 10/23/2017  . Benign essential HTN 10/23/2017  . Hypertensive urgency 10/23/2017  .  Chronic, continuous use of opioids 10/23/2017  . Anemia 08/18/2016  . Chronic fatigue 05/13/2016  . Pernicious anemia 05/13/2016  . Dyslipidemia 01/15/2016  . History of right mastectomy 09/17/2015  . Conjunctival hemorrhage, left eye 02/13/2015  . Mixed incontinence 11/28/2014  . Breast cancer, stage 1 ([place]) 01/28/2012    Orientation RESPIRATION BLADDER Height & Weight     Self, Time  Normal Incontinent Weight: 53.6 kg Height:  5\' 2"  (157.5 cm)  BEHAVIORAL SYMPTOMS/MOOD NEUROLOGICAL BOWEL NUTRITION STATUS      Continent Diet(soft diet)  AMBULATORY STATUS COMMUNICATION OF NEEDS Skin   Extensive Assist Verbally Normal                       Personal Care Assistance Level of Assistance  Bathing, Dressing, Feeding Bathing Assistance: Maximum assistance Feeding assistance: Limited assistance Dressing Assistance: Maximum assistance     Functional Limitations Info  Sight, Hearing, Speech Sight Info: Adequate Hearing Info: Adequate Speech Info: Adequate    SPECIAL CARE FACTORS FREQUENCY                       Contractures      Additional Factors Info  Code Status, Allergies Code Status Info: DNR Allergies Info: Cymbalta           Current Medications (10/19/2019):  This is the current hospital active medication list Current Facility-Administered Medications  Medication Dose Route Frequency Provider Last Rate Last Dose  . acetaminophen (TYLENOL) tablet 650 mg  650 mg Oral Q6H PRN Emokpae, Ejiroghene E, MD       Or  .  acetaminophen (TYLENOL) suppository 650 mg  650 mg Rectal Q6H PRN Emokpae, Ejiroghene E, MD      . apixaban (ELIQUIS) tablet 2.5 mg  2.5 mg Oral BID Manuella Ghazi, Pratik D, DO   2.5 mg at 10/19/19 0806  . Chlorhexidine Gluconate Cloth 2 % PADS 6 each  6 each Topical Daily Emokpae, Ejiroghene E, MD   6 each at 10/19/19 0806  . dextrose 5 % with KCl 20 mEq / L  infusion  20 mEq Intravenous Continuous Reubin Milan, MD 50 mL/hr at 10/19/19 0300    .  docusate sodium (COLACE) capsule 100 mg  100 mg Oral BID PRN Basilio Cairo, NP      . fentaNYL (SUBLIMAZE) injection 50 mcg  50 mcg Intravenous Q2H PRN Reubin Milan, MD   50 mcg at 10/19/19 0206  . MEDLINE mouth rinse  15 mL Mouth Rinse BID Emokpae, Ejiroghene E, MD   15 mL at 10/19/19 0806  . metoprolol tartrate (LOPRESSOR) injection 5 mg  5 mg Intravenous Q6H PRN Reubin Milan, MD   5 mg at 10/19/19 0610  . ondansetron (ZOFRAN) tablet 4 mg  4 mg Oral Q6H PRN Emokpae, Ejiroghene E, MD       Or  . ondansetron (ZOFRAN) injection 4 mg  4 mg Intravenous Q6H PRN Emokpae, Ejiroghene E, MD      . oxyCODONE (OXYCONTIN) 12 hr tablet 10 mg  10 mg Oral Q12H Shah, Pratik D, DO   10 mg at 10/19/19 0805  . tiZANidine (ZANAFLEX) tablet 4 mg  4 mg Oral Q6H PRN Heath Lark D, DO   4 mg at 10/19/19 0805     Discharge Medications: Please see discharge summary for a list of discharge medications.  Relevant Imaging Results:  Relevant Lab Results:   Additional Information SSN [id number]  Alvetta Hidrogo, Chauncey Reading, RN

## 2019-10-19 NOTE — Plan of Care (Signed)
  Problem: Acute Rehab PT Goals(only PT should resolve) Goal: Pt will Roll Supine to Side Outcome: Progressing Flowsheets (Taken 10/19/2019 1308) Pt will Roll Supine to Side: with min assist Goal: Pt Will Go Supine/Side To Sit Outcome: Progressing Flowsheets (Taken 10/19/2019 1308) Pt will go Supine/Side to Sit: with minimal assist Goal: Pt Will Go Sit To Supine/Side Outcome: Progressing Flowsheets (Taken 10/19/2019 1308) Pt will go Sit to Supine/Side: with minimal assist Goal: Patient Will Transfer Sit To/From Stand Outcome: Progressing Flowsheets (Taken 10/19/2019 1308) Patient will transfer sit to/from stand: with moderate assist Goal: Pt Will Transfer Bed To Chair/Chair To Bed Outcome: Progressing Flowsheets (Taken 10/19/2019 1308) Pt will Transfer Bed to Chair/Chair to Bed: with mod assist   Tori Kipp Shank PT, DPT 10/19/19, 1:10 PM [phone]

## 2019-10-19 NOTE — Progress Notes (Signed)
   Progress Note  Patient Name: Betty Carpenter Date of Encounter: 10/19/2019  Primary Cardiologist: Pixie Casino, MD  Please see full cardiology consultation note from yesterday.  I reviewed interval progress and spoke with nursing.  Overnight patient became more consistently tachycardic in atrial fibrillation and complaining of pain (no longer on oxycodone, metoprolol, diltiazem CD, or Lanoxin due to presentation).  She was seen by [facility] and given fentanyl, also started on Lopressor IV as needed for heart rate control.  This morning she appears more calm, heart rate is in the 90s in atrial fibrillation by telemetry which I personally reviewed.  She has had no bradycardia or heart block overnight.  Lab work shows potassium 3.5, creatinine 0.78, hemoglobin 12.0, platelets 273.  Repeat ECG and Lanoxin level this morning.  Agree with as needed use of IV Lopressor for the time being.  I expect she will however need to resume oral AV nodal blockers eventually - can start low and uptitrate as needed.  Signed, Rozann Lesches, MD  10/19/2019, 8:50 AM

## 2019-10-19 NOTE — Progress Notes (Signed)
Patient is to be transferred to a telemetry bed and in stable condition. Daughter made aware and agreeable. Report given to accepting RN. Patient to be transported by staff via bed to room 330.  Celestia Khat, RN

## 2019-10-19 NOTE — Consult Note (Signed)
Consultation Note Date: 10/19/2019   Patient Name: Betty Carpenter  DOB: 05-03-1925  MRN: [id number]  Age / Sex: 85 y.o., female  PCP: Betty Sleeper, PA-C Referring Physician: Rodena Goldmann, DO  Reason for Consultation: Establishing goals of care  HPI/Patient Profile: 85 y.o. female  with past medical history of dementia, afib, DVT on eliquis, HTN, chronic back pain, arthritis, falls s/p right hip pinning 03/2019 and left hip hemi arthroplasty 06/2019 admitted on 10/17/2019 with bradycardia, hypotension, and confusion. In ED, HR 30's and blood pressure down to 72/37. Hospital admission for bradycardia likely secondary to digoxin toxicity. Cardiology following with recommendation not to re-start digoxin and eventually resume oral AV nodal blockers. Palliative medicine consultation for goals of care.   Clinical Assessment and Goals of Care:  I have reviewed medical records, discussed with RN and Dr. Manuella Carpenter, and assessed the patient at bedside. Betty Carpenter is drowsy and about to be cleaned up by nursing staff at bedside. PT also at bedside with plans to attempt therapy after she is cleaned. No family at bedside.   Spoke with daughter, Betty Carpenter via telephone to discuss diagnosis, prognosis, [place], EOL wishes, disposition and options.  Introduced Palliative Medicine as specialized medical care for people living with serious illness. It focuses on providing relief from the symptoms and stress of a serious illness. The goal is to improve quality of life for both the patient and the family.  We discussed a brief life review of the patient. Prior to admission, patient living at [facility]. Betty Carpenter reports worsening dementia in the last year and falls requiring hip operations in May and August 2020. Betty Carpenter is now wheelchair bound. Appetite has been poor and she spends majority of Betty day sleeping.   Betty Carpenter shares that Betty  father (the patient's spouse of 73 years!) died at [facility] three weeks ago. Betty Carpenter does not remember this and frequently asks Betty Carpenter about Betty father and believes he is still alive and living with [place]. Betty Carpenter believes Betty Carpenter's dementia has gotten even worse since father passed.  Betty Carpenter reports [name] has been on Oxycontin for numerous years with chronic back pain for 20-30 years. This medication never made Betty sleepy until approximately the last 6 months.   Discussed events leading up to admission and course of hospitalization including diagnoses, interventions, plan of care.   I attempted to elicit values and goals of care important to the patient. Advanced directives, concepts specific to code status, artifical feeding and hydration, and rehospitalization were considered and discussed. Betty Carpenter is very clear on Betty Carpenter's wishes against life-prolonging interventions. The patient has told Betty Carpenter "When my heart quits, I don't want to be brought back" and "don't want tubes/ventilator." Betty Carpenter confirms Betty Carpenter's wishes for DNR/DNI and NO feeding tube.   Betty Carpenter further shares Betty belief that Betty Carpenter "wants to be with daddy" and "I want Betty to be comfortable until she comes home to be with daddy." Betty Carpenter's main goals for Betty Carpenter are comfort and pain relief. "  I don't want Betty to suffer anymore."  Discussed physical therapy. Betty Carpenter states "another form of torture" if we pushed physical therapy, with Betty Carpenter's underlying chronic pain and declining functional status.   The difference between aggressive medical intervention and comfort care was considered in light of the patient's goals of care. Introduced hospice philosophy and options, explaining role of comfort, quality of life, nature taking course, dignity at EOL. Also focus on symptom management and preventing re-hospitalization. Betty Carpenter does not believe Betty Carpenter would wish to be recurrently hospitalized. Betty Carpenter is ready to  initiate hospice services at [facility] when Betty Carpenter discharges.   Discussed medical management and optimization inpatient with plans to discharge back to ALF. Explained that SW will be in contact with Betty.   Questions and concerns were addressed. PMT contact information given.     SUMMARY OF RECOMMENDATIONS    DNR/DNI, NO feeding tube. Daughter confirms Betty Carpenter's wishes against life-prolonging interventions.  Patient's health has continued to decline with progression of dementia since death of Betty husband 3 weeks ago. Daughter's goals are comfort and pain relief. Daughter is ready to start hospice services at ALF on discharge. SW notified.   Continue medical management and medically maximize with plan to discharge to ALF with hospice.  Code Status/Advance Care Planning:  DNR  Symptom Management:   Oxycontin 10mg  q12h chronic pain  Colace 100mg  PO BID prn constipation  Zanaflex 4mg  PO q6h prn muscle spasms  Palliative Prophylaxis:   Aspiration, Bowel Regimen, Delirium Protocol, Frequent Pain Assessment, Oral Care and Turn Reposition  Psycho-social/Spiritual:   Desire for further Chaplaincy support:yes  Additional Recommendations: Caregiving  Support/Resources and Education on Hospice  Prognosis:   Poor long-term prognosis likely less than six months if not months with progression of dementia and declining cognitive/functional/nutritional status. Daughter wishes for comfort measures.   Discharge Planning: ALF with hospice services     Primary Diagnoses: Present on Admission: **None**   I have reviewed the medical record, interviewed the patient and family, and examined the patient. The following aspects are pertinent.  Past Medical History:  Diagnosis Date  . Arthritis   . Atrial fibrillation ([place])   . Chronic back pain   . Closed wedge compression fracture of L1 vertebra ([place]) 2004  . Hypertension   . Sciatica    Social History   Socioeconomic  History  . Marital status: Married    Spouse name: Not on file  . Number of children: Not on file  . Years of education: Not on file  . Highest education level: Not on file  Occupational History  . Not on file  Social Needs  . Financial resource strain: Not on file  . Food insecurity    Worry: Not on file    Inability: Not on file  . Transportation needs    Medical: Not on file    Non-medical: Not on file  Tobacco Use  . Smoking status: Never Smoker  . Smokeless tobacco: Never Used  Substance and Sexual Activity  . Alcohol use: Not on file  . Drug use: Not on file  . Sexual activity: Never  Lifestyle  . Physical activity    Days per week: Not on file    Minutes per session: Not on file  . Stress: Not on file  Relationships  . Social Herbalist on phone: Not on file    Gets together: Not on file    Attends religious service: Not on file  Active member of club or organization: Not on file    Attends meetings of clubs or organizations: Not on file    Relationship status: Not on file  Other Topics Concern  . Not on file  Social History Narrative   Daughter, Betty Carpenter, is HCPOA.  Patient is a nonsmoker, nondrinker.     Family History  Problem Relation Age of Onset  . Cancer Maternal Grandfather    Scheduled Meds: . apixaban  2.5 mg Oral BID  . Chlorhexidine Gluconate Cloth  6 each Topical Daily  . mouth rinse  15 mL Mouth Rinse BID  . oxyCODONE  10 mg Oral Q12H   Continuous Infusions: . dextrose 5 % with KCl 20 mEq / L 50 mL/hr at 10/19/19 0300   PRN Meds:.acetaminophen **OR** acetaminophen, docusate sodium, fentaNYL (SUBLIMAZE) injection, metoprolol tartrate, ondansetron **OR** ondansetron (ZOFRAN) IV, tiZANidine Medications Prior to Admission:  Prior to Admission medications   Medication Sig Start Date End Date Taking? Authorizing Provider  acetaminophen (TYLENOL) 325 MG tablet Take 650 mg by mouth 4 (four) times daily.   Yes [provider]   amLODipine (NORVASC) 5 MG tablet Take 5 mg by mouth daily.   Yes [provider]  apixaban (ELIQUIS) 2.5 MG TABS tablet Take 2.5 mg by mouth 2 (two) times daily.    Yes [provider]  digoxin (LANOXIN) 0.25 MG tablet Take 1 tablet (0.25 mg total) by mouth daily. 07/12/19  Yes Alma Friendly, MD  diltiazem (CARDIZEM CD) 240 MG 24 hr capsule Take 1 capsule (240 mg total) by mouth daily. 07/12/19  Yes Alma Friendly, MD  docusate sodium (COLACE) 100 MG capsule Take 100 mg by mouth 2 (two) times daily as needed for constipation.    Yes [provider]  loperamide (IMODIUM A-D) 2 MG tablet Take 2 mg by mouth 4 (four) times daily as needed for diarrhea or loose stools.   Yes [provider]  lubiprostone (AMITIZA) 24 MCG capsule Take 24 mcg by mouth 2 (two) times daily.   Yes [provider]  metoprolol tartrate (LOPRESSOR) 50 MG tablet Take 1 tablet (50 mg total) by mouth 2 (two) times daily. 07/11/19  Yes Alma Friendly, MD  Multiple Vitamins-Minerals (MULTIVITAMIN WITH MINERALS) tablet Take 1 tablet by mouth daily.   Yes [provider]  ondansetron (ZOFRAN-ODT) 4 MG disintegrating tablet Take 4 mg by mouth every 6 (six) hours as needed for nausea or vomiting.  07/23/17  Yes [provider]  oxyCODONE (OXYCONTIN) 15 mg 12 hr tablet Take 1 tablet (15 mg total) by mouth every 12 (twelve) hours. 09/28/19  Yes Betty Sleeper, PA-C  pantoprazole (PROTONIX) 20 MG tablet Take 1 tablet (20 mg total) by mouth daily. 03/22/19 03/21/20 Yes Barton Dubois, MD  tiZANidine (ZANAFLEX) 4 MG tablet Take 4 mg by mouth every 6 (six) hours as needed for muscle spasms.   Yes [provider]  cephALEXin (KEFLEX) 250 MG capsule Take 1 capsule (250 mg total) by mouth 2 (two) times daily. Patient not taking: Reported on 10/17/2019 08/15/19   Betty Sleeper, PA-C   Allergies  Allergen Reactions  . Cymbalta [Duloxetine Hcl]     " couldn't make  water"- unable to urinate   Review of Systems  Unable to perform ROS: Dementia   Physical Exam Vitals signs and nursing note reviewed.  Constitutional:      Appearance: She is ill-appearing.  HENT:     Head: Normocephalic and atraumatic.  Cardiovascular:     Rate and Rhythm: Rhythm irregularly irregular.  Pulmonary:     Effort: No tachypnea, accessory muscle usage or respiratory distress.  Skin:    General: Skin is warm and dry.     Coloration: Skin is pale.  Neurological:     Mental Status: She is easily aroused.     Comments: Drowsy, pleasant confusion with baseline dementia    Vital Signs: BP 98/73 (BP Location: Right Arm)   Pulse 84   Temp 98.2 F (36.8 C) (Oral)   Resp (!) 21   Ht 5\' 2"  (1.575 m)   Wt 53.6 kg   SpO2 100%   BMI 21.61 kg/m  Pain Scale: 0-10   Pain Score: Asleep   SpO2: SpO2: 100 % O2 Device:SpO2: 100 % O2 Flow Rate: .   IO: Intake/output summary:   Intake/Output Summary (Last 24 hours) at 10/19/2019 1534 Last data filed at 10/19/2019 0300 Gross per 24 hour  Intake 252.95 ml  Output -  Net 252.95 ml    LBM:   Baseline Weight: Weight: 52.2 kg Most recent weight: Weight: 53.6 kg     Palliative Assessment/Data: PPS 40%   Flowsheet Rows     Most Recent Value  Intake Tab  Referral Department  Hospitalist  Unit at Time of Referral  ICU  Palliative Care Primary Diagnosis  Neurology  Palliative Care Type  New Palliative care  Reason for referral  Clarify Goals of Care  Date first seen by Palliative Care  10/19/19  Clinical Assessment  Palliative Performance Scale Score  40%  Psychosocial & Spiritual Assessment  Palliative Care Outcomes  Patient/Family meeting held?  Yes  Who was at the meeting?  daughter via telephone  Palliative Care Outcomes  Clarified goals of care, Counseled regarding hospice, Provided end of life care assistance, Provided psychosocial or spiritual support, ACP counseling assistance, Transitioned to hospice       Time In: [id number] Time Out: 1345 Time Total: 60 Greater than 50%  of this time was spent counseling and coordinating care related to the above assessment and plan.  Signed by:  Ihor Dow, DNP, FNP-C Palliative Medicine Team  Phone: [phone] Fax: [phone]   Please contact Palliative Medicine Team phone at [phone] for questions and concerns.  For individual provider: See Shea Evans

## 2019-10-19 NOTE — Progress Notes (Signed)
  Nutrition Brief Note   REASON FOR ASSESSMENT:   Malnutrition Screening Tool    ASSESSMENT:  85 year old female with past medical history significant for DVT, HTN, a-fib with RVR, chronic back pain, sciatica, arthritis who presented to ED from [facility] with reports of bradycardia, hypotension, and confusion. Upon ED presentation patient alert and oriented, complaining of back pain and admitted with bradycardia likely secondary to digoxin toxicity.  Per chart review, PCT spoke with daughter via phone who reports patient's health has continued to decline with progression of dementia s/p death of her husband 3 weeks ago. Daughter wishes for comfort measures; discharge plans are ALF with hospice.   No nutrition interventions warranted at this time, please consult as needed.    Lajuan Lines, [place], [place] Clinical Nutrition Office [phone] After Hours/Weekend Pager: [phone]

## 2019-10-19 NOTE — Progress Notes (Addendum)
PROGRESS NOTE    Betty Carpenter  [id number] DOB: 1925/04/05 DOA: 10/17/2019 PCP: Terald Sleeper, PA-C   Brief Narrative:  85 y.o.femalewith medical history significant forDVT, hypertension, atrial fibrillation with RVR. Patient was brought to the ED from [facility] with reports of bradycardia and hypotension,with confusion. Heart rate was in the 30s, blood pressure 99/48. Patient at that time was alert and oriented x4, complaining of back pain. She subsequently became agitated and had to be given Geodon. On my evaluation patient is awake but appears sedated,not responding verbally or to directions. Patient's daughter Betty Carpenter me that when she saw patients 4days ago, it was like patient was drifting away while she was talking to her, this is not unusual for her. Patient had no complaints, but has chronic poor p.o. intake. She has been complaining of the same back pain for 30 to 40 years and is on pain medication for this. At baseline patient ambulates with a wheelchair, needs assistance with most activities of daily living,recognize family but cannot hold a conversation.  ED Course:Heart rate down to 30s. Blood pressure systolic down to [id number].Lactic acid 2.6> 3.UA with trace leukocytes, rare bacteria, greater than 50 WBC. Portable chest x-ray negative for acute disease. N/s1 L bolus given. Digoxinlevel 2.8. Slight improvement with 0.5 mg of atropine given. Patient subsequently put placed on epinephrine drip.Hospitalist to admit for bradycardia.  12/2: Patient was admitted with bradycardia likely secondary to digoxin toxicity and was given 3 doses of atropine and DigiFab and was placed on epinephrine which has now been weaned.  She is alert and awake this morning and does not appear to be significantly confused.  Heart rates are between 90-100 bpm and she has been seen by cardiology with plans to repeat EKG as well as digoxin level.  She appears stable for  transfer to telemetry today.  We will plan to have palliative care evaluation as well as PT evaluation.  Assessment & Plan:   Active Problems:   Bradycardia   Acute toxic encephalopathy-improving -No source of infection noted and this is likely related to digoxin toxicity and bradycardia -Urine with questionable findings of UTI, but no growth on urine culture -Antibiotics discontinued -Patient is from ALF and may require SNF on discharge versus hospice.  Appreciate PT and palliative care evaluation.  Bradycardia likely secondary to digoxin toxicity-improving -On presentation heart rate down to the 30s -Patient with underlying history of atrial fibrillation -Continue to hold AV nodal blockade agents and monitor carefully -Repeat EKG as well as digoxin level pending per cardiology -Elevated troponin in the setting of demand ischemia. -Patient received Digibind.  Most recent level 2.8. -Patient is stable for transfer to telemetry today  Persistent atrial fibrillation -May resume Eliquis 2.5 mg twice daily today -Rates currently well controlled and will continue monitoring on telemetry -Slowly add back AV nodal blockade agents per cardiology recommendations with increasing rates -Metoprolol IV as needed for now  Hypoglycemia-improved -Will start soft diet today -No chronically on hypoglycemic agents. -Continue to closely monitor CBGs patient is now eating -Continue D5 infusion.  Acute kidney injury-resolved -Patient creatinine baseline 0.6-0.8 -Continue IV fluid for now and recheck labs in a.m.  Hypokalemia-improved -Continue on IV fluid with potassium for repletion and monitor BMP in a.m.   DVT prophylaxis: Eliquis resumed 12/2 Code Status: DNR Family Communication:  Disposition Plan:  Per cardiology.  PT evaluation along with palliative care consultation ordered.  Transfer to telemetry today and continue close monitoring with careful up titration  of AV nodal blockade  agents.  Consultants:   Cardiology service  Palliative Care  Procedures:   See below for x-ray reports.  Antimicrobials:  Anti-infectives (From admission, onward)   Start     Dose/Rate Route Frequency Ordered Stop   10/19/19 2200  vancomycin (VANCOCIN) IVPB 750 mg/150 ml premix  Status:  Discontinued     750 mg 150 mL/hr over 60 Minutes Intravenous Every 48 hours 10/17/19 2016 10/18/19 1428   10/18/19 2200  ceFEPIme (MAXIPIME) 2 g in sodium chloride 0.9 % 100 mL IVPB  Status:  Discontinued     2 g 200 mL/hr over 30 Minutes Intravenous Every 24 hours 10/17/19 2016 10/18/19 1428   10/17/19 2030  metroNIDAZOLE (FLAGYL) IVPB 500 mg  Status:  Discontinued     500 mg 100 mL/hr over 60 Minutes Intravenous Every 8 hours 10/17/19 2001 10/18/19 1428   10/17/19 2015  ceFEPIme (MAXIPIME) 2 g in sodium chloride 0.9 % 100 mL IVPB     2 g 200 mL/hr over 30 Minutes Intravenous  Once 10/17/19 2012 10/18/19 0111   10/17/19 2015  vancomycin (VANCOCIN) IVPB 1000 mg/200 mL premix     1,000 mg 200 mL/hr over 60 Minutes Intravenous  Once 10/17/19 2012 10/18/19 0031       Subjective: Patient seen and evaluated today with no new acute complaints or concerns. No acute concerns or events noted overnight.  Her heart rates are now improved with 90-100 bpm range.  Objective: Vitals:   10/19/19 0846 10/19/19 1000 10/19/19 1123 10/19/19 1200  BP:  (!) 92/56 98/61 96/63   Pulse:  79 89 71  Resp:  17 19 16   Temp: 98.2 F (36.8 C)     TempSrc: Oral     SpO2:  100% 100% 100%  Weight:      Height:        Intake/Output Summary (Last 24 hours) at 10/19/2019 1304 Last data filed at 10/19/2019 0300 Gross per 24 hour  Intake 1392.72 ml  Output -  Net 1392.72 ml   Filed Weights   10/18/19 0130 10/18/19 0435 10/19/19 0400  Weight: 52.3 kg 52.3 kg 53.6 kg    Examination:  General exam: Appears calm and comfortable  Respiratory system: Clear to auscultation. Respiratory effort normal.  Currently  on room air. Cardiovascular system: S1 & S2 heard, irregular. No JVD, murmurs, rubs, gallops or clicks. No pedal edema. Gastrointestinal system: Abdomen is nondistended, soft and nontender. No organomegaly or masses felt. Normal bowel sounds heard. Central nervous system: Alert and awake Extremities: Symmetric 5 x 5 power. Skin: No rashes, lesions or ulcers Psychiatry: Flat affect.    Data Reviewed: I have personally reviewed following labs and imaging studies  CBC: Recent Labs  Lab 10/17/19 1502 10/18/19 0429 10/19/19 0412  WBC 10.7* 15.5* 6.1  NEUTROABS 9.0*  --   --   HGB 12.2 10.7* 12.0  HCT 38.8 34.5* 37.1  MCV 90.9 92.0 88.8  PLT 347 392 [id number]   Basic Metabolic Panel: Recent Labs  Lab 10/17/19 1502 10/17/19 1744 10/18/19 0429 10/18/19 1348 10/19/19 0412  NA 137  --  139 140 141  K 3.6  --  2.8* 3.7 3.5  CL 100  --  105 107 108  CO2 23  --  21* 23 22  GLUCOSE 54*  --  152* 110* 90  BUN 26*  --  26* 23 19  CREATININE 1.42*  --  1.37* 1.13* 0.78  CALCIUM 8.4*  --  8.0*  8.0* 8.2*  MG  --  2.1  --   --   --    GFR: Estimated Creatinine Clearance: 35.5 mL/min (by C-G formula based on SCr of 0.78 mg/dL). Liver Function Tests: Recent Labs  Lab 10/17/19 1502  AST 71*  ALT 25  ALKPHOS 295*  BILITOT 0.7  PROT 6.7  ALBUMIN 3.4*   No results for input(s): LIPASE, AMYLASE in the last 168 hours. No results for input(s): AMMONIA in the last 168 hours. Coagulation Profile: Recent Labs  Lab 10/17/19 1502  INR 1.5*   Cardiac Enzymes: No results for input(s): CKTOTAL, CKMB, CKMBINDEX, TROPONINI in the last 168 hours. BNP (last 3 results) No results for input(s): PROBNP in the last 8760 hours. HbA1C: No results for input(s): HGBA1C in the last 72 hours. CBG: Recent Labs  Lab 10/17/19 1842 10/17/19 1915 10/19/19 0407 10/19/19 0747  GLUCAP 51* 121* 85 82   Lipid Profile: No results for input(s): CHOL, HDL, LDLCALC, TRIG, CHOLHDL, LDLDIRECT in the last 72  hours. Thyroid Function Tests: Recent Labs    10/17/19 1744  TSH 3.127   Anemia Panel: No results for input(s): VITAMINB12, FOLATE, FERRITIN, TIBC, IRON, RETICCTPCT in the last 72 hours. Sepsis Labs: Recent Labs  Lab 10/17/19 1544 10/17/19 1744 10/17/19 2141 10/18/19 1434 10/19/19 0412  PROCALCITON  --   --   --  0.48 0.30  LATICACIDVEN 2.6* 3.0* 2.2*  --   --     Recent Results (from the past 240 hour(s))  SARS Coronavirus 2 by RT PCR (hospital order, performed in [place] hospital lab) Nasopharyngeal Nasopharyngeal Swab     Status: None   Collection Time: 10/17/19  3:29 PM   Specimen: Nasopharyngeal Swab  Result Value Ref Range Status   SARS Coronavirus 2 NEGATIVE NEGATIVE Final    Comment: (NOTE) SARS-CoV-2 target nucleic acids are NOT DETECTED. The SARS-CoV-2 RNA is generally detectable in upper and lower respiratory specimens during the acute phase of infection. The lowest concentration of SARS-CoV-2 viral copies this assay can detect is 250 copies / mL. A negative result does not preclude SARS-CoV-2 infection and should not be used as the sole basis for treatment or other patient management decisions.  A negative result may occur with improper specimen collection / handling, submission of specimen other than nasopharyngeal swab, presence of viral mutation(s) within the areas targeted by this assay, and inadequate number of viral copies (<250 copies / mL). A negative result must be combined with clinical observations, patient history, and epidemiological information. Fact Sheet for Patients:   StrictlyIdeas.no Fact Sheet for Healthcare Providers: BankingDealers.co.za This test is not yet approved or cleared  by the [place] FDA and has been authorized for detection and/or diagnosis of SARS-CoV-2 by FDA under an Emergency Use Authorization (EUA).  This EUA will remain in effect (meaning this test can be used)  for the duration of the COVID-19 declaration under Section 564(b)(1) of the Act, 21 U.S.C. section 360bbb-3(b)(1), unless the authorization is terminated or revoked sooner. Performed at [facility], [address] [place], [place] [address]   Urine culture     Status: None   Collection Time: 10/17/19  3:30 PM   Specimen: Urine, Random  Result Value Ref Range Status   Specimen Description   Final    URINE, RANDOM Performed at [facility], [address] [place], [place] [address]    Special Requests   Final    NONE Performed at [facility], [address]  [place], [place] [address]    Culture   Final    NO GROWTH Performed at [facility], [place] [address], [place], [place] [address]    Report Status 10/19/2019 FINAL  Final  Culture, blood (routine x 2)     Status: None (Preliminary result)   Collection Time: 10/17/19  3:53 PM   Specimen: BLOOD  Result Value Ref Range Status   Specimen Description BLOOD BOTTLES DRAWN AEROBIC ONLY LEFT ANTECUBITAL  Final   Special Requests Blood Culture adequate volume  Final   Culture   Final    NO GROWTH 2 DAYS Performed at [facility], [address] [place], [place] [address]    Report Status PENDING  Incomplete  Culture, blood (routine x 2)     Status: None (Preliminary result)   Collection Time: 10/17/19  3:53 PM   Specimen: BLOOD LEFT WRIST  Result Value Ref Range Status   Specimen Description BLOOD LEFT WRIST  Final   Special Requests   Final    BOTTLES DRAWN AEROBIC AND ANAEROBIC Blood Culture adequate volume   Culture   Final    NO GROWTH 2 DAYS Performed at [facility], [address] [place], [place] [address]    Report Status PENDING  Incomplete  MRSA PCR Screening     Status: None   Collection Time: 10/17/19 10:53 PM   Specimen: Nasopharyngeal  Result Value Ref Range Status   MRSA by PCR NEGATIVE NEGATIVE Final    Comment:        The GeneXpert MRSA Assay (FDA approved for NASAL specimens only), is one  component of a comprehensive MRSA colonization surveillance program. It is not intended to diagnose MRSA infection nor to guide or monitor treatment for MRSA infections. Performed at [facility], [address] [place], [place] [address]          Radiology Studies: Ct Head Wo Contrast  Result Date: 10/17/2019 CLINICAL DATA:  Altered mental status EXAM: CT HEAD WITHOUT CONTRAST TECHNIQUE: Contiguous axial images were obtained from the base of the skull through the vertex without intravenous contrast. COMPARISON:  06/28/2019 FINDINGS: Brain: There is atrophy and chronic small vessel disease changes. No acute intracranial abnormality. Specifically, no hemorrhage, hydrocephalus, mass lesion, acute infarction, or significant intracranial injury. Vascular: No hyperdense vessel or unexpected calcification. Skull: No acute calvarial abnormality. Sinuses/Orbits: Visualized paranasal sinuses and mastoids clear. Orbital soft tissues unremarkable. Other: None IMPRESSION: Atrophy, chronic microvascular disease. No acute intracranial abnormality. Electronically Signed   By: Rolm Baptise M.D.   On: 10/17/2019 21:35   Dg Chest Port 1 View  Result Date: 10/17/2019 CLINICAL DATA:  Hypotension EXAM: PORTABLE CHEST 1 VIEW COMPARISON:  06/28/2019 chest radiograph. FINDINGS: Right rotated chest radiograph. Pacer pad overlies the left chest. Surgical clips overlie the right axilla. Stable cardiomediastinal silhouette with top-normal heart size. No pneumothorax. No pleural effusion. Lungs appear clear, with no acute consolidative airspace disease and no pulmonary edema. Vertebroplasty material overlies a lower thoracic vertebral compression fracture. IMPRESSION: Right rotated chest radiograph with no active cardiopulmonary disease. Electronically Signed   By: Ilona Sorrel M.D.   On: 10/17/2019 15:38        Scheduled Meds: . apixaban  2.5 mg Oral BID  . Chlorhexidine Gluconate Cloth  6 each Topical Daily   . mouth rinse  15 mL Mouth Rinse BID  . oxyCODONE  10 mg Oral Q12H   Continuous Infusions: . dextrose 5 % with KCl 20 mEq / L 50 mL/hr  at 10/19/19 0300     LOS: 2 days    Time spent: 30 minutes    Blessed Girdner Darleen Crocker, DO [facility] Pager [phone]  If 7PM-7AM, please contact night-coverage [email or web address] Password [email or web address] 10/19/2019, 1:04 PM

## 2019-10-20 DIAGNOSIS — Z515 Encounter for palliative care: Secondary | ICD-10-CM

## 2019-10-20 DIAGNOSIS — F0390 Unspecified dementia without behavioral disturbance: Secondary | ICD-10-CM

## 2019-10-20 LAB — BASIC METABOLIC PANEL
Anion gap: 10 (ref 5–15)
BUN: 15 mg/dL (ref 8–23)
CO2: 22 mmol/L (ref 22–32)
Calcium: 8.4 mg/dL — ABNORMAL LOW (ref 8.9–10.3)
Chloride: 108 mmol/L (ref 98–111)
Creatinine, Ser: 0.56 mg/dL (ref 0.44–1.00)
GFR calc Af Amer: >60 mL/min (ref >60)
GFR calc non Af Amer: >60 mL/min (ref >60)
Glucose, Bld: 99 mg/dL (ref 70–99)
Potassium: 4 mmol/L (ref 3.5–5.1)
Sodium: 140 mmol/L (ref 135–145)

## 2019-10-20 LAB — CBC
HCT: 38.8 % (ref 36.0–46.0)
Hemoglobin: 12.5 g/dL (ref 12.0–15.0)
MCH: 28.5 pg (ref 26.0–34.0)
MCHC: 32.2 g/dL (ref 30.0–36.0)
MCV: 88.6 fL (ref 80.0–100.0)
Platelets: 273 10*3/uL (ref 150–400)
RBC: 4.38 MIL/uL (ref 3.87–5.11)
RDW: 18.2 % — ABNORMAL HIGH (ref 11.5–15.5)
WBC: 6 10*3/uL (ref 4.0–10.5)
nRBC: 0 % (ref 0.0–0.2)

## 2019-10-20 LAB — PROCALCITONIN: Procalcitonin: 0.14 ng/mL

## 2019-10-20 LAB — MAGNESIUM: Magnesium: 2 mg/dL (ref 1.7–2.4)

## 2019-10-20 MED ORDER — METOPROLOL TARTRATE 50 MG PO TABS
50.0000 mg | ORAL_TABLET | Freq: Two times a day (BID) | ORAL | Status: DC
  Administered 2019-10-20: 50 mg via ORAL
  Filled 2019-10-20: qty 1

## 2019-10-20 MED ORDER — METOPROLOL TARTRATE 50 MG PO TABS: 50.0000 mg | ORAL_TABLET | Freq: Two times a day (BID) | ORAL | 0 refills | Status: AC

## 2019-10-20 MED ORDER — OXYCODONE HCL ER 15 MG PO T12A: 15.0000 mg | EXTENDED_RELEASE_TABLET | Freq: Two times a day (BID) | ORAL | 0 refills | Status: AC

## 2019-10-20 MED ORDER — TIZANIDINE HCL 4 MG PO TABS: 4.0000 mg | ORAL_TABLET | Freq: Four times a day (QID) | ORAL | 0 refills | Status: AC | PRN

## 2019-10-20 NOTE — TOC Transition Note (Addendum)
Transition of Care [facility]) - CM/SW Discharge Note   Patient Details  Name: Betty Carpenter MRN: [id number] Date of Birth: 1925/07/07  Transition of Care [facility]) CM/SW Contact:  Najla Aughenbaugh, Chauncey Reading, RN Phone Number: 10/20/2019, 10:51 AM   Clinical Narrative:   Patient discharging back to [facility] today with hospice services. Hospital bed has arrived at facility per [facility]. Clinicals uploaded.  Bedside RN to call EMS.  Daughter updated.    Final next level of care: Assisted Living(with hospice) Barriers to Discharge: Continued Medical Work up      Discharge Plan and Services   Discharge Planning Services: CM Consult Post Acute Care Choice: Hospice                      [facility] Agency: [facility] Determinants of Health (SDOH) Interventions     Readmission Risk Interventions Readmission Risk Prevention Plan 10/19/2019 03/22/2019  Transportation Screening Complete Complete  PCP or Specialist Appt within 5-7 Days - Complete  Home Care Screening - Complete  Medication Review (RN CM) - Complete  Medication Review [employer] photographer) Complete -  PCP or Specialist appointment within 3-5 days of discharge Not Complete -  PCP/Specialist Appt Not Complete comments return to ALF with hospice care -  [place] or Home Care Consult Complete -  SW Recovery Care/Counseling Consult Complete -  Palliative Care Screening Complete -  Skilled Nursing Facility Complete -  Some recent data might be hidden

## 2019-10-20 NOTE — Progress Notes (Signed)
Telemetry reviewed this AM. Afib with elevated rates. We had held her av nodal agents at admission in setting of severe bradycardia. Loletha Grayer has resolved. Restart lopressor 50mg  bid today and follow rates. May titrate further, may add back some of her prior home diltiazem. Would not restart digoxin at any point for her. I see from palliative notes plans for discharge to hospice. We will sign off inpatient care, would add lopressor to her discharge regimen.    Carlyle Dolly MD

## 2019-10-20 NOTE — Progress Notes (Signed)
Nsg Discharge Note  Admit Date:  10/17/2019 Discharge date: 10/20/2019   Betty Carpenter to be D/C'd to [facility] per MD order.  AVS completed.  Copy for chart, and copy for patient signed, and dated. Patient/caregiver able to verbalize understanding.  Discharge Medication: Allergies as of 10/20/2019      Reactions   Cymbalta [duloxetine Hcl]    " couldn't make water"- unable to urinate      Medication List    STOP taking these medications   amLODipine 5 MG tablet Commonly known as: NORVASC   cephALEXin 250 MG capsule Commonly known as: Keflex   digoxin 0.25 MG tablet Commonly known as: LANOXIN   diltiazem 240 MG 24 hr capsule Commonly known as: CARDIZEM CD   Eliquis 2.5 MG Tabs tablet Generic drug: apixaban   Imodium A-D 2 MG tablet Generic drug: loperamide   lubiprostone 24 MCG capsule Commonly known as: AMITIZA   ondansetron 4 MG disintegrating tablet Commonly known as: ZOFRAN-ODT     TAKE these medications   acetaminophen 325 MG tablet Commonly known as: TYLENOL Take 650 mg by mouth 4 (four) times daily.   docusate sodium 100 MG capsule Commonly known as: COLACE Take 100 mg by mouth 2 (two) times daily as needed for constipation.   metoprolol tartrate 50 MG tablet Commonly known as: LOPRESSOR Take 1 tablet (50 mg total) by mouth 2 (two) times daily.   multivitamin with minerals tablet Take 1 tablet by mouth daily.   oxyCODONE 15 mg 12 hr tablet Commonly known as: OxyCONTIN Take 1 tablet (15 mg total) by mouth every 12 (twelve) hours for 10 days.   pantoprazole 20 MG tablet Commonly known as: Protonix Take 1 tablet (20 mg total) by mouth daily.   tiZANidine 4 MG tablet Commonly known as: ZANAFLEX Take 1 tablet (4 mg total) by mouth every 6 (six) hours as needed for muscle spasms.       Discharge Assessment: Vitals:   10/20/19 0427 10/20/19 0923  BP: (!) 134/93 (!) 144/117  Pulse: (!) 107 61  Resp:    Temp: 97.8 F (36.6 C)   SpO2: 98%  98%   Skin clean, dry and intact without evidence of skin break down, no evidence of skin tears noted. IV catheter discontinued intact. Site without signs and symptoms of complications - no redness or edema noted at insertion site, patient denies c/o pain - only slight tenderness at site.  Dressing with slight pressure applied.  D/c Instructions-Education: Discharge instructions given to patient/family with verbalized understanding. D/c education completed with patient/family including follow up instructions, medication list, d/c activities limitations if indicated, with other d/c instructions as indicated by MD - patient able to verbalize understanding, all questions fully answered. Patient instructed to return to ED, call 911, or call MD for any changes in condition.  Patient escorted to [facility] via EMS.  Carney Corners, RN 10/20/2019 11:38 AM

## 2019-10-20 NOTE — Discharge Summary (Signed)
Physician Discharge Summary  Betty Carpenter [id number] DOB: 1925-11-06 DOA: 10/17/2019  PCP: Terald Sleeper, PA-C  Admit date: 10/17/2019  Discharge date: 10/20/2019  Admitted From:ALF  Disposition:  ALF with hospice  Recommendations for Outpatient Follow-up:  1. Follow up with PCP in 1-2 weeks 2. Continue on metoprolol 50 mg p.o. twice daily as recommended by cardiology for heart rate control 3. Continue on prior pain medications with further pain management per hospice  Home Health: None  Equipment/Devices: Will have hospital bed  Discharge Condition: Stable  CODE STATUS: DNR  Diet recommendation: Heart Healthy  Brief/Interim Summary: Per HPI: 85 y.o.femalewith medical history significant forDVT, hypertension, atrial fibrillation with RVR. Patient was brought to the ED from [facility] with reports of bradycardia and hypotension,with confusion. Heart rate was in the 30s, blood pressure 99/48. Patient at that time was alert and oriented x4, complaining of back pain. She subsequently became agitated and had to be given Geodon. On my evaluation patient is awake but appears sedated,not responding verbally or to directions. Patient's daughter [name] me that when she saw patients 4days ago, it was like patient was drifting away while she was talking to her, this is not unusual for her. Patient had no complaints, but has chronic poor p.o. intake. She has been complaining of the same back pain for 30 to 40 years and is on pain medication for this. At baseline patient ambulates with a wheelchair, needs assistance with most activities of daily living,recognize family but cannot hold a conversation.  ED Course:Heart rate down to 30s. Blood pressure systolic down to [id number].Lactic acid 2.6>3.UA with trace leukocytes, rare bacteria, greater than 50 WBC. Portable chest x-ray negative for acute disease. N/s1 L bolus given. Digoxinlevel 2.8. Slight  improvement with 0.5 mg of atropine given. Patient subsequently put placed on epinephrine drip.Hospitalist to admit for bradycardia.  12/2: Patient was admitted with bradycardia likely secondary to digoxin toxicity and was given 3 doses of atropine and DigiFab and was placed on epinephrine which has now been weaned.  She is alert and awake this morning and does not appear to be significantly confused.  Heart rates are between 90-100 bpm and she has been seen by cardiology with plans to repeat EKG as well as digoxin level.  She appears stable for transfer to telemetry today.  We will plan to have palliative care evaluation as well as PT evaluation.  12/3: Patient no longer has any bradycardia and has some elevated heart rates.  Cardiology recommends maintaining on metoprolol 50 mg twice daily for now with no other medications as she will be going to hospice.  She appears stable for discharge and is currently comfortable.  We will arrange for transfer back to her assisted living facility with home hospice services.  This was discussed with her daughter on 12/2 by myself as well as with palliative care RN.  She is agreeable to having her mother go to home hospice and does not want her to have further rehab services.  Discharge Diagnoses:  Active Problems:   Symptomatic bradycardia   Palliative care by specialist   Goals of care, counseling/discussion   Dementia without behavioral disturbance ([place])  Principal discharge diagnosis: Acute toxic encephalopathy secondary to symptomatic bradycardia with digoxin toxicity.  Discharge Instructions  Discharge Instructions    Diet - low sodium heart healthy   Complete by: As directed    Increase activity slowly   Complete by: As directed      Allergies as of  10/20/2019      Reactions   Cymbalta [duloxetine Hcl]    " couldn't make water"- unable to urinate      Medication List    STOP taking these medications   amLODipine 5 MG tablet Commonly  known as: NORVASC   cephALEXin 250 MG capsule Commonly known as: Keflex   digoxin 0.25 MG tablet Commonly known as: LANOXIN   diltiazem 240 MG 24 hr capsule Commonly known as: CARDIZEM CD   Eliquis 2.5 MG Tabs tablet Generic drug: apixaban   Imodium A-D 2 MG tablet Generic drug: loperamide   lubiprostone 24 MCG capsule Commonly known as: AMITIZA   ondansetron 4 MG disintegrating tablet Commonly known as: ZOFRAN-ODT     TAKE these medications   acetaminophen 325 MG tablet Commonly known as: TYLENOL Take 650 mg by mouth 4 (four) times daily.   docusate sodium 100 MG capsule Commonly known as: COLACE Take 100 mg by mouth 2 (two) times daily as needed for constipation.   metoprolol tartrate 50 MG tablet Commonly known as: LOPRESSOR Take 1 tablet (50 mg total) by mouth 2 (two) times daily.   multivitamin with minerals tablet Take 1 tablet by mouth daily.   oxyCODONE 15 mg 12 hr tablet Commonly known as: OxyCONTIN Take 1 tablet (15 mg total) by mouth every 12 (twelve) hours for 10 days.   pantoprazole 20 MG tablet Commonly known as: Protonix Take 1 tablet (20 mg total) by mouth daily.   tiZANidine 4 MG tablet Commonly known as: ZANAFLEX Take 1 tablet (4 mg total) by mouth every 6 (six) hours as needed for muscle spasms.      Follow-up Information    Terald Sleeper, PA-C Follow up in 1 week(s).   Specialties: Physician Assistant, Family Medicine Contact information: [place] [address] [phone]        Pixie Casino, MD .   Specialty: Cardiology Contact information: [place] [id number] [phone]          Allergies  Allergen Reactions  . Cymbalta [Duloxetine Hcl]     " couldn't make water"- unable to urinate    Consultations:  Cardiology  Palliative care   Procedures/Studies: Ct Head Wo Contrast  Result Date: 10/17/2019 CLINICAL DATA:  Altered mental status EXAM: CT HEAD WITHOUT  CONTRAST TECHNIQUE: Contiguous axial images were obtained from the base of the skull through the vertex without intravenous contrast. COMPARISON:  06/28/2019 FINDINGS: Brain: There is atrophy and chronic small vessel disease changes. No acute intracranial abnormality. Specifically, no hemorrhage, hydrocephalus, mass lesion, acute infarction, or significant intracranial injury. Vascular: No hyperdense vessel or unexpected calcification. Skull: No acute calvarial abnormality. Sinuses/Orbits: Visualized paranasal sinuses and mastoids clear. Orbital soft tissues unremarkable. Other: None IMPRESSION: Atrophy, chronic microvascular disease. No acute intracranial abnormality. Electronically Signed   By: Rolm Baptise M.D.   On: 10/17/2019 21:35   Dg Chest Port 1 View  Result Date: 10/17/2019 CLINICAL DATA:  Hypotension EXAM: PORTABLE CHEST 1 VIEW COMPARISON:  06/28/2019 chest radiograph. FINDINGS: Right rotated chest radiograph. Pacer pad overlies the left chest. Surgical clips overlie the right axilla. Stable cardiomediastinal silhouette with top-normal heart size. No pneumothorax. No pleural effusion. Lungs appear clear, with no acute consolidative airspace disease and no pulmonary edema. Vertebroplasty material overlies a lower thoracic vertebral compression fracture. IMPRESSION: Right rotated chest radiograph with no active cardiopulmonary disease. Electronically Signed   By: Ilona Sorrel M.D.   On: 10/17/2019 15:38  Discharge Exam: Vitals:   10/20/19 0427 10/20/19 0923  BP: (!) 134/93 (!) 144/117  Pulse: (!) 107 61  Resp:    Temp: 97.8 F (36.6 C)   SpO2: 98% 98%   Vitals:   10/19/19 1829 10/19/19 2249 10/20/19 0427 10/20/19 0923  BP:   (!) 134/93 (!) 144/117  Pulse: 74 98 (!) 107 61  Resp: 16 20    Temp: 98.4 F (36.9 C) 97.9 F (36.6 C) 97.8 F (36.6 C)   TempSrc: Axillary Axillary Oral   SpO2: 97% 97% 98% 98%  Weight:      Height:        General: Pt is currently  somnolent Cardiovascular: Irregular and tachycardic, S1/S2 +, no rubs, no gallops Respiratory: CTA bilaterally, no wheezing, no rhonchi Abdominal: Soft, NT, ND, bowel sounds + Extremities: no edema, no cyanosis    The results of significant diagnostics from this hospitalization (including imaging, microbiology, ancillary and laboratory) are listed below for reference.     Microbiology: Recent Results (from the past 240 hour(s))  SARS Coronavirus 2 by RT PCR (hospital order, performed in [facility] hospital lab) Nasopharyngeal Nasopharyngeal Swab     Status: None   Collection Time: 10/17/19  3:29 PM   Specimen: Nasopharyngeal Swab  Result Value Ref Range Status   SARS Coronavirus 2 NEGATIVE NEGATIVE Final    Comment: (NOTE) SARS-CoV-2 target nucleic acids are NOT DETECTED. The SARS-CoV-2 RNA is generally detectable in upper and lower respiratory specimens during the acute phase of infection. The lowest concentration of SARS-CoV-2 viral copies this assay can detect is 250 copies / mL. A negative result does not preclude SARS-CoV-2 infection and should not be used as the sole basis for treatment or other patient management decisions.  A negative result may occur with improper specimen collection / handling, submission of specimen other than nasopharyngeal swab, presence of viral mutation(s) within the areas targeted by this assay, and inadequate number of viral copies (<250 copies / mL). A negative result must be combined with clinical observations, patient history, and epidemiological information. Fact Sheet for Patients:   StrictlyIdeas.no Fact Sheet for Healthcare Providers: BankingDealers.co.za This test is not yet approved or cleared  by the [place] FDA and has been authorized for detection and/or diagnosis of SARS-CoV-2 by FDA under an Emergency Use Authorization (EUA).  This EUA will remain in effect (meaning this test  can be used) for the duration of the COVID-19 declaration under Section 564(b)(1) of the Act, 21 U.S.C. section 360bbb-3(b)(1), unless the authorization is terminated or revoked sooner. Performed at [facility], [address], [place], [place] [address]   Urine culture     Status: None   Collection Time: 10/17/19  3:30 PM   Specimen: Urine, Random  Result Value Ref Range Status   Specimen Description   Final    URINE, RANDOM Performed at [facility], [address] [place], [place] [address]    Special Requests   Final    NONE Performed at [facility], [address], [place], [place] [address]    Culture   Final    NO GROWTH Performed at [facility], [place] [address] [place],  [address]    Report Status 10/19/2019 FINAL  Final  Culture, blood (routine x 2)     Status: None (Preliminary result)   Collection Time: 10/17/19  3:53 PM   Specimen: BLOOD  Result Value Ref Range Status   Specimen Description BLOOD BOTTLES DRAWN AEROBIC ONLY  LEFT ANTECUBITAL  Final   Special Requests Blood Culture adequate volume  Final   Culture   Final    NO GROWTH 3 DAYS Performed at [facility], [address], [place], [place] [address]    Report Status PENDING  Incomplete  Culture, blood (routine x 2)     Status: None (Preliminary result)   Collection Time: 10/17/19  3:53 PM   Specimen: BLOOD LEFT WRIST  Result Value Ref Range Status   Specimen Description BLOOD LEFT WRIST  Final   Special Requests   Final    BOTTLES DRAWN AEROBIC AND ANAEROBIC Blood Culture adequate volume   Culture   Final    NO GROWTH 3 DAYS Performed at [facility], [address], [place], [place] [address]    Report Status PENDING  Incomplete  MRSA PCR Screening     Status: None   Collection Time: 10/17/19 10:53 PM   Specimen: Nasopharyngeal  Result Value Ref Range Status   MRSA by PCR NEGATIVE NEGATIVE Final    Comment:        The GeneXpert MRSA Assay (FDA approved for NASAL specimens only),  is one component of a comprehensive MRSA colonization surveillance program. It is not intended to diagnose MRSA infection nor to guide or monitor treatment for MRSA infections. Performed at [facility], [address], [place], [place] [address]      Labs: BNP (last 3 results) No results for input(s): BNP in the last 8760 hours. Basic Metabolic Panel: Recent Labs  Lab 10/17/19 1502 10/17/19 1744 10/18/19 0429 10/18/19 1348 10/19/19 0412 10/20/19 0534  NA 137  --  139 140 141 140  K 3.6  --  2.8* 3.7 3.5 4.0  CL 100  --  105 107 108 108  CO2 23  --  21* 23 22 22   GLUCOSE 54*  --  152* 110* 90 99  BUN 26*  --  26* 23 19 15   CREATININE 1.42*  --  1.37* 1.13* 0.78 0.56  CALCIUM 8.4*  --  8.0* 8.0* 8.2* 8.4*  MG  --  2.1  --   --   --  2.0   Liver Function Tests: Recent Labs  Lab 10/17/19 1502  AST 71*  ALT 25  ALKPHOS 295*  BILITOT 0.7  PROT 6.7  ALBUMIN 3.4*   No results for input(s): LIPASE, AMYLASE in the last 168 hours. No results for input(s): AMMONIA in the last 168 hours. CBC: Recent Labs  Lab 10/17/19 1502 10/18/19 0429 10/19/19 0412 10/20/19 0719  WBC 10.7* 15.5* 6.1 6.0  NEUTROABS 9.0*  --   --   --   HGB 12.2 10.7* 12.0 12.5  HCT 38.8 34.5* 37.1 38.8  MCV 90.9 92.0 88.8 88.6  PLT 347 392 273 273   Cardiac Enzymes: No results for input(s): CKTOTAL, CKMB, CKMBINDEX, TROPONINI in the last 168 hours. BNP: Invalid input(s): POCBNP CBG: Recent Labs  Lab 10/17/19 1842 10/17/19 1915 10/19/19 0407 10/19/19 0747  GLUCAP 51* 121* 85 82   D-Dimer No results for input(s): DDIMER in the last 72 hours. Hgb A1c No results for input(s): HGBA1C in the last 72 hours. Lipid Profile No results for input(s): CHOL, HDL, LDLCALC, TRIG, CHOLHDL, LDLDIRECT in the last 72 hours. Thyroid function studies Recent Labs    10/17/19 1744  TSH 3.127   Anemia work up No results for input(s): VITAMINB12, FOLATE, FERRITIN, TIBC, IRON, RETICCTPCT in the last 72  hours. Urinalysis    Component Value Date/Time  COLORURINE AMBER (A) 10/17/2019 1530   APPEARANCEUR CLOUDY (A) 10/17/2019 1530   LABSPEC 1.029 10/17/2019 1530   PHURINE 5.0 10/17/2019 1530   GLUCOSEU NEGATIVE 10/17/2019 1530   HGBUR LARGE (A) 10/17/2019 [place] 10/17/2019 [place] 10/17/2019 1530   PROTEINUR 100 (A) 10/17/2019 1530   NITRITE NEGATIVE 10/17/2019 1530   LEUKOCYTESUR TRACE (A) 10/17/2019 1530   Sepsis Labs Invalid input(s): PROCALCITONIN,  WBC,  LACTICIDVEN Microbiology Recent Results (from the past 240 hour(s))  SARS Coronavirus 2 by RT PCR (hospital order, performed in [place] hospital lab) Nasopharyngeal Nasopharyngeal Swab     Status: None   Collection Time: 10/17/19  3:29 PM   Specimen: Nasopharyngeal Swab  Result Value Ref Range Status   SARS Coronavirus 2 NEGATIVE NEGATIVE Final    Comment: (NOTE) SARS-CoV-2 target nucleic acids are NOT DETECTED. The SARS-CoV-2 RNA is generally detectable in upper and lower respiratory specimens during the acute phase of infection. The lowest concentration of SARS-CoV-2 viral copies this assay can detect is 250 copies / mL. A negative result does not preclude SARS-CoV-2 infection and should not be used as the sole basis for treatment or other patient management decisions.  A negative result may occur with improper specimen collection / handling, submission of specimen other than nasopharyngeal swab, presence of viral mutation(s) within the areas targeted by this assay, and inadequate number of viral copies (<250 copies / mL). A negative result must be combined with clinical observations, patient history, and epidemiological information. Fact Sheet for Patients:   StrictlyIdeas.no Fact Sheet for Healthcare Providers: BankingDealers.co.za This test is not yet approved or cleared  by the [place] FDA and has been authorized for  detection and/or diagnosis of SARS-CoV-2 by FDA under an Emergency Use Authorization (EUA).  This EUA will remain in effect (meaning this test can be used) for the duration of the COVID-19 declaration under Section 564(b)(1) of the Act, 21 U.S.C. section 360bbb-3(b)(1), unless the authorization is terminated or revoked sooner. Performed at [facility], [address], [place], [place] [address]   Urine culture     Status: None   Collection Time: 10/17/19  3:30 PM   Specimen: Urine, Random  Result Value Ref Range Status   Specimen Description   Final    URINE, RANDOM Performed at [facility], [address], [place], [place] [address]    Special Requests   Final    NONE Performed at [facility], [address] [place], [place] [address]    Culture   Final    NO GROWTH Performed at [facility], [place] [address], [place], [place] [address]    Report Status 10/19/2019 FINAL  Final  Culture, blood (routine x 2)     Status: None (Preliminary result)   Collection Time: 10/17/19  3:53 PM   Specimen: BLOOD  Result Value Ref Range Status   Specimen Description BLOOD BOTTLES DRAWN AEROBIC ONLY LEFT ANTECUBITAL  Final   Special Requests Blood Culture adequate volume  Final   Culture   Final    NO GROWTH 3 DAYS Performed at [facility], [address] [place], [place] [address]    Report Status PENDING  Incomplete  Culture, blood (routine x 2)     Status: None (Preliminary result)   Collection Time: 10/17/19  3:53 PM   Specimen: BLOOD LEFT WRIST  Result Value Ref Range Status   Specimen Description BLOOD LEFT WRIST  Final   Special  Requests   Final    BOTTLES DRAWN AEROBIC AND ANAEROBIC Blood Culture adequate volume   Culture   Final    NO GROWTH 3 DAYS Performed at [facility], [address] [place], [place] [address]    Report Status PENDING  Incomplete  MRSA PCR Screening     Status: None   Collection Time: 10/17/19 10:53 PM   Specimen: Nasopharyngeal  Result Value  Ref Range Status   MRSA by PCR NEGATIVE NEGATIVE Final    Comment:        The GeneXpert MRSA Assay (FDA approved for NASAL specimens only), is one component of a comprehensive MRSA colonization surveillance program. It is not intended to diagnose MRSA infection nor to guide or monitor treatment for MRSA infections. Performed at [facility], [address] [place], [place] [address]      Time coordinating discharge: 35 minutes  SIGNED:   Rodena Goldmann, DO [facility] 10/20/2019, 9:51 AM  If 7PM-7AM, please contact night-coverage [email or web address]

## 2019-10-20 NOTE — NC FL2 (Signed)
[place] MEDICAID FL2 LEVEL OF CARE SCREENING TOOL     IDENTIFICATION  Patient Name: Betty Carpenter Birthdate: 10-08-1925 Sex: female Admission Date (Current Location): 10/17/2019  [facility] and [place] Number:  [employer] and Address:  [place] [address], [place]      Provider Number: [id number]  Attending Physician Name and Address:  Rodena Goldmann, DO  Relative Name and Phone Number:  [name] [phone]    Current Level of Care: Hospital Recommended Level of Care: [place] Prior Approval Number: [id number]  Date Approved/Denied:   PASRR Number:    Discharge Plan: (with hospice services)    Current Diagnoses: Patient Active Problem List   Diagnosis Date Noted  . Palliative care by specialist   . Goals of care, counseling/discussion   . Dementia without behavioral disturbance ([place])   . Symptomatic bradycardia 10/17/2019  . Malnutrition of moderate degree 07/01/2019  . Closed displaced fracture of left femoral neck ([place]) 06/28/2019  . Atrial fibrillation with rapid ventricular response ([place]) 06/28/2019  . DDD (degenerative disc disease), lumbar 06/15/2019  . HTN (hypertension) 06/15/2019  . Hx: UTI (urinary tract infection) 06/15/2019  . Lumbar spinal stenosis 06/15/2019  . Lumbar spondylolysis 06/15/2019  . Osteoarthrosis 06/15/2019  . Venous ulcer of ankle, right ([place]) 04/07/2019  . GERD without esophagitis 03/23/2019  . Constipation due to opioid therapy 03/23/2019  . Vitamin D deficiency 03/23/2019  . S/P right hip fracture with cannulated screw placement 03/18/19   . Anxiety   . Pressure injury of skin 03/18/2019  . Closed right hip fracture ([place]) 03/17/2019  . Sinus tachycardia 03/17/2019  . Arterial leg ulcer ([place]) 11/02/2018  . Lymphedema 11/02/2018  . Closed compression fracture of thoracic vertebra ([place]) 03/15/2018  . History of breast cancer 10/23/2017  . History of DVT (deep  vein thrombosis) 10/23/2017  . Chronic back pain 10/23/2017  . Benign essential HTN 10/23/2017  . Hypertensive urgency 10/23/2017  . Chronic, continuous use of opioids 10/23/2017  . Anemia 08/18/2016  . Chronic fatigue 05/13/2016  . Pernicious anemia 05/13/2016  . Dyslipidemia 01/15/2016  . History of right mastectomy 09/17/2015  . Conjunctival hemorrhage, left eye 02/13/2015  . Mixed incontinence 11/28/2014  . Breast cancer, stage 1 ([place]) 01/28/2012    Orientation RESPIRATION BLADDER Height & Weight     Self, Time  Normal Incontinent Weight: 53.6 kg Height:  5\' 2"  (157.5 cm)  BEHAVIORAL SYMPTOMS/MOOD NEUROLOGICAL BOWEL NUTRITION STATUS      Continent Diet(soft diet)  AMBULATORY STATUS COMMUNICATION OF NEEDS Skin   Extensive Assist Verbally Normal                       Personal Care Assistance Level of Assistance  Bathing, Dressing, Feeding Bathing Assistance: Maximum assistance Feeding assistance: Limited assistance Dressing Assistance: Maximum assistance     Functional Limitations Info  Sight, Hearing, Speech Sight Info: Adequate Hearing Info: Adequate Speech Info: Adequate    SPECIAL CARE FACTORS FREQUENCY                       Contractures      Additional Factors Info  Code Status, Allergies Code Status Info: DNR Allergies Info: Cymbalta           Current Medications (10/20/2019):  This is the current hospital active medication list Current Facility-Administered Medications  Medication Dose Route Frequency Provider Last Rate Last Dose  .  acetaminophen (TYLENOL) tablet 650 mg  650 mg Oral Q6H PRN Emokpae, Ejiroghene E, MD       Or  . acetaminophen (TYLENOL) suppository 650 mg  650 mg Rectal Q6H PRN Emokpae, Ejiroghene E, MD      . apixaban (ELIQUIS) tablet 2.5 mg  2.5 mg Oral BID Manuella Ghazi, Pratik D, DO   2.5 mg at 10/20/19 0924  . Chlorhexidine Gluconate Cloth 2 % PADS 6 each  6 each Topical Daily Emokpae, Ejiroghene E, MD   6 each at 10/20/19  0925  . dextrose 5 % with KCl 20 mEq / L  infusion  20 mEq Intravenous Continuous Reubin Milan, MD 50 mL/hr at 10/19/19 1858 20 mEq at 10/19/19 1858  . docusate sodium (COLACE) capsule 100 mg  100 mg Oral BID PRN Basilio Cairo, NP      . MEDLINE mouth rinse  15 mL Mouth Rinse BID Emokpae, Ejiroghene E, MD   15 mL at 10/20/19 0925  . metoprolol tartrate (LOPRESSOR) injection 5 mg  5 mg Intravenous Q6H PRN Reubin Milan, MD   5 mg at 10/19/19 0610  . metoprolol tartrate (LOPRESSOR) tablet 50 mg  50 mg Oral BID Arnoldo Lenis, MD   50 mg at 10/20/19 0929  . ondansetron (ZOFRAN) tablet 4 mg  4 mg Oral Q6H PRN Emokpae, Ejiroghene E, MD       Or  . ondansetron (ZOFRAN) injection 4 mg  4 mg Intravenous Q6H PRN Emokpae, Ejiroghene E, MD      . oxyCODONE (OXYCONTIN) 12 hr tablet 10 mg  10 mg Oral Q12H Shah, Pratik D, DO   10 mg at 10/20/19 0924  . tiZANidine (ZANAFLEX) tablet 4 mg  4 mg Oral Q6H PRN Manuella Ghazi, Pratik D, DO   4 mg at 10/20/19 [id number]     Discharge Medications:   Medication List    STOP taking these medications   amLODipine 5 MG tablet Commonly known as: NORVASC   cephALEXin 250 MG capsule Commonly known as: Keflex   digoxin 0.25 MG tablet Commonly known as: LANOXIN   diltiazem 240 MG 24 hr capsule Commonly known as: CARDIZEM CD   Eliquis 2.5 MG Tabs tablet Generic drug: apixaban   Imodium A-D 2 MG tablet Generic drug: loperamide   lubiprostone 24 MCG capsule Commonly known as: AMITIZA   ondansetron 4 MG disintegrating tablet Commonly known as: ZOFRAN-ODT     TAKE these medications   acetaminophen 325 MG tablet Commonly known as: TYLENOL Take 650 mg by mouth 4 (four) times daily.   docusate sodium 100 MG capsule Commonly known as: COLACE Take 100 mg by mouth 2 (two) times daily as needed for constipation.   metoprolol tartrate 50 MG tablet Commonly known as: LOPRESSOR Take 1 tablet (50 mg total) by mouth 2 (two) times daily.    multivitamin with minerals tablet Take 1 tablet by mouth daily.   oxyCODONE 15 mg 12 hr tablet Commonly known as: OxyCONTIN Take 1 tablet (15 mg total) by mouth every 12 (twelve) hours for 10 days.   pantoprazole 20 MG tablet Commonly known as: Protonix Take 1 tablet (20 mg total) by mouth daily.   tiZANidine 4 MG tablet Commonly known as: ZANAFLEX Take 1 tablet (4 mg total) by mouth every 6 (six) hours as needed for muscle spasms.          Relevant Imaging Results:  Relevant Lab Results:   Additional Information SSN [id number]  Jillianna Stanek, Chauncey Reading, RN

## 2019-10-22 LAB — CULTURE, BLOOD (ROUTINE X 2)
Culture: NO GROWTH
Culture: NO GROWTH
Special Requests: ADEQUATE
Special Requests: ADEQUATE

## 2019-10-28 ENCOUNTER — Other Ambulatory Visit: Payer: Self-pay | Admitting: Physician Assistant

## 2019-10-28 ENCOUNTER — Telehealth: Payer: Self-pay | Admitting: Family Medicine

## 2019-10-28 MED ORDER — AZITHROMYCIN 250 MG PO TABS: ORAL_TABLET | ORAL | 0 refills | Status: AC

## 2019-10-28 MED ORDER — GUAIFENESIN ER 600 MG PO TB12: 600.0000 mg | ORAL_TABLET | Freq: Two times a day (BID) | ORAL | 0 refills | Status: AC

## 2019-10-28 NOTE — Telephone Encounter (Signed)
Out break at [facility] point they are asking for Mucenix and maybe abx called in. Patient congestion and nasal drainage.

## 2019-10-28 NOTE — Telephone Encounter (Signed)
No she is at [facility] and they have [place].

## 2019-10-28 NOTE — Telephone Encounter (Signed)
She was just d/c from hospital and supposed to be on palliative services. So just checking on medications/pain

## 2019-10-28 NOTE — Telephone Encounter (Signed)
Oh I didn't read that part. They did say she felt terrible and isn't up moving/ambulating at all. They didn't say anything about pain.

## 2019-10-28 NOTE — Telephone Encounter (Signed)
Spoke to [place] and she says pt had 02 sats in low 80's so they put her on oxygen 2L nasal [place] and 02 sats with oxygen is 92%.

## 2019-10-28 NOTE — Telephone Encounter (Signed)
Has she been placed on palliative services? I can send order for mucinex and zpak

## 2019-10-28 NOTE — Progress Notes (Signed)
z-pak 

## 2019-10-31 ENCOUNTER — Ambulatory Visit: Payer: Medicare Other | Admitting: Physician Assistant

## 2019-11-03 ENCOUNTER — Telehealth: Payer: Self-pay

## 2019-11-03 NOTE — Telephone Encounter (Signed)
Betty Carpenter from [employer]. Called to get additional phone number. Gave numbers on file.  [place]

## 2019-11-18 DEATH — deceased

## 2019-11-28 ENCOUNTER — Ambulatory Visit: Payer: Medicare Other | Admitting: Physician Assistant

## 2020-03-21 ENCOUNTER — Ambulatory Visit: Payer: Medicare Other | Admitting: Orthopedic Surgery

## 2021-09-10 IMAGING — CT CT HEAD W/O CM
2 series · 15 of 37 positions shown, 18 images · non-contrast
Comparison: 06/28/2019

CLINICAL DATA: Altered mental status

EXAM:
CT HEAD WITHOUT CONTRAST
TECHNIQUE: Contiguous axial images were obtained from the base of the skull
through the vertex without intravenous contrast.

[Series 2: head w o · axial · 0.47mm/px · z∈[+22,+157]mm · 12 of 33 slices shown, 15 images]
[im 3/33  brain]
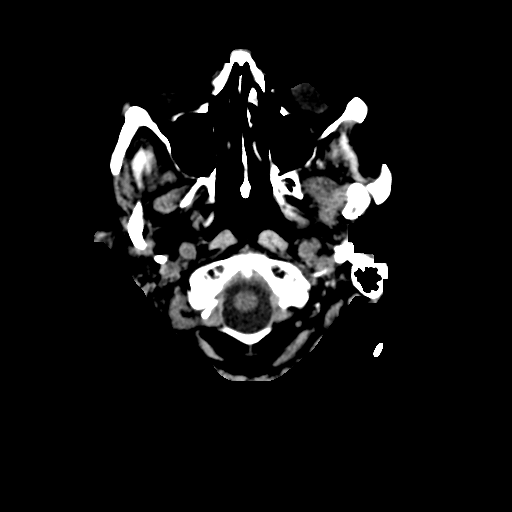
[im 3/33  bone]
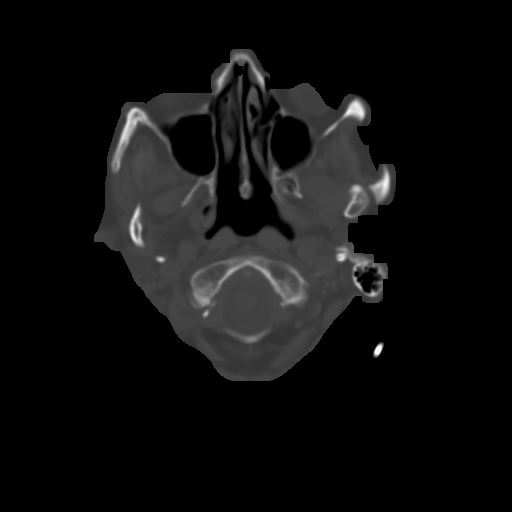
[im 5/33  brain]
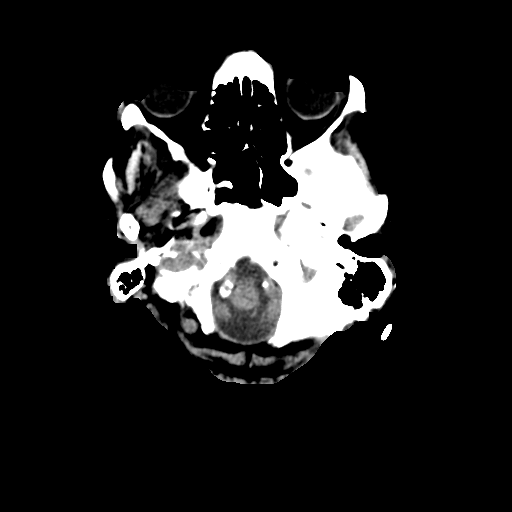
[im 7/33  brain]
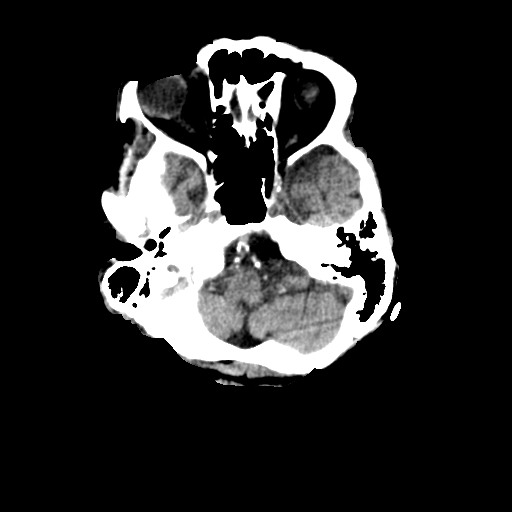
[im 10/33  brain]
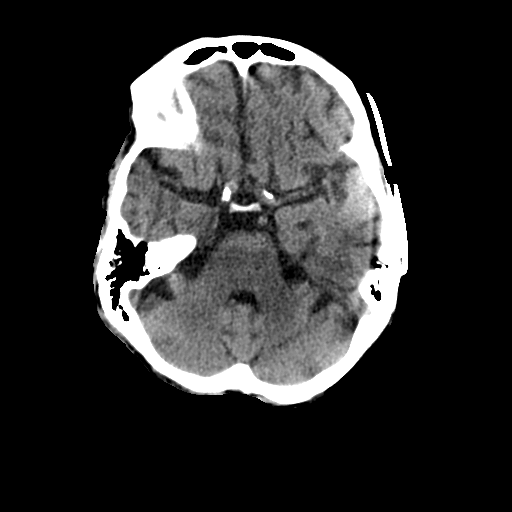
[im 13/33  brain]
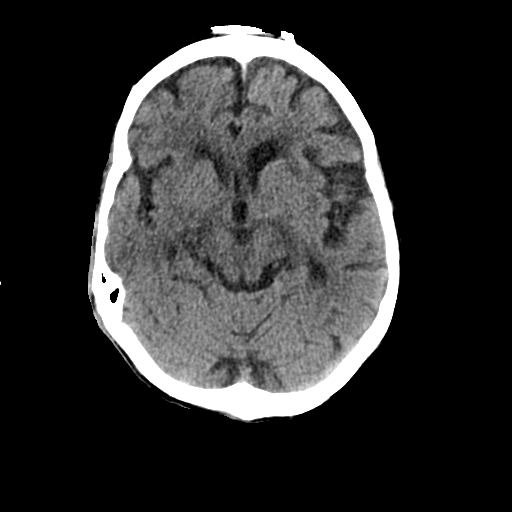
[im 13/33  bone]
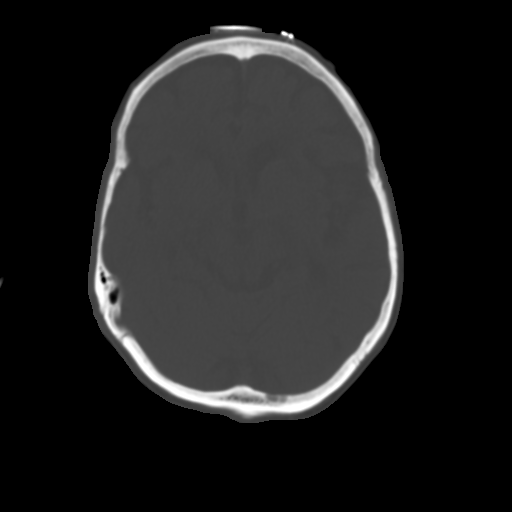
[im 15/33  brain]
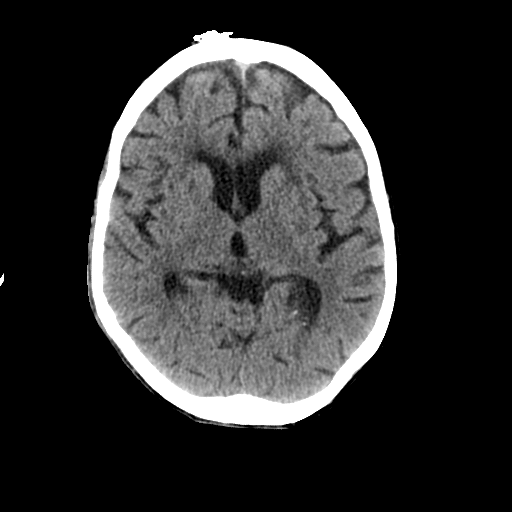
[im 18/33  brain]
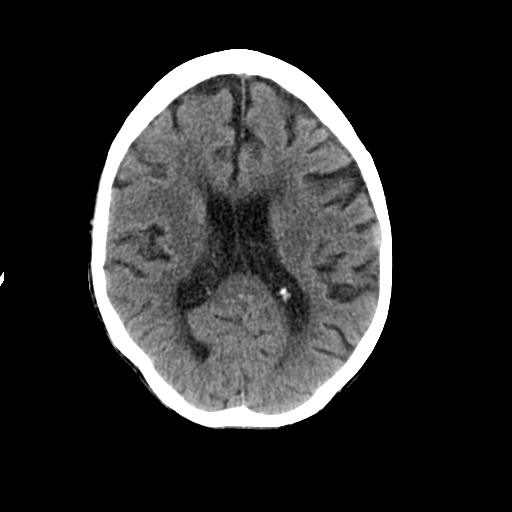
[im 20/33  brain]
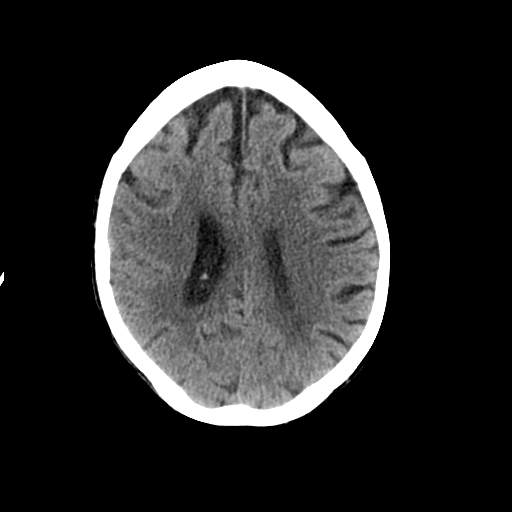
[im 23/33  brain]
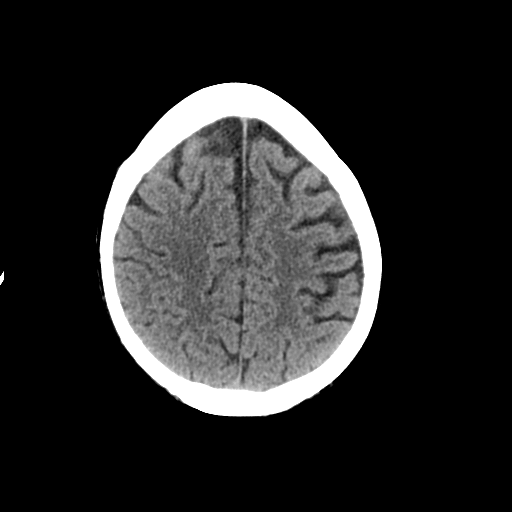
[im 23/33  bone]
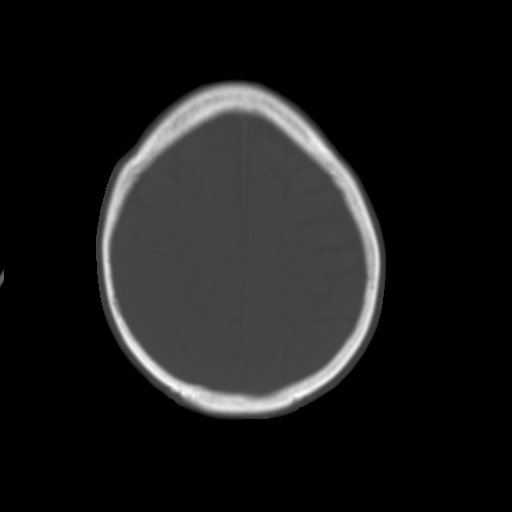
[im 26/33  brain]
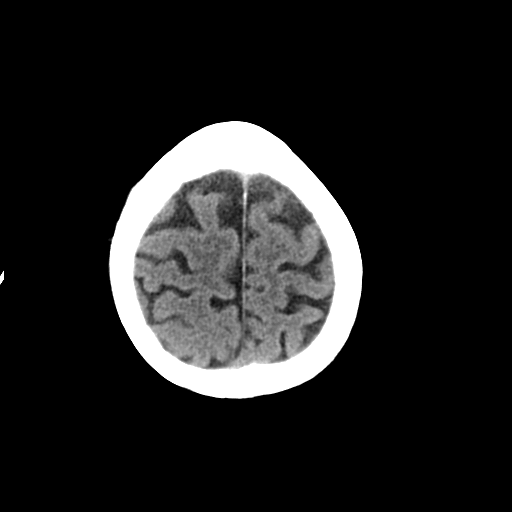
[im 28/33  brain]
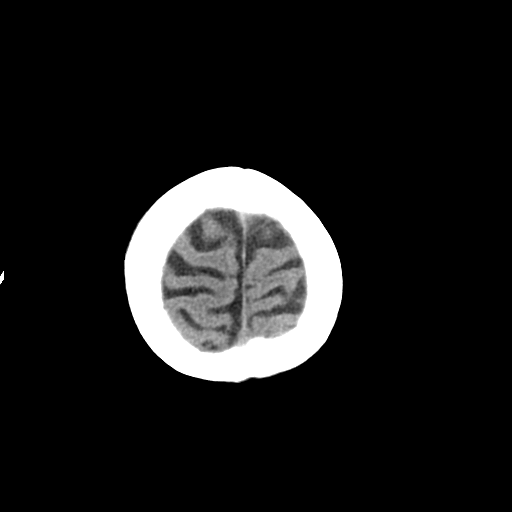
[im 30/33  brain]
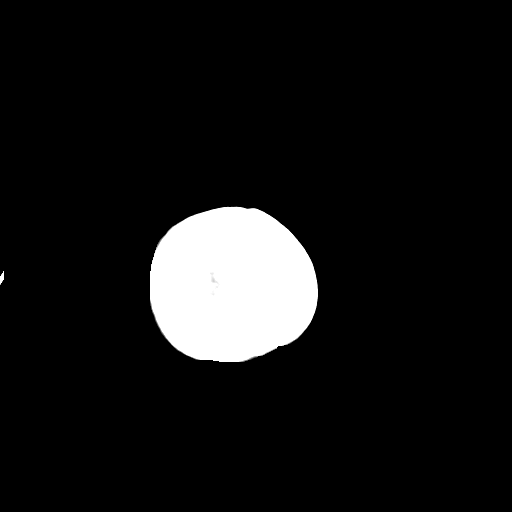

[Series 5: sagittal soft · sagittal · 0.32mm/px · 3 of 51 slices shown]
[im 17/51  brain]
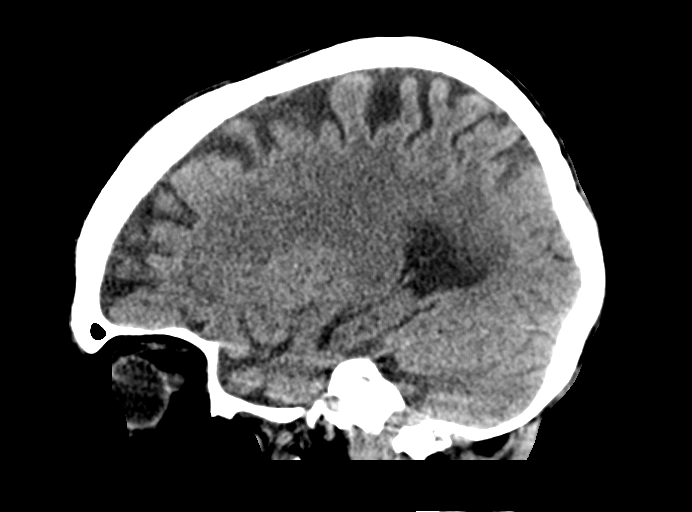
[im 26/51  brain]
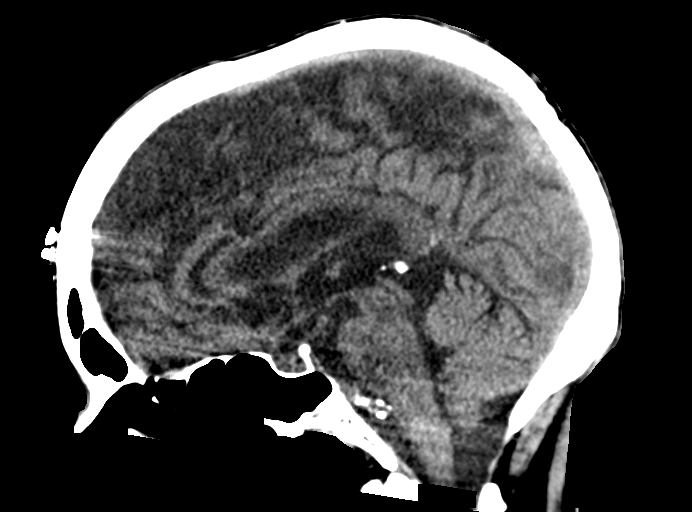
[im 34/51  brain]
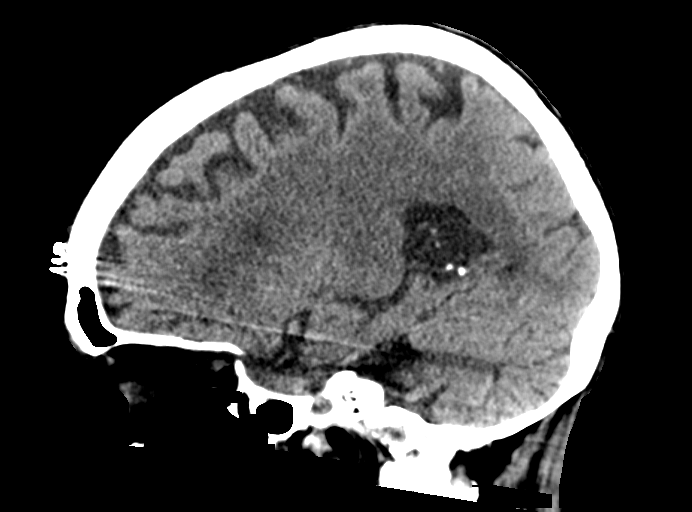

[15 of 37 positions shown; findings below may reference images not displayed]

FINDINGS: Brain: There is atrophy and chronic small vessel disease changes. No
acute intracranial abnormality. Specifically, no hemorrhage,
hydrocephalus, mass lesion, acute infarction, or significant
intracranial injury.

Vascular: No hyperdense vessel or unexpected calcification.

Skull: No acute calvarial abnormality.

Sinuses/Orbits: Visualized paranasal sinuses and mastoids clear.
Orbital soft tissues unremarkable.

Other: None
IMPRESSION: Atrophy, chronic microvascular disease.

No acute intracranial abnormality.
